# Patient Record
Sex: Female | Born: 1955 | Race: White | Hispanic: No | Smoking: Never smoker
Health system: Southern US, Community
[De-identification: ages and names within clinical notes are randomized; demographics above are authoritative.]

## PROBLEM LIST (undated history)

## (undated) DIAGNOSIS — G4700 Insomnia, unspecified: Secondary | ICD-10-CM

## (undated) DIAGNOSIS — D239 Other benign neoplasm of skin, unspecified: Secondary | ICD-10-CM

## (undated) DIAGNOSIS — N809 Endometriosis, unspecified: Secondary | ICD-10-CM

## (undated) DIAGNOSIS — F1010 Alcohol abuse, uncomplicated: Secondary | ICD-10-CM

## (undated) DIAGNOSIS — F329 Major depressive disorder, single episode, unspecified: Secondary | ICD-10-CM

## (undated) DIAGNOSIS — O24419 Gestational diabetes mellitus in pregnancy, unspecified control: Secondary | ICD-10-CM

## (undated) DIAGNOSIS — F32A Depression, unspecified: Secondary | ICD-10-CM

## (undated) DIAGNOSIS — K219 Gastro-esophageal reflux disease without esophagitis: Secondary | ICD-10-CM

## (undated) DIAGNOSIS — T8859XA Other complications of anesthesia, initial encounter: Secondary | ICD-10-CM

## (undated) DIAGNOSIS — F419 Anxiety disorder, unspecified: Secondary | ICD-10-CM

## (undated) HISTORY — PX: OOPHORECTOMY: SHX86

## (undated) HISTORY — DX: Major depressive disorder, single episode, unspecified: F32.9

## (undated) HISTORY — DX: Anxiety disorder, unspecified: F41.9

## (undated) HISTORY — DX: Depression, unspecified: F32.A

## (undated) HISTORY — DX: Other benign neoplasm of skin, unspecified: D23.9

## (undated) HISTORY — DX: Endometriosis, unspecified: N80.9

## (undated) HISTORY — PX: OTHER SURGICAL HISTORY: SHX169

## (undated) HISTORY — PX: RHINOPLASTY: SUR1284

## (undated) HISTORY — PX: GASTRIC BYPASS: SHX52

---

## 2007-06-04 ENCOUNTER — Ambulatory Visit: Payer: Self-pay | Admitting: Family Medicine

## 2009-02-09 ENCOUNTER — Ambulatory Visit: Payer: Self-pay | Admitting: Family Medicine

## 2009-04-25 ENCOUNTER — Ambulatory Visit: Payer: Self-pay | Admitting: Gastroenterology

## 2013-01-08 ENCOUNTER — Ambulatory Visit: Payer: Self-pay | Admitting: Neurology

## 2013-02-05 DIAGNOSIS — Z005 Encounter for examination of potential donor of organ and tissue: Secondary | ICD-10-CM | POA: Insufficient documentation

## 2013-02-05 DIAGNOSIS — Z9884 Bariatric surgery status: Secondary | ICD-10-CM | POA: Insufficient documentation

## 2013-02-05 DIAGNOSIS — Z01818 Encounter for other preprocedural examination: Secondary | ICD-10-CM | POA: Insufficient documentation

## 2014-04-27 ENCOUNTER — Ambulatory Visit: Payer: Self-pay | Admitting: Family Medicine

## 2015-04-24 ENCOUNTER — Emergency Department
Admission: EM | Admit: 2015-04-24 | Discharge: 2015-04-24 | Disposition: A | Discharge status: Home / Self Care | Payer: BC Managed Care – PPO | Attending: Emergency Medicine | Admitting: Emergency Medicine

## 2015-04-24 ENCOUNTER — Emergency Department: Payer: BC Managed Care – PPO

## 2015-04-24 ENCOUNTER — Encounter: Payer: Self-pay | Admitting: Emergency Medicine

## 2015-04-24 DIAGNOSIS — Y998 Other external cause status: Secondary | ICD-10-CM | POA: Insufficient documentation

## 2015-04-24 DIAGNOSIS — S0012XA Contusion of left eyelid and periocular area, initial encounter: Secondary | ICD-10-CM | POA: Diagnosis not present

## 2015-04-24 DIAGNOSIS — S060X0A Concussion without loss of consciousness, initial encounter: Secondary | ICD-10-CM

## 2015-04-24 DIAGNOSIS — Y92090 Kitchen in other non-institutional residence as the place of occurrence of the external cause: Secondary | ICD-10-CM | POA: Insufficient documentation

## 2015-04-24 DIAGNOSIS — Y9389 Activity, other specified: Secondary | ICD-10-CM | POA: Insufficient documentation

## 2015-04-24 DIAGNOSIS — W01198A Fall on same level from slipping, tripping and stumbling with subsequent striking against other object, initial encounter: Secondary | ICD-10-CM | POA: Diagnosis not present

## 2015-04-24 DIAGNOSIS — S0083XA Contusion of other part of head, initial encounter: Secondary | ICD-10-CM | POA: Diagnosis not present

## 2015-04-24 DIAGNOSIS — S0990XA Unspecified injury of head, initial encounter: Secondary | ICD-10-CM | POA: Diagnosis present

## 2015-04-24 HISTORY — DX: Insomnia, unspecified: G47.00

## 2015-04-24 NOTE — Discharge Instructions (Signed)
You evaluated after head injury with swelling to the eyelid, and found to have symptoms of a mild concussion as well as bruising to the face. He may take over-the-counter ibuprofen 600 mg every 8 hours as needed for headache and swelling.  Return to the emergency department for any new or worsening condition including any confusion, altered mental status, weakness, numbness, worsening pain, or vision problems.   Contusion A contusion is a deep bruise. Contusions are the result of a blunt injury to tissues and muscle fibers under the skin. The injury causes bleeding under the skin. The skin overlying the contusion may turn blue, purple, or yellow. Minor injuries will give you a painless contusion, but more severe contusions may stay painful and swollen for a few weeks.  CAUSES  This condition is usually caused by a blow, trauma, or direct force to an area of the body. SYMPTOMS  Symptoms of this condition include:  Swelling of the injured area.  Pain and tenderness in the injured area.  Discoloration. The area may have redness and then turn blue, purple, or yellow. DIAGNOSIS  This condition is diagnosed based on a physical exam and medical history. An X-ray, CT scan, or MRI may be needed to determine if there are any associated injuries, such as broken bones (fractures). TREATMENT  Specific treatment for this condition depends on what area of the body was injured. In general, the best treatment for a contusion is resting, icing, applying pressure to (compression), and elevating the injured area. This is often called the RICE strategy. Over-the-counter anti-inflammatory medicines may also be recommended for pain control.  HOME CARE INSTRUCTIONS   Rest the injured area.  If directed, apply ice to the injured area:  Put ice in a plastic bag.  Place a towel between your skin and the bag.  Leave the ice on for 20 minutes, 2-3 times per day.  If directed, apply light compression to the  injured area using an elastic bandage. Make sure the bandage is not wrapped too tightly. Remove and reapply the bandage as directed by your health care provider.  If possible, raise (elevate) the injured area above the level of your heart while you are sitting or lying down.  Take over-the-counter and prescription medicines only as told by your health care provider. SEEK MEDICAL CARE IF:  Your symptoms do not improve after several days of treatment.  Your symptoms get worse.  You have difficulty moving the injured area. SEEK IMMEDIATE MEDICAL CARE IF:   You have severe pain.  You have numbness in a hand or foot.  Your hand or foot turns pale or cold.   This information is not intended to replace advice given to you by your health care provider. Make sure you discuss any questions you have with your health care provider.   Document Released: 04/04/2005 Document Revised: 03/16/2015 Document Reviewed: 11/10/2014 Elsevier Interactive Patient Education 2016 [place], Adult A concussion is a brain injury. It is caused by:  A hit to the head.  A quick and sudden movement (jolt) of the head or neck. A concussion is usually not life threatening. Even so, it can cause serious problems. If you had a concussion before, you may have concussion-like problems after a hit to your head. HOME CARE General Instructions  Follow your doctor's directions carefully.  Take medicines only as told by your doctor.  Only take medicines your doctor says are safe.  Do not drink alcohol until your doctor says  it is okay. Alcohol and some drugs can slow down healing. They can also put you at risk for further injury.  If you are having trouble remembering things, write them down.  Try to do one thing at a time if you get distracted easily. For example, do not watch TV while making dinner.  Talk to your family members or close friends when making important decisions.  Follow up with  your doctor as told.  Watch your symptoms. Tell others to do the same. Serious problems can sometimes happen after a concussion. Older adults are more likely to have these problems.  Tell your teachers, school nurse, school counselor, coach, Product/process development scientist, or work Freight forwarder about your concussion. Tell them about what you can or cannot do. They should watch to see if:  It gets even harder for you to pay attention or concentrate.  It gets even harder for you to remember things or learn new things.  You need more time than normal to finish things.  You become annoyed (irritable) more than before.  You are not able to deal with stress as well.  You have more problems than before.  Rest. Make sure you:  Get plenty of sleep at night.  Go to sleep early.  Go to bed at the same time every day. Try to wake up at the same time.  Rest during the day.  Take naps when you feel tired.  Limit activities where you have to think a lot or concentrate. These include:  Doing homework.  Doing work related to a job.  Watching TV.  Using the computer. Returning To Your Regular Activities Return to your normal activities slowly, not all at once. You must give your body and brain enough time to heal.   Do not play sports or do other athletic activities until your doctor says it is okay.  Ask your doctor when you can drive, ride a bicycle, or work other vehicles or machines. Never do these things if you feel dizzy.  Ask your doctor about when you can return to work or school. Preventing Another Concussion It is very important to avoid another brain injury, especially before you have healed. In rare cases, another injury can lead to permanent brain damage, brain swelling, or death. The risk of this is greatest during the first 7-10 days after your injury. Avoid injuries by:   Wearing a seat belt when riding in a car.  Not drinking too much alcohol.  Avoiding activities that could lead to  a second concussion (such as contact sports).  Wearing a helmet when doing activities like:  Biking.  Skiing.  Skateboarding.  Skating.  Making your home safer by:  Removing things from the floor or stairways that could make you trip.  Using grab bars in bathrooms and handrails by stairs.  Placing non-slip mats on floors and in bathtubs.  Improve lighting in dark areas. GET HELP IF:  It gets even harder for you to pay attention or concentrate.  It gets even harder for you to remember things or learn new things.  You need more time than normal to finish things.  You become annoyed (irritable) more than before.  You are not able to deal with stress as well.  You have more problems than before.  You have problems keeping your balance.  You are not able to react quickly when you should. Get help if you have any of these problems for more than 2 weeks:   Lasting (chronic)  headaches.  Dizziness or trouble balancing.  Feeling sick to your stomach (nausea).  Seeing (vision) problems.  Being affected by noises or light more than normal.  Feeling sad, low, down in the dumps, blue, gloomy, or empty (depressed).  Mood changes (mood swings).  Feeling of fear or nervousness about what may happen (anxiety).  Feeling annoyed.  Memory problems.  Problems concentrating or paying attention.  Sleep problems.  Feeling tired all the time. GET HELP RIGHT AWAY IF:   You have bad headaches or your headaches get worse.  You have weakness (even if it is in one hand, leg, or part of the face).  You have loss of feeling (numbness).  You feel off balance.  You keep throwing up (vomiting).  You feel tired.  One black center of your eye (pupil) is larger than the other.  You twitch or shake violently (convulse).  Your speech is not clear (slurred).  You are more confused, easily angered (agitated), or annoyed than before.  You have more trouble resting than  before.  You are unable to recognize people or places.  You have neck pain.  It is difficult to wake you up.  You have unusual behavior changes.  You pass out (lose consciousness). MAKE SURE YOU:   Understand these instructions.  Will watch your condition.  Will get help right away if you are not doing well or get worse.   This information is not intended to replace advice given to you by your health care provider. Make sure you discuss any questions you have with your health care provider.   Document Released: 06/13/2009 Document Revised: 07/16/2014 Document Reviewed: 01/15/2013 Elsevier Interactive Patient Education [employer].

## 2015-04-24 NOTE — ED Provider Notes (Signed)
[facility] Emergency Department Provider Note   ____________________________________________  Time seen: On arrival to ED room I have reviewed the triage vital signs and the triage nursing note.  HISTORY  Chief Complaint Dizziness   Historian Patient  HPI Gabrielle Malone is a 59 y.o. female who is here for evaluation of head/facial injury which occurred last night overnight. Patient reports she slipped on a wet floor in the kitchen and struck her face on to a cabinet. She thinks that she blacked out for a few seconds. She states she went to sleep after looking in the mirror and things looked okay. When she woke up she states that she's been feeling generalized weakness all day long, and can't open her eyelid due to swelling. She states she had a headache as well as dizziness. She statesthat she's felt "off balance."    Past Medical History  Diagnosis Date  . Insomnia     There are no active problems to display for this patient.   Past Surgical History  Procedure Laterality Date  . Gastric bypass    . Cesarean section    . Rhinoplasty    . Carpal tunnel    . Oophorectomy      No current outpatient prescriptions on file.  Allergies Review of patient's allergies indicates no known allergies.  No family history on file.  Social History Social History  Substance Use Topics  . Smoking status: Never Smoker   . Smokeless tobacco: Never Used  . Alcohol Use: Yes     Comment: occas.    Review of Systems  Constitutional: Negative for fever. Eyes:questionable blurry vision. ENT: Negative for sore throat. Cardiovascular: Negative for chest pain. Respiratory: Negative for shortness of breath. Gastrointestinal: Negative for abdominal pain, vomiting and diarrhea. Genitourinary: Negative for dysuria. Musculoskeletal: Negative for back pain. Skin: Negative for rash. Neurological: as per history of present illness. 10 point Review of Systems  otherwise negative ____________________________________________   PHYSICAL EXAM:  VITAL SIGNS: ED Triage Vitals  Enc Vitals Group     BP 04/24/15 1558 149/92 mmHg     Pulse Rate 04/24/15 1558 93     Resp 04/24/15 1558 18     Temp 04/24/15 1558 98.2 F (36.8 C)     Temp Source 04/24/15 1558 Oral     SpO2 04/24/15 1558 99 %     Weight 04/24/15 1558 140 lb (63.504 kg)     Height 04/24/15 1558 5' 1.5" (1.562 m)     Head Cir --      Peak Flow --      Pain Score 04/24/15 1559 2     Pain Loc --      Pain Edu? --      Excl. in [place]? --      Constitutional: Alert and oriented. Well appearing and in no distress. Eyes: Conjunctivae are normal. PERRL. Normal extraocular movements. ENT   Head: hematoma and ecchymosis at the left eyebrow margin with large ecchymosis into the left eyelid, precluding her from opening on her own. no pain on palpation of the facial bones.   Nose: No congestion/rhinnorhea.   Mouth/Throat: Mucous membranes are moist.   Neck: No stridor.no midline C-spine tenderness. Cardiovascular/Chest: Normal rate, regular rhythm.  No murmurs, rubs, or gallops. Respiratory: Normal respiratory effort without tachypnea nor retractions. Breath sounds are clear and equal bilaterally. No wheezes/rales/rhonchi. Gastrointestinal: Soft. No distention, no guarding, no rebound. Nontender   Genitourinary/rectal:Deferred Musculoskeletal: Nontender with normal range of motion in all  extremities. No joint effusions.  No lower extremity tenderness.  No edema. Neurologic:  Normal speech and language. No gross or focal neurologic deficits are appreciated. Skin:  Skin is warm, dry and intact. No rash noted. Psychiatric: Mood and affect are normal. Speech and behavior are normal. Patient exhibits appropriate insight and judgment.  ____________________________________________   EKG I, Lisa Roca, MD, the attending physician have personally viewed and interpreted all ECGs.  No  EKG performed ____________________________________________  LABS (pertinent positives/negatives)  none  ____________________________________________  RADIOLOGY All Xrays were viewed by me. Imaging interpreted by Radiologist.  CT head noncontrast:   IMPRESSION: No acute intracranial findings. Mild chronic small vessel disease. __________________________________________  PROCEDURES  Procedure(s) performed: None  Critical Care performed: None  ____________________________________________   ED COURSE / ASSESSMENT AND PLAN  CONSULTATIONS: None  Pertinent labs & imaging results that were available during my care of the patient were reviewed by me and considered in my medical decision making (see chart for details).   The ecchymosis has gathered by gravity into the left eyelid, however it appears that the initial injury was on the frontal area of the eyebrow. When I lift her eyelids that she can see, her extremities are intact and she states her vision is fine. I'm not concerned about ocular injury. She is concerned about driving because of her one eye unable to open. I will write her an out of work note so she doesn't have to drive while she cannot open her left eye.  Because of her complaint of feeling off-balance, headache, nausea, after the head injury, we discussed obtaining a CT of the head to rule injured cranial trauma.  CT showed no acute trauma. Her symptoms may be related to mild concussion. Asher follow up with her primary care physician for this.  Patient / Family / Caregiver informed of clinical course, medical decision-making process, and agree with plan.   I discussed return precautions, follow-up instructions, and discharged instructions with patient and/or family.  ___________________________________________   FINAL CLINICAL IMPRESSION(S) / ED DIAGNOSES   Final diagnoses:  Hematoma of face, initial encounter  Concussion, without loss of  consciousness, initial encounter       Lisa Roca, MD 04/24/15 1727

## 2015-04-24 NOTE — ED Notes (Signed)
Pt reports tripping last night on a wet floor. Bruising and swelling over left eye. Pt states she thinks she "blacked out for a while". fall unwitnessed. Pt c/o dizziness and blurry vision in both eyes.

## 2015-08-11 ENCOUNTER — Encounter: Payer: Self-pay | Admitting: Emergency Medicine

## 2015-08-11 ENCOUNTER — Emergency Department: Payer: BC Managed Care – PPO

## 2015-08-11 ENCOUNTER — Emergency Department
Admission: EM | Admit: 2015-08-11 | Discharge: 2015-08-11 | Disposition: A | Discharge status: Home / Self Care | Payer: BC Managed Care – PPO | Attending: Emergency Medicine | Admitting: Emergency Medicine

## 2015-08-11 DIAGNOSIS — Y9389 Activity, other specified: Secondary | ICD-10-CM | POA: Diagnosis not present

## 2015-08-11 DIAGNOSIS — S0990XA Unspecified injury of head, initial encounter: Secondary | ICD-10-CM | POA: Insufficient documentation

## 2015-08-11 DIAGNOSIS — Y92410 Unspecified street and highway as the place of occurrence of the external cause: Secondary | ICD-10-CM | POA: Diagnosis not present

## 2015-08-11 DIAGNOSIS — S3991XA Unspecified injury of abdomen, initial encounter: Secondary | ICD-10-CM | POA: Insufficient documentation

## 2015-08-11 DIAGNOSIS — R55 Syncope and collapse: Secondary | ICD-10-CM | POA: Diagnosis present

## 2015-08-11 DIAGNOSIS — R112 Nausea with vomiting, unspecified: Secondary | ICD-10-CM | POA: Insufficient documentation

## 2015-08-11 DIAGNOSIS — S4992XA Unspecified injury of left shoulder and upper arm, initial encounter: Secondary | ICD-10-CM | POA: Insufficient documentation

## 2015-08-11 DIAGNOSIS — Y998 Other external cause status: Secondary | ICD-10-CM | POA: Insufficient documentation

## 2015-08-11 DIAGNOSIS — S060X0A Concussion without loss of consciousness, initial encounter: Secondary | ICD-10-CM | POA: Diagnosis not present

## 2015-08-11 DIAGNOSIS — S00412A Abrasion of left ear, initial encounter: Secondary | ICD-10-CM

## 2015-08-11 MED ORDER — METHOCARBAMOL 750 MG PO TABS: 750.0000 mg | ORAL_TABLET | Freq: Four times a day (QID) | ORAL | Status: DC

## 2015-08-11 MED ORDER — TRAMADOL HCL 50 MG PO TABS: 50.0000 mg | ORAL_TABLET | Freq: Four times a day (QID) | ORAL | Status: DC | PRN

## 2015-08-11 MED ORDER — IBUPROFEN 800 MG PO TABS: 800.0000 mg | ORAL_TABLET | Freq: Three times a day (TID) | ORAL | Status: DC | PRN
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
## 2015-08-11 NOTE — ED Notes (Signed)
Assessed per PA 

## 2015-08-11 NOTE — Discharge Instructions (Signed)
Concussion, Adult °A concussion is a brain injury. It is caused by: °· A hit to the head. °· A quick and sudden movement (jolt) of the head or neck. °A concussion is usually not life threatening. Even so, it can cause serious problems. If you had a concussion before, you may have concussion-like problems after a hit to your head. °HOME CARE °General Instructions °· Follow your doctor's directions carefully. °· Take medicines only as told by your doctor. °· Only take medicines your doctor says are safe. °· Do not drink alcohol until your doctor says it is okay. Alcohol and some drugs can slow down healing. They can also put you at risk for further injury. °· If you are having trouble remembering things, write them down. °· Try to do one thing at a time if you get distracted easily. For example, do not watch TV while making dinner. °· Talk to your family members or close friends when making important decisions. °· Follow up with your doctor as told. °· Watch your symptoms. Tell others to do the same. Serious problems can sometimes happen after a concussion. Older adults are more likely to have these problems. °· Tell your teachers, school nurse, school counselor, coach, athletic trainer, or work manager about your concussion. Tell them about what you can or cannot do. They should watch to see if: °¨ It gets even harder for you to pay attention or concentrate. °¨ It gets even harder for you to remember things or learn new things. °¨ You need more time than normal to finish things. °¨ You become annoyed (irritable) more than before. °¨ You are not able to deal with stress as well. °¨ You have more problems than before. °· Rest. Make sure you: °¨ Get plenty of sleep at night. °¨ Go to sleep early. °¨ Go to bed at the same time every day. Try to wake up at the same time. °¨ Rest during the day. °¨ Take naps when you feel tired. °· Limit activities where you have to think a lot or concentrate. These include: °¨ Doing  homework. °¨ Doing work related to a job. °¨ Watching TV. °¨ Using the computer. °Returning To Your Regular Activities °Return to your normal activities slowly, not all at once. You must give your body and brain enough time to heal.  °· Do not play sports or do other athletic activities until your doctor says it is okay. °· Ask your doctor when you can drive, ride a bicycle, or work other vehicles or machines. Never do these things if you feel dizzy. °· Ask your doctor about when you can return to work or school. °Preventing Another Concussion °It is very important to avoid another brain injury, especially before you have healed. In rare cases, another injury can lead to permanent brain damage, brain swelling, or death. The risk of this is greatest during the first 7-10 days after your injury. Avoid injuries by:  °· Wearing a seat belt when riding in a car. °· Not drinking too much alcohol. °· Avoiding activities that could lead to a second concussion (such as contact sports). °· Wearing a helmet when doing activities like: °¨ Biking. °¨ Skiing. °¨ Skateboarding. °¨ Skating. °· Making your home safer by: °¨ Removing things from the floor or stairways that could make you trip. °¨ Using grab bars in bathrooms and handrails by stairs. °¨ Placing non-slip mats on floors and in bathtubs. °¨ Improve lighting in dark areas. °GET HELP IF: °·   It gets even harder for you to pay attention or concentrate.  It gets even harder for you to remember things or learn new things.  You need more time than normal to finish things.  You become annoyed (irritable) more than before.  You are not able to deal with stress as well.  You have more problems than before.  You have problems keeping your balance.  You are not able to react quickly when you should. Get help if you have any of these problems for more than 2 weeks:   Lasting (chronic) headaches.  Dizziness or trouble balancing.  Feeling sick to your stomach  (nausea).  Seeing (vision) problems.  Being affected by noises or light more than normal.  Feeling sad, low, down in the dumps, blue, gloomy, or empty (depressed).  Mood changes (mood swings).  Feeling of fear or nervousness about what may happen (anxiety).  Feeling annoyed.  Memory problems.  Problems concentrating or paying attention.  Sleep problems.  Feeling tired all the time. GET HELP RIGHT AWAY IF:   You have bad headaches or your headaches get worse.  You have weakness (even if it is in one hand, leg, or part of the face).  You have loss of feeling (numbness).  You feel off balance.  You keep throwing up (vomiting).  You feel tired.  One black center of your eye (pupil) is larger than the other.  You twitch or shake violently (convulse).  Your speech is not clear (slurred).  You are more confused, easily angered (agitated), or annoyed than before.  You have more trouble resting than before.  You are unable to recognize people or places.  You have neck pain.  It is difficult to wake you up.  You have unusual behavior changes.  You pass out (lose consciousness). MAKE SURE YOU:   Understand these instructions.  Will watch your condition.  Will get help right away if you are not doing well or get worse.   This information is not intended to replace advice given to you by your health care provider. Make sure you discuss any questions you have with your health care provider.   Document Released: 06/13/2009 Document Revised: 07/16/2014 Document Reviewed: 01/15/2013 Elsevier Interactive Patient Education 2016 [place].  Abrasion An abrasion is a cut or scrape on the surface of your skin. An abrasion does not go through all of the layers of your skin. It is important to take good care of your abrasion to prevent infection. HOME CARE Medicines  Take or apply medicines only as told by your doctor.  If you were prescribed an antibiotic  ointment, finish all of it even if you start to feel better. Wound Care  Clean the wound with mild soap and water 2-3 times per day or as told by your doctor. Pat your wound dry with a clean towel. Do not rub it.  There are many ways to close and cover a wound. Follow instructions from your doctor about:  How to take care of your wound.  When and how you should change your bandage (dressing).  When and how you should take off your dressing.  Check your wound every day for signs of infection. Watch for:  Redness, swelling, or pain.  Fluid, blood, or pus. General Instructions  Keep the dressing dry as told by your doctor. Do not take baths, swim, use a hot tub, or do anything that would put your wound underwater until your doctor says it is okay.  If there is swelling, raise (elevate) the injured area above the level of your heart while you are sitting or lying down.  Keep all follow-up visits as told by your doctor. This is important. GET HELP IF:  You were given a tetanus shot and you have any of these where the needle went in:  Swelling.  Very bad pain.  Redness.  Bleeding.  Medicine does not help your pain.  You have any of these at the site of the wound:  More redness.  More swelling.  More pain. GET HELP RIGHT AWAY IF:  You have a red streak going away from your wound.  You have a fever.  You have fluid, blood, or pus coming from your wound.  There is a bad smell coming from your wound.   This information is not intended to replace advice given to you by your health care provider. Make sure you discuss any questions you have with your health care provider.   Document Released: 12/12/2007 Document Revised: 11/09/2014 Document Reviewed: 06/23/2014 Elsevier Interactive Patient Education [employer].

## 2015-09-06 ENCOUNTER — Other Ambulatory Visit: Payer: Self-pay | Admitting: Neurology

## 2015-09-06 DIAGNOSIS — F0781 Postconcussional syndrome: Secondary | ICD-10-CM

## 2015-09-26 ENCOUNTER — Ambulatory Visit: Payer: BC Managed Care – PPO

## 2015-09-30 ENCOUNTER — Ambulatory Visit
Admission: RE | Admit: 2015-09-30 | Discharge: 2015-09-30 | Disposition: A | Discharge status: Home / Self Care | Payer: BC Managed Care – PPO | Source: Ambulatory Visit | Attending: Neurology | Admitting: Neurology

## 2015-09-30 DIAGNOSIS — R9082 White matter disease, unspecified: Secondary | ICD-10-CM | POA: Insufficient documentation

## 2015-09-30 DIAGNOSIS — J320 Chronic maxillary sinusitis: Secondary | ICD-10-CM | POA: Insufficient documentation

## 2015-09-30 DIAGNOSIS — R413 Other amnesia: Secondary | ICD-10-CM | POA: Insufficient documentation

## 2015-09-30 DIAGNOSIS — F0781 Postconcussional syndrome: Secondary | ICD-10-CM | POA: Diagnosis present

## 2016-04-16 ENCOUNTER — Other Ambulatory Visit: Payer: Self-pay | Admitting: Family Medicine

## 2016-04-16 DIAGNOSIS — Z1231 Encounter for screening mammogram for malignant neoplasm of breast: Secondary | ICD-10-CM

## 2016-05-22 ENCOUNTER — Ambulatory Visit: Payer: BC Managed Care – PPO | Attending: Family Medicine

## 2017-04-23 ENCOUNTER — Ambulatory Visit (INDEPENDENT_AMBULATORY_CARE_PROVIDER_SITE_OTHER): Payer: BC Managed Care – PPO | Admitting: Internal Medicine

## 2017-04-23 ENCOUNTER — Encounter: Payer: Self-pay | Admitting: *Deleted

## 2017-04-23 VITALS — BP 126/80 | HR 84 | Resp 16 | Ht 62.0 in | Wt 132.0 lb

## 2017-04-23 DIAGNOSIS — F419 Anxiety disorder, unspecified: Secondary | ICD-10-CM | POA: Insufficient documentation

## 2017-04-23 DIAGNOSIS — N809 Endometriosis, unspecified: Secondary | ICD-10-CM | POA: Insufficient documentation

## 2017-04-23 DIAGNOSIS — R05 Cough: Secondary | ICD-10-CM

## 2017-04-23 DIAGNOSIS — N926 Irregular menstruation, unspecified: Secondary | ICD-10-CM | POA: Insufficient documentation

## 2017-04-23 DIAGNOSIS — J849 Interstitial pulmonary disease, unspecified: Secondary | ICD-10-CM

## 2017-04-23 DIAGNOSIS — F329 Major depressive disorder, single episode, unspecified: Secondary | ICD-10-CM | POA: Insufficient documentation

## 2017-04-23 DIAGNOSIS — IMO0001 Reserved for inherently not codable concepts without codable children: Secondary | ICD-10-CM | POA: Insufficient documentation

## 2017-04-23 DIAGNOSIS — R059 Cough, unspecified: Secondary | ICD-10-CM

## 2017-04-23 DIAGNOSIS — F32A Depression, unspecified: Secondary | ICD-10-CM | POA: Insufficient documentation

## 2017-04-23 DIAGNOSIS — O24419 Gestational diabetes mellitus in pregnancy, unspecified control: Secondary | ICD-10-CM | POA: Insufficient documentation

## 2017-04-23 MED ORDER — PREDNISONE 10 MG (21) PO TBPK: ORAL_TABLET | ORAL | 0 refills | Status: DC

## 2017-04-23 NOTE — Patient Instructions (Addendum)
--  Start omeprazole.   --Will do a trial a prednisone  --CT chest high resolution.   --Pulmonary function test.

## 2017-04-23 NOTE — Progress Notes (Signed)
[facility] Consultation      Assessment and Plan:  61 yo female with chronic cough, CXR shows suggestion of ILD.   Chronic cough.  --chronic dry cough, uncertain etiology.  --Agree with trial of omeprazole, instructed pt to go ahead and start this medication.   ILD-- rule out.  --Suggestion of ILD on CXR, no signs on physical exam.  --Will send for CT high-resolution.   GERD.  --As above, start omeprazole, which may cause ILD.   Orders Placed This Encounter  Procedures  . CT Chest High Resolution  . Pulmonary Function Test [facility] Only   Return in about 3 weeks (around 05/14/2017). (after ordered tests have been completed).     Date: 04/23/2017  MRN# [id number] Gabrielle Malone 01-08-1956  Referring Physician:   Inette Doubrava is a 61 y.o. old female seen in consultation for chief complaint of:    Chief Complaint  Patient presents with  . Advice Only    Referred by Dr. Pryor Ochoa -cough  . Cough    dry cough since May.    HPI:   The patient is a 61 yo female who presents with complaints of persistent cough which has been present for about 4 months. The cough is dry.  The cough come on as a cold and possible pneumonia. She has received 2 courses of abx and neither helped. She was referred to ENT, was recommended to start something for reflux. A CXR was suggested for ILD, then she was referred there.  She notes she gets some chest tightness and fatigue. She has diagnosed with OSA in 1999, a recheck after bariatric surgery showed that her test negative.   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 Her parents both smoked. 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  She does have symptoms of reflux occasionally. She does not have heart problems, she has never been diagnosed with respiratory problems in the past.  Her father had lung cancer. She  has never been a smoker.  She denies sinus drainage, they have 2 dogs.  They sleep in her bed.    02/04/13 CBC; Eos=130. 03/27/01; 7.45/35/114/26.   3/29/16CXR results : IMPRESSION: 1. No evidence of acute cardiopulmonary disease. 2. Obesity noted.  Review of outside records:  Patient was seen by ENT, for persistent cough, chest x-rays showed suggestion of chronic interstitial lung disease. The patient was treated with 2 rounds of oral antibiotics. She was thought to have GERD, and given a course of PPI.   PMHX:   Past Medical History:  Diagnosis Date  . Anxiety   . Depression   . Endometriosis   . Insomnia    Surgical Hx:  Past Surgical History:  Procedure Laterality Date  . carpal tunnel    . CESAREAN SECTION    . GASTRIC BYPASS    . OOPHORECTOMY    . RHINOPLASTY     Family Hx:  Family History  Problem Relation Age of Onset  . Dementia Mother   . Cancer Father        lung,,melanoma  . Dementia Maternal Grandmother    Social Hx:   Social History  Substance Use Topics  . Smoking status: Never Smoker  . Smokeless tobacco: Never Used  . Alcohol use Yes     Comment: occas.   Medication:    Current Outpatient Prescriptions:  .  busPIRone (  BUSPAR) 15 MG tablet, Take 15 mg by mouth 2 (two) times daily., Disp: , Rfl: 1 .  citalopram (CELEXA) 20 MG tablet, Take 20 mg by mouth daily., Disp: , Rfl: 1 .  fluticasone (FLONASE) 50 MCG/ACT nasal spray, Place 2 sprays into both nostrils daily., Disp: , Rfl:  .  LORazepam (ATIVAN) 1 MG tablet, Take 1 mg by mouth daily as needed., Disp: , Rfl: 0 .  Multiple Vitamin (MULTI-VITAMINS) TABS, Take 1 tablet by mouth daily., Disp: , Rfl:  .  omeprazole (PRILOSEC) 40 MG capsule, Take 40 mg by mouth daily., Disp: , Rfl:  .  zolpidem (AMBIEN) 10 MG tablet, Take 10 mg by mouth at bedtime., Disp: , Rfl: 0   Allergies:  Patient has no known allergies.  Review of Systems: Gen:  Denies  fever, sweats, chills HEENT: Denies blurred  vision, double vision. bleeds, sore throat Cvc:  No dizziness, chest pain. Resp:   Denies cough or sputum production, shortness of breath Gi: Denies swallowing difficulty, stomach pain. Gu:  Denies bladder incontinence, burning urine Ext:   No Joint pain, stiffness. Skin: No skin rash,  hives  Endoc:  No polyuria, polydipsia. Psych: No depression, insomnia. Other:  All other systems were reviewed with the patient and were negative other that what is mentioned in the HPI.   Physical Examination:   VS: BP 126/80 (BP Location: Right Arm, Cuff Size: Normal)   Pulse 84   Resp 16   Ht 5\' 2"  (1.575 m)   Wt 132 lb (59.9 kg)   SpO2 96%   BMI 24.14 kg/m   General Appearance: No distress  Neuro:without focal findings,  speech normal,  HEENT: PERRLA, EOM intact.   Pulmonary: Coarse breath sounds, No wheezing.  CardiovascularNormal S1,S2.  No m/r/g.   Abdomen: Benign, Soft, non-tender. Renal:  No costovertebral tenderness  GU:  No performed at this time. Endoc: No evident thyromegaly, no signs of acromegaly. Skin:   warm, no rashes, no ecchymosis  Extremities: normal, no cyanosis, clubbing.  Other findings:    LABORATORY PANEL:   CBC No results for input(s): WBC, HGB, HCT, PLT in the last 168 hours. ------------------------------------------------------------------------------------------------------------------  Chemistries  No results for input(s): NA, K, CL, CO2, GLUCOSE, BUN, CREATININE, CALCIUM, MG, AST, ALT, ALKPHOS, BILITOT in the last 168 hours.  Invalid input(s): GFRCGP ------------------------------------------------------------------------------------------------------------------  Cardiac Enzymes No results for input(s): TROPONINI in the last 168 hours. ------------------------------------------------------------  RADIOLOGY:  No results found.     Thank  you for the consultation and for allowing [facility] to assist in the care of  your patient. Our recommendations are noted above.  Please contact us if we can be of further service.   Marda Stalker, MD.  Board Certified in Internal Medicine, Pulmonary Medicine, [place], and Sleep Medicine.  [facility] Office Number: [phone]  Patricia Pesa, M.D.  Merton Border, M.D  04/23/2017

## 2017-05-09 ENCOUNTER — Ambulatory Visit: Payer: BC Managed Care – PPO | Attending: Internal Medicine

## 2017-05-09 ENCOUNTER — Ambulatory Visit
Admission: RE | Admit: 2017-05-09 | Discharge: 2017-05-09 | Disposition: A | Discharge status: Home / Self Care | Payer: BC Managed Care – PPO | Source: Ambulatory Visit | Attending: Internal Medicine | Admitting: Internal Medicine

## 2017-05-09 DIAGNOSIS — J849 Interstitial pulmonary disease, unspecified: Secondary | ICD-10-CM | POA: Insufficient documentation

## 2017-05-09 DIAGNOSIS — I2510 Atherosclerotic heart disease of native coronary artery without angina pectoris: Secondary | ICD-10-CM | POA: Diagnosis not present

## 2017-05-09 DIAGNOSIS — I700 Atherosclerosis of aorta: Secondary | ICD-10-CM | POA: Diagnosis not present

## 2017-05-09 DIAGNOSIS — R942 Abnormal results of pulmonary function studies: Secondary | ICD-10-CM | POA: Diagnosis not present

## 2017-05-09 DIAGNOSIS — R918 Other nonspecific abnormal finding of lung field: Secondary | ICD-10-CM | POA: Insufficient documentation

## 2017-05-09 DIAGNOSIS — Z9884 Bariatric surgery status: Secondary | ICD-10-CM | POA: Insufficient documentation

## 2017-05-09 DIAGNOSIS — K449 Diaphragmatic hernia without obstruction or gangrene: Secondary | ICD-10-CM | POA: Insufficient documentation

## 2017-05-16 NOTE — Progress Notes (Signed)
[facility] Consultation      Assessment and Plan:  61 yo female with chronic cough.  Chronic cough.  --chronic dry cough, uncertain etiology, however the cough appears to have improved with empiric treatment of Prilosec and Flonase. -Review of CT high-resolution showed no evidence of interstitial lung disease, review of primary function testing showed no evidence of abnormality completely normal pulmonary functions. -We discussed that overall her lungs appear to be normal, and I would recommend treating her cough symptomatically with over-the-counter cough medicines, and continue treatment with omeprazole and Flonase.  We discussed that she could try taking herself off 1 of these medications and see if her cough symptoms worsen. -She was given reassurance today that her lungs are normal and her cough is a symptom but there does not appear to be anything dangerous going on.  We discussed that sometimes last up in this process is undergoing a bronchoscopy which I think it would likely be unrevealing at this point as her CT chest was completely normal and she has no primary smoking history.  If she finds that the cough persists or worsens or she would otherwise like to take this last step she is asked to call us back and I would be happy to proceed with this.  GERD.  --Symptoms improved with empiric treatment of Prilosec. -As above we discussed she could trial of Flonase or Prilosec and see if her cough gets worse if it does then she should go back on. -I also asked her to be mindful about certain foods which may cause her cough to get worse.   Interstitial Lung Disease.  --Review of CT hi-res chest and PFT shows no evidence of ILD.      Date: 05/16/2017  MRN# [id number] Gabrielle Malone 1955-09-17    Gabrielle Malone is a 61 y.o. old female seen in consultation for chief complaint of:    Chief Complaint  Patient presents with  . Follow-up    CT chest & PFT:  SOB  w/activity: dry  cough    Synopsis: The patient presented with complaints of persistent cough which has been present for about 4 months. The cough is dry.  The cough come on as a cold and possible pneumonia. She has received 2 courses of abx and neither helped. She was referred to ENT, was recommended to start something for reflux.  She get a chest x-ray that was suggestive of interstitial lung disease, and was referred here.  She subsequently underwent a high-resolution CT chest which was negative for interstitial lung disease.  At last visit she was sent for a CT high-resolution and pulmonary function test as reviewed below, both of these were normal. She is taking prilosec daily and this helps with the reflux, which is now gone. Her husband is present and notes that the cough is improved but not gone. She also takes flonase  2 sprays in each nostril daily.   Imaging personally reviewed, CT chest 05/09/17; normal lungs, no evidence of interstitial lung disease.  Multiple bilateral tiny nodules, largest is 4 mm.   PFT, 05/09/17. -Spirometry FEV1 is 116% of predicted, FVC is 117% predicted, ratio is 81%.  No bronchodilator was given - TLC is 127% of predicted, ratio is normal.  Flow volume loop is within normal limits. - DLCO 229% of predicted. Overall this test shows normal pulmonary functions mildly increased TLC is suggestive of early emphysematous change, but overall this test is negative for COPD.  02/04/13 CBC; Eos=130. 03/27/01;  7.45/35/114/26.   3/29/16CXR results : IMPRESSION: 1. No evidence of acute cardiopulmonary disease. 2. Obesity noted.    Medication:    Current Outpatient Medications:  .  busPIRone (BUSPAR) 15 MG tablet, Take 15 mg by mouth 2 (two) times daily., Disp: , Rfl: 1 .  citalopram (CELEXA) 20 MG tablet, Take 20 mg by mouth daily., Disp: , Rfl: 1 .  fluticasone (FLONASE) 50 MCG/ACT nasal spray, Place 2 sprays into both nostrils daily., Disp: , Rfl:  .  LORazepam  (ATIVAN) 1 MG tablet, Take 1 mg by mouth daily as needed., Disp: , Rfl: 0 .  Multiple Vitamin (MULTI-VITAMINS) TABS, Take 1 tablet by mouth daily., Disp: , Rfl:  .  omeprazole (PRILOSEC) 40 MG capsule, Take 40 mg by mouth daily., Disp: , Rfl:  .  predniSONE (STERAPRED UNI-PAK 21 TAB) 10 MG (21) TBPK tablet, Take as directed., Disp: 1 tablet, Rfl: 0 .  zolpidem (AMBIEN) 10 MG tablet, Take 10 mg by mouth at bedtime., Disp: , Rfl: 0   Allergies:  Patient has no known allergies.  Review of Systems: Gen:  Denies  fever, sweats, chills HEENT: Denies blurred vision, double vision. bleeds, sore throat Cvc:  No dizziness, chest pain. Resp:   Denies cough or sputum production, shortness of breath Gi: Denies swallowing difficulty, stomach pain. Gu:  Denies bladder incontinence, burning urine Ext:   No Joint pain, stiffness. Skin: No skin rash,  hives  Endoc:  No polyuria, polydipsia. Psych: No depression, insomnia. Other:  All other systems were reviewed with the patient and were negative other that what is mentioned in the HPI.   Physical Examination:   VS: BP 106/80 (BP Location: Left Arm, Cuff Size: Normal)   Pulse 75   Ht 5\' 2"  (1.575 m)   Wt 136 lb (61.7 kg)   SpO2 95%   BMI 24.87 kg/m   General Appearance: No distress  Neuro:without focal findings,  speech normal,  HEENT: PERRLA, EOM intact.   Pulmonary: Coarse breath sounds, No wheezing.  CardiovascularNormal S1,S2.  No m/r/g.   Abdomen: Benign, Soft, non-tender. Renal:  No costovertebral tenderness  GU:  No performed at this time. Endoc: No evident thyromegaly, no signs of acromegaly. Skin:   warm, no rashes, no ecchymosis  Extremities: normal, no cyanosis, clubbing.  Other findings:    LABORATORY PANEL:   CBC No results for input(s): WBC, HGB, HCT, PLT in the last 168 hours. ------------------------------------------------------------------------------------------------------------------  Chemistries  No results  for input(s): NA, K, CL, CO2, GLUCOSE, BUN, CREATININE, CALCIUM, MG, AST, ALT, ALKPHOS, BILITOT in the last 168 hours.  Invalid input(s): GFRCGP ------------------------------------------------------------------------------------------------------------------  Cardiac Enzymes No results for input(s): TROPONINI in the last 168 hours. ------------------------------------------------------------  RADIOLOGY:  No results found.     Thank  you for the consultation and for allowing [facility] to assist in the care of your patient. Our recommendations are noted above.  Please contact us if we can be of further service.   Marda Stalker, MD.  Board Certified in Internal Medicine, Pulmonary Medicine, [place], and Sleep Medicine.  [facility] Office Number: [phone]  Patricia Pesa, M.D.  Merton Border, M.D  05/16/2017

## 2017-05-17 ENCOUNTER — Encounter: Payer: Self-pay | Admitting: Internal Medicine

## 2017-05-17 ENCOUNTER — Ambulatory Visit: Payer: BC Managed Care – PPO | Admitting: Internal Medicine

## 2017-05-17 VITALS — BP 106/80 | HR 75 | Ht 62.0 in | Wt 136.0 lb

## 2017-05-17 DIAGNOSIS — R059 Cough, unspecified: Secondary | ICD-10-CM

## 2017-05-17 DIAGNOSIS — R05 Cough: Secondary | ICD-10-CM

## 2017-05-17 NOTE — Patient Instructions (Signed)
--  Your tests show that your lungs are normal.   --Treat the symptoms of cough as needed with cough medicine.   --If you feel that your cough is getting worse or you would like to pursue other testing let us know and we can consider a bronchoscopy.

## 2017-05-25 ENCOUNTER — Emergency Department: Payer: BC Managed Care – PPO

## 2017-05-25 ENCOUNTER — Emergency Department
Admission: EM | Admit: 2017-05-25 | Discharge: 2017-05-25 | Disposition: A | Discharge status: Home / Self Care | Payer: BC Managed Care – PPO | Attending: Emergency Medicine | Admitting: Emergency Medicine

## 2017-05-25 DIAGNOSIS — Z79899 Other long term (current) drug therapy: Secondary | ICD-10-CM | POA: Diagnosis not present

## 2017-05-25 DIAGNOSIS — Y939 Activity, unspecified: Secondary | ICD-10-CM | POA: Diagnosis not present

## 2017-05-25 DIAGNOSIS — W010XXA Fall on same level from slipping, tripping and stumbling without subsequent striking against object, initial encounter: Secondary | ICD-10-CM | POA: Diagnosis not present

## 2017-05-25 DIAGNOSIS — F10920 Alcohol use, unspecified with intoxication, uncomplicated: Secondary | ICD-10-CM | POA: Diagnosis not present

## 2017-05-25 DIAGNOSIS — S098XXA Other specified injuries of head, initial encounter: Secondary | ICD-10-CM

## 2017-05-25 DIAGNOSIS — S0990XA Unspecified injury of head, initial encounter: Secondary | ICD-10-CM | POA: Diagnosis present

## 2017-05-25 DIAGNOSIS — Y999 Unspecified external cause status: Secondary | ICD-10-CM | POA: Insufficient documentation

## 2017-05-25 DIAGNOSIS — Y92002 Bathroom of unspecified non-institutional (private) residence single-family (private) house as the place of occurrence of the external cause: Secondary | ICD-10-CM | POA: Insufficient documentation

## 2017-05-25 LAB — COMPREHENSIVE METABOLIC PANEL
ALT: 19 U/L (ref 14–54)
AST: 23 U/L (ref 15–41)
Albumin: 4 g/dL (ref 3.5–5.0)
Alkaline Phosphatase: 77 U/L (ref 38–126)
Anion gap: 9 (ref 5–15)
BUN: 16 mg/dL (ref 6–20)
CHLORIDE: 104 mmol/L (ref 101–111)
CO2: 22 mmol/L (ref 22–32)
CREATININE: 0.79 mg/dL (ref 0.44–1.00)
Calcium: 8.9 mg/dL (ref 8.9–10.3)
Glucose, Bld: 90 mg/dL (ref 65–99)
POTASSIUM: 4 mmol/L (ref 3.5–5.1)
Sodium: 135 mmol/L (ref 135–145)
TOTAL PROTEIN: 6.9 g/dL (ref 6.5–8.1)
Total Bilirubin: 0.6 mg/dL (ref 0.3–1.2)

## 2017-05-25 LAB — CBC WITH DIFFERENTIAL/PLATELET
Basophils Absolute: 0.1 10*3/uL (ref 0–0.1)
Basophils Relative: 1 %
EOS ABS: 0.2 10*3/uL (ref 0–0.7)
EOS PCT: 3 %
HCT: 38.7 % (ref 35.0–47.0)
Hemoglobin: 13.3 g/dL (ref 12.0–16.0)
LYMPHS ABS: 1.8 10*3/uL (ref 1.0–3.6)
Lymphocytes Relative: 32 %
MCH: 32.3 pg (ref 26.0–34.0)
MCHC: 34.5 g/dL (ref 32.0–36.0)
MCV: 93.7 fL (ref 80.0–100.0)
MONO ABS: 0.7 10*3/uL (ref 0.2–0.9)
MONOS PCT: 11 %
NEUTROS ABS: 3 10*3/uL (ref 1.4–6.5)
Neutrophils Relative %: 53 %
PLATELETS: 236 10*3/uL (ref 150–440)
RBC: 4.13 MIL/uL (ref 3.80–5.20)
RDW: 14.2 % (ref 11.5–14.5)
WBC: 5.8 10*3/uL (ref 3.6–11.0)

## 2017-05-25 LAB — URINALYSIS, COMPLETE (UACMP) WITH MICROSCOPIC
Bacteria, UA: NONE SEEN
Bilirubin Urine: NEGATIVE
GLUCOSE, UA: NEGATIVE mg/dL
Hgb urine dipstick: NEGATIVE
Ketones, ur: NEGATIVE mg/dL
Leukocytes, UA: NEGATIVE
Nitrite: NEGATIVE
PH: 5 (ref 5.0–8.0)
PROTEIN: NEGATIVE mg/dL
SPECIFIC GRAVITY, URINE: 1.003 — AB (ref 1.005–1.030)
Squamous Epithelial / LPF: NONE SEEN

## 2017-05-25 LAB — URINE DRUG SCREEN, QUALITATIVE (ARMC ONLY)
Amphetamines, Ur Screen: NOT DETECTED
BARBITURATES, UR SCREEN: NOT DETECTED
Benzodiazepine, Ur Scrn: NOT DETECTED
CANNABINOID 50 NG, UR ~~LOC~~: NOT DETECTED
COCAINE METABOLITE, UR ~~LOC~~: NOT DETECTED
MDMA (ECSTASY) UR SCREEN: NOT DETECTED
METHADONE SCREEN, URINE: NOT DETECTED
OPIATE, UR SCREEN: NOT DETECTED
PHENCYCLIDINE (PCP) UR S: NOT DETECTED
TRICYCLIC, UR SCREEN: NOT DETECTED

## 2017-05-25 LAB — SALICYLATE LEVEL

## 2017-05-25 LAB — ETHANOL: ALCOHOL ETHYL (B): 219 mg/dL — AB (ref <10)

## 2017-05-25 LAB — ACETAMINOPHEN LEVEL: Acetaminophen (Tylenol), Serum: <10 ug/mL — ABNORMAL LOW (ref 10–30)

## 2017-05-25 NOTE — ED Notes (Signed)
Pt assisted to use bathroom, placed back in bed on cardiac monitor, husband at bedside and pt given warm blanket.

## 2017-05-25 NOTE — ED Notes (Signed)
Pt awakes easily by this rn. Dr. Joni Fears in to assess and discuss discharge with family and pt. Pt denies pain currently. Daughter and husband educated regarding s/s of head injury and s/s of need to return. Pt ambulatory to wheelchair.

## 2017-05-25 NOTE — ED Triage Notes (Addendum)
Patient had unwitnessed fall today. Spouse heard fall, did not see it. Patient has ETOH and possible nighttime sleeping pills on board. Patient continually falling asleep/falling forward during triage. Patient has laceration below hairline with moderate amount of dried blood in hair.

## 2017-05-25 NOTE — ED Provider Notes (Signed)
[facility] Emergency Department Provider Note  ____________________________________________  Time seen: Approximately 10:18 PM  I have reviewed the triage vital signs and the nursing notes.   HISTORY  Chief Complaint Head Injury and Fall (unwitnessed)  Level 5 Caveat: Portions of the History and Physical are unable to be obtained due to patient being a poor historian due to alcohol intoxication   HPI Gabrielle Malone is a 61 y.o. female brought to the ED due to a fall. Per family the patient and been drinking alcohol this evening and took her nighttime sleeping pills. She was in the bathroom and then lost her balance and fell. The patient states that she feels fine. She has some bleeding from the top of her scalp. Denies loss of consciousness neck pain and paresthesias or weakness. No preceding symptoms causing the fall. She feels that she just lost her balance.     Past Medical History:  Diagnosis Date  . Anxiety   . Depression   . Endometriosis   . Insomnia      Patient Active Problem List   Diagnosis Date Noted  . Anxiety 04/23/2017  . Depression 04/23/2017  . Gestational diabetes 04/23/2017  . Endometriosis 04/23/2017  . Menstrual problem 04/23/2017  . Status post normal childbirth 04/23/2017  . Physical exam, organ donor 02/05/2013  . Pre-op evaluation 02/05/2013  . 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     Past Surgical History:  Procedure Laterality Date  . carpal tunnel    . CESAREAN SECTION    . GASTRIC BYPASS    . OOPHORECTOMY    . RHINOPLASTY       Prior to Admission medications   Medication Sig Start Date End Date Taking? Authorizing Provider  busPIRone (BUSPAR) 15 MG tablet Take 15 mg by mouth 2 (two) times daily. 04/05/17   [provider]  citalopram (CELEXA) 20 MG tablet Take 20 mg by mouth daily. 04/05/17   [provider]  fluticasone (FLONASE) 50 MCG/ACT nasal spray Place 2 sprays into both nostrils daily.  04/20/17   [provider]  LORazepam (ATIVAN) 1 MG tablet Take 1 mg by mouth daily as needed. 04/08/17   [provider]  Multiple Vitamin (MULTI-VITAMINS) TABS Take 1 tablet by mouth daily.    [provider]  omeprazole (PRILOSEC) 40 MG capsule Take 40 mg by mouth daily. 04/20/17   [provider]  zolpidem (AMBIEN) 10 MG tablet Take 10 mg by mouth at bedtime. 04/08/17   [provider]     Allergies Patient has no known allergies.   Family History  Problem Relation Age of Onset  . Dementia Mother   . Cancer Father        lung,,melanoma  . Dementia Maternal Grandmother     Social History Social History   Tobacco Use  . Smoking status: Never Smoker  . Smokeless tobacco: Never Used  Substance Use Topics  . Alcohol use: Yes    Comment: occas.  . Drug use: No    Review of Systems  Constitutional:   No fever or chills.  ENT:   No sore throat. No rhinorrhea. Cardiovascular:   No chest pain or syncope. Respiratory:   No dyspnea or cough. Gastrointestinal:   Negative for abdominal pain, vomiting and diarrhea.  Musculoskeletal:   Negative for focal pain or swelling All other systems reviewed and are negative except as documented above in ROS and HPI.  ____________________________________________   PHYSICAL EXAM:  VITAL SIGNS: ED Triage Vitals  Enc Vitals Group     BP 05/25/17 2122 118/88     Pulse Rate 05/25/17 2122 69     Resp 05/25/17 2122 18     Temp 05/25/17 2122 97.9 F (36.6 C)     Temp Source 05/25/17 2122 Oral     SpO2 05/25/17 2122 99 %     Weight 05/25/17 2121 136 lb (61.7 kg)     Height --      Head Circumference --      Peak Flow --      Pain Score --      Pain Loc --      Pain Edu? --      Excl. in [place]? --     Vital signs reviewed, nursing assessments reviewed.   Constitutional:   Alert and oriented. Well appearing and in no distress. Eyes:   No scleral icterus.  EOMI. No nystagmus. No conjunctival  pallor. PERRL. ENT   Head:   Normocephalic with scalp contusion at the apex. No laceration.   Nose:   No congestion/rhinnorhea.    Mouth/Throat:   MMM, no pharyngeal erythema. No peritonsillar mass.    Neck:   No meningismus. Full ROM. No midline spinal tenderness. Hematological/Lymphatic/Immunilogical:   No cervical lymphadenopathy. Cardiovascular:   RRR. Symmetric bilateral radial and DP pulses.  No murmurs.  Respiratory:   Normal respiratory effort without tachypnea/retractions. Breath sounds are clear and equal bilaterally. No wheezes/rales/rhonchi. Gastrointestinal:   Soft and nontender. Non distended. There is no CVA tenderness.  No rebound, rigidity, or guarding. Genitourinary:   deferred Musculoskeletal:   Normal range of motion in all extremities. No joint effusions.  No lower extremity tenderness.  No edema. Neurologic:   Slurred speech. Normal language  Motor grossly intact. No gross focal neurologic deficits are appreciated.  Skin:    Skin is warm, dry and intact. No rash noted.  No petechiae, purpura, or bullae.  ____________________________________________    LABS (pertinent positives/negatives) (all labs ordered are listed, but only abnormal results are displayed) Labs Reviewed  ACETAMINOPHEN LEVEL - Abnormal; Notable for the following components:      Result Value   Acetaminophen (Tylenol), Serum <10 (*)    All other components within normal limits  ETHANOL - Abnormal; Notable for the following components:   Alcohol, Ethyl (B) 219 (*)    All other components within normal limits  URINALYSIS, COMPLETE (UACMP) WITH MICROSCOPIC - Abnormal; Notable for the following components:   Color, Urine COLORLESS (*)    APPearance CLEAR (*)    Specific Gravity, Urine 1.003 (*)    All other components within normal limits  COMPREHENSIVE METABOLIC PANEL  SALICYLATE LEVEL  CBC WITH DIFFERENTIAL/PLATELET  URINE DRUG SCREEN, QUALITATIVE ([facility] ONLY)    ____________________________________________   EKG  Interpreted by me Sinus rhythm rate of 67, normal axis and intervals. Normal QRS ST segments and T waves.  ____________________________________________    RADIOLOGY  Ct Head Wo Contrast  Result Date: 05/25/2017 CLINICAL DATA:  Unwitnessed fall today.  Laceration below hairline. EXAM: CT HEAD WITHOUT CONTRAST TECHNIQUE: Contiguous axial images were obtained from the base of the skull through the vertex without intravenous contrast. COMPARISON:  None. FINDINGS: Brain: No evidence of acute infarction, hemorrhage, hydrocephalus, extra-axial collection or mass lesion/mass effect. Chronic minimal small vessel ischemic disease. Vascular: No hyperdense vessel or unexpected calcification. Skull: Normal. Negative for fracture or focal lesion. Sinuses/Orbits: No acute finding. Other: Reported scalp laceration is not apparent. IMPRESSION: No acute intracranial abnormality. Stable  mild chronic small vessel ischemic disease. Electronically Signed   By: Ashley Royalty M.D.   On: 05/25/2017 22:59    ____________________________________________   PROCEDURES Procedures  ____________________________________________   DIFFERENTIAL DIAGNOSIS  Alcohol intoxication, concussion, subdural hematoma  CLINICAL IMPRESSION / ASSESSMENT AND PLAN / ED COURSE  Pertinent labs & imaging results that were available during my care of the patient were reviewed by me and considered in my medical decision making (see chart for details).   Patient overall well-appearing not in distress, presents with blunt head trauma in the setting of alcohol intoxication. Because history and physical is a bit unreliable in the setting of intoxication, patient will have a CT scan of the head to evaluate for intracranial hemorrhage. If negative, patient is suitable for discharge home with family. No other concerning symptoms at this time. Labs do show an alcohol level of 200 with  otherwise normal chemistries. No evidence of serious coingestion although patient is reported to have taken her Ambien as well.   Clinical Course as of May 25 2305  Sat May 25, 2017  2305 Ct negative. Stable for DC home.   [PS]    Clinical Course User Index [PS] Carrie Mew, MD     ____________________________________________   FINAL CLINICAL IMPRESSION(S) / ED DIAGNOSES    Final diagnoses:  Alcoholic intoxication without complication ([place])  Blunt head trauma, initial encounter      This SmartLink is deprecated. Use AVSMEDLIST instead to display the medication list for a patient.   Portions of this note were generated with dragon dictation software. Dictation errors may occur despite best attempts at proofreading.    Carrie Mew, MD 05/25/17 [phone]

## 2017-05-25 NOTE — Discharge Instructions (Signed)
Your lab tests and CT scan of the head were unremarkable today. Follow up with your doctor for continued monitoring of your symptoms if they have not resolved in the next 24 hours.

## 2018-09-11 ENCOUNTER — Other Ambulatory Visit: Payer: Self-pay | Admitting: Family Medicine

## 2018-09-12 ENCOUNTER — Other Ambulatory Visit: Payer: Self-pay | Admitting: Family Medicine

## 2018-09-12 DIAGNOSIS — Z1231 Encounter for screening mammogram for malignant neoplasm of breast: Secondary | ICD-10-CM

## 2018-09-22 ENCOUNTER — Other Ambulatory Visit: Payer: Self-pay

## 2018-09-22 ENCOUNTER — Ambulatory Visit
Admission: RE | Admit: 2018-09-22 | Discharge: 2018-09-22 | Disposition: A | Discharge status: Home / Self Care | Payer: BC Managed Care – PPO | Source: Ambulatory Visit | Attending: Family Medicine | Admitting: Family Medicine

## 2018-09-22 DIAGNOSIS — Z1231 Encounter for screening mammogram for malignant neoplasm of breast: Secondary | ICD-10-CM | POA: Diagnosis present

## 2018-09-25 ENCOUNTER — Other Ambulatory Visit: Payer: Self-pay | Admitting: Family Medicine

## 2018-09-25 DIAGNOSIS — N6489 Other specified disorders of breast: Secondary | ICD-10-CM

## 2018-09-25 DIAGNOSIS — R928 Other abnormal and inconclusive findings on diagnostic imaging of breast: Secondary | ICD-10-CM

## 2018-09-29 ENCOUNTER — Ambulatory Visit: Payer: BC Managed Care – PPO

## 2018-09-29 ENCOUNTER — Other Ambulatory Visit: Payer: BC Managed Care – PPO

## 2018-09-30 ENCOUNTER — Ambulatory Visit
Admission: RE | Admit: 2018-09-30 | Discharge: 2018-09-30 | Disposition: A | Discharge status: Home / Self Care | Payer: BC Managed Care – PPO | Source: Ambulatory Visit | Attending: Family Medicine | Admitting: Family Medicine

## 2018-09-30 ENCOUNTER — Other Ambulatory Visit: Payer: Self-pay

## 2018-09-30 DIAGNOSIS — R928 Other abnormal and inconclusive findings on diagnostic imaging of breast: Secondary | ICD-10-CM | POA: Diagnosis present

## 2018-09-30 DIAGNOSIS — N6489 Other specified disorders of breast: Secondary | ICD-10-CM | POA: Diagnosis present

## 2019-10-01 ENCOUNTER — Other Ambulatory Visit: Payer: Self-pay

## 2019-10-01 ENCOUNTER — Emergency Department
Admission: EM | Admit: 2019-10-01 | Discharge: 2019-10-02 | Disposition: A | Discharge status: Home / Self Care | Payer: BC Managed Care – PPO | Attending: Emergency Medicine | Admitting: Emergency Medicine

## 2019-10-01 DIAGNOSIS — Y999 Unspecified external cause status: Secondary | ICD-10-CM | POA: Diagnosis not present

## 2019-10-01 DIAGNOSIS — Z23 Encounter for immunization: Secondary | ICD-10-CM | POA: Diagnosis not present

## 2019-10-01 DIAGNOSIS — Z79899 Other long term (current) drug therapy: Secondary | ICD-10-CM | POA: Insufficient documentation

## 2019-10-01 DIAGNOSIS — F10920 Alcohol use, unspecified with intoxication, uncomplicated: Secondary | ICD-10-CM | POA: Diagnosis not present

## 2019-10-01 DIAGNOSIS — S0101XA Laceration without foreign body of scalp, initial encounter: Secondary | ICD-10-CM | POA: Insufficient documentation

## 2019-10-01 DIAGNOSIS — S0990XA Unspecified injury of head, initial encounter: Secondary | ICD-10-CM

## 2019-10-01 DIAGNOSIS — Y908 Blood alcohol level of 240 mg/100 ml or more: Secondary | ICD-10-CM | POA: Insufficient documentation

## 2019-10-01 DIAGNOSIS — Y92000 Kitchen of unspecified non-institutional (private) residence as  the place of occurrence of the external cause: Secondary | ICD-10-CM | POA: Diagnosis not present

## 2019-10-01 DIAGNOSIS — W01198A Fall on same level from slipping, tripping and stumbling with subsequent striking against other object, initial encounter: Secondary | ICD-10-CM | POA: Insufficient documentation

## 2019-10-01 DIAGNOSIS — Y9389 Activity, other specified: Secondary | ICD-10-CM | POA: Diagnosis not present

## 2019-10-01 LAB — CBC WITH DIFFERENTIAL/PLATELET
Abs Immature Granulocytes: 0.02 10*3/uL (ref 0.00–0.07)
Basophils Absolute: 0.1 10*3/uL (ref 0.0–0.1)
Basophils Relative: 1 %
Eosinophils Absolute: 0.2 10*3/uL (ref 0.0–0.5)
Eosinophils Relative: 2 %
HCT: 41.4 % (ref 36.0–46.0)
Hemoglobin: 13.9 g/dL (ref 12.0–15.0)
Immature Granulocytes: 0 %
Lymphocytes Relative: 43 %
Lymphs Abs: 3.5 10*3/uL (ref 0.7–4.0)
MCH: 32.8 pg (ref 26.0–34.0)
MCHC: 33.6 g/dL (ref 30.0–36.0)
MCV: 97.6 fL (ref 80.0–100.0)
Monocytes Absolute: 0.7 10*3/uL (ref 0.1–1.0)
Monocytes Relative: 9 %
Neutro Abs: 3.7 10*3/uL (ref 1.7–7.7)
Neutrophils Relative %: 45 %
Platelets: 232 10*3/uL (ref 150–400)
RBC: 4.24 MIL/uL (ref 3.87–5.11)
RDW: 12.6 % (ref 11.5–15.5)
WBC: 8.2 10*3/uL (ref 4.0–10.5)
nRBC: 0 % (ref 0.0–0.2)

## 2019-10-01 MED ORDER — SODIUM CHLORIDE 0.9 % IV BOLUS: 1000.0000 mL | Freq: Once | INTRAVENOUS | Status: DC

## 2019-10-01 MED ORDER — TETANUS-DIPHTH-ACELL PERTUSSIS 5-2.5-18.5 LF-MCG/0.5 IM SUSP
0.5000 mL | Freq: Once | INTRAMUSCULAR | Status: AC
  Administered 2019-10-01: 0.5 mL via INTRAMUSCULAR
  Filled 2019-10-01: qty 0.5

## 2019-10-01 NOTE — ED Provider Notes (Signed)
[facility] Emergency Department Provider Note   ____________________________________________   First MD Initiated Contact with Patient 10/01/19 2334     (approximate)  I have reviewed the triage vital signs and the nursing notes.   HISTORY  Chief Complaint Fall and Alcohol Intoxication  Level V caveat: Limited by intoxication  HPI Gabrielle Malone is a 64 y.o. female brought to the ED via EMS from home status post mechanical fall and head injury.  Patient endorses drinking 1 bottle of wine tonight.  She stumbled and fell in the kitchen, striking her posterior head on the stove.  Denies LOC.  EMS reports "flap" to the back of the head but patient would not allow them to fully examine her wounds.  Patient has her eyes closed and keeps repeating "I am okay, I am okay".  Unknown date of last tetanus.  Voices no complaints.  Denies headache, vision changes, neck pain, chest pain, shortness of breath, abdominal pain, nausea or vomiting.       Past Medical History:  Diagnosis Date  . Anxiety   . Depression   . Endometriosis   . Insomnia     Patient Active Problem List   Diagnosis Date Noted  . Anxiety 04/23/2017  . Depression 04/23/2017  . Gestational diabetes 04/23/2017  . Endometriosis 04/23/2017  . Menstrual problem 04/23/2017  . Status post normal childbirth 04/23/2017  . Physical exam, organ donor 02/05/2013  . Pre-op evaluation 02/05/2013  . 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    Past Surgical History:  Procedure Laterality Date  . carpal tunnel    . CESAREAN SECTION    . GASTRIC BYPASS    . OOPHORECTOMY    . RHINOPLASTY      Prior to Admission medications   Medication Sig Start Date End Date Taking? Authorizing Provider  busPIRone (BUSPAR) 15 MG tablet Take 15 mg by mouth 2 (two) times daily. 04/05/17   [provider]  citalopram (CELEXA) 20 MG tablet Take 20 mg by mouth daily. 04/05/17   [provider]  fluticasone (FLONASE)  50 MCG/ACT nasal spray Place 2 sprays into both nostrils daily. 04/20/17   [provider]  LORazepam (ATIVAN) 1 MG tablet Take 1 mg by mouth daily as needed. 04/08/17   [provider]  Multiple Vitamin (MULTI-VITAMINS) TABS Take 1 tablet by mouth daily.    [provider]  omeprazole (PRILOSEC) 40 MG capsule Take 40 mg by mouth daily. 04/20/17   [provider]  zolpidem (AMBIEN) 10 MG tablet Take 10 mg by mouth at bedtime. 04/08/17   [provider]    Allergies Patient has no known allergies.  Family History  Problem Relation Age of Onset  . Dementia Mother   . Cancer Father        lung,,melanoma  . Dementia Maternal Grandmother   . Breast cancer Maternal Grandmother     Social History Social History   Tobacco Use  . Smoking status: Never Smoker  . Smokeless tobacco: Never Used  Substance Use Topics  . Alcohol use: Yes  . Drug use: No    Review of Systems  Constitutional: Positive for intoxication.  No fever/chills Eyes: No visual changes. ENT: No sore throat. Cardiovascular: Denies chest pain. Respiratory: Denies shortness of breath. Gastrointestinal: No abdominal pain.  No nausea, no vomiting.  No diarrhea.  No constipation. Genitourinary: Negative for dysuria. Musculoskeletal: Negative for back pain. Skin: Negative for rash. Neurological: Positive for head injury.  Negative for headaches,  focal weakness or numbness.   ____________________________________________   PHYSICAL EXAM:  VITAL SIGNS: ED Triage Vitals  Enc Vitals Group     BP 10/01/19 2332 (!) 129/94     Pulse Rate 10/01/19 2332 97     Resp 10/01/19 2332 18     Temp 10/01/19 2332 97.9 F (36.6 C)     Temp Source 10/01/19 2332 Oral     SpO2 10/01/19 2332 100 %     Weight 10/01/19 2333 130 lb (59 kg)     Height 10/01/19 2333 5\' 1"  (1.549 m)     Head Circumference --      Peak Flow --      Pain Score 10/01/19 2333 0     Pain Loc --      Pain Edu?  --      Excl. in [place]? --     Constitutional: Alert and oriented.  Intoxicated appearing and in no acute distress. Eyes: Conjunctivae are normal. PERRL. EOMI. Head: Bloodied and matted left superior scalp.  Will reexamine after cleansing. Nose: Atraumatic. Mouth/Throat: Mucous membranes are moist.  No dental malocclusion. Neck: No stridor.  No cervical spine tenderness to palpation. Cardiovascular: Normal rate, regular rhythm. Grossly normal heart sounds.  Good peripheral circulation. Respiratory: Normal respiratory effort.  No retractions. Lungs CTAB. Gastrointestinal: Soft and nontender to light or deep palpation. No distention. No abdominal bruits. No CVA tenderness. Musculoskeletal: No lower extremity tenderness nor edema.  No joint effusions. Neurologic: Alert and oriented to person and place.  Normal speech and language. No gross focal neurologic deficits are appreciated. MAEx4. Skin:  Skin is warm, dry and intact. No rash noted. Psychiatric: Mood and affect are normal. Speech and behavior are normal.  ____________________________________________   LABS (all labs ordered are listed, but only abnormal results are displayed)  Labs Reviewed  COMPREHENSIVE METABOLIC PANEL - Abnormal; Notable for the following components:      Result Value   CO2 21 (*)    Glucose, Bld 186 (*)    Calcium 8.8 (*)    All other components within normal limits  ETHANOL - Abnormal; Notable for the following components:   Alcohol, Ethyl (B) 253 (*)    All other components within normal limits  CBC WITH DIFFERENTIAL/PLATELET   ____________________________________________  EKG  None ____________________________________________  RADIOLOGY  ED MD interpretation: No ICH, no acute cervical spine fracture/dislocation  Official radiology report(s): CT Head Wo Contrast  Result Date: 10/02/2019 CLINICAL DATA:  Fall at home EXAM: CT HEAD WITHOUT CONTRAST TECHNIQUE: Contiguous axial images were obtained  from the base of the skull through the vertex without intravenous contrast. COMPARISON:  None. FINDINGS: Brain: No evidence of acute territorial infarction, hemorrhage, hydrocephalus,extra-axial collection or mass lesion/mass effect. There is dilatation the ventricles and sulci consistent with age-related atrophy. Low-attenuation changes in the deep white matter consistent with small vessel ischemia. Vascular: No hyperdense vessel or unexpected calcification. Skull: The skull is intact. No fracture or focal lesion identified. Sinuses/Orbits: The visualized paranasal sinuses and mastoid air cells are clear. The orbits and globes intact. Other: Soft tissue hematoma with soft tissue swelling seen overlying the superior skull. Cervical spine: Alignment: There is a minimal anterolisthesis of C3 on C4. Skull base and vertebrae: Visualized skull base is intact. No atlanto-occipital dissociation. The vertebral body heights are well maintained. No fracture or pathologic osseous lesion seen. Soft tissues and spinal canal: The visualized paraspinal soft tissues are unremarkable. No prevertebral soft tissue swelling is seen. The spinal canal  is grossly unremarkable, no large epidural collection or significant canal narrowing. Disc levels: Cervical spine spondylosis is seen with anterior osteophytes, disc osteophyte complex and uncovertebral osteophytes most notable at C5-C6 and C6-C7. No significant canal stenosis however seen. Upper chest: The lung apices are clear. Thoracic inlet is within normal limits. Other: None IMPRESSION: No acute intracranial abnormality. Findings consistent with age related atrophy and chronic small vessel ischemia Soft tissue hematoma and swelling seen over the superior skull No acute fracture or malalignment of the spine. Electronically Signed   By: Prudencio Pair M.D.   On: 10/02/2019 00:29   CT Cervical Spine Wo Contrast  Result Date: 10/02/2019 CLINICAL DATA:  Fall at home EXAM: CT HEAD WITHOUT  CONTRAST TECHNIQUE: Contiguous axial images were obtained from the base of the skull through the vertex without intravenous contrast. COMPARISON:  None. FINDINGS: Brain: No evidence of acute territorial infarction, hemorrhage, hydrocephalus,extra-axial collection or mass lesion/mass effect. There is dilatation the ventricles and sulci consistent with age-related atrophy. Low-attenuation changes in the deep white matter consistent with small vessel ischemia. Vascular: No hyperdense vessel or unexpected calcification. Skull: The skull is intact. No fracture or focal lesion identified. Sinuses/Orbits: The visualized paranasal sinuses and mastoid air cells are clear. The orbits and globes intact. Other: Soft tissue hematoma with soft tissue swelling seen overlying the superior skull. Cervical spine: Alignment: There is a minimal anterolisthesis of C3 on C4. Skull base and vertebrae: Visualized skull base is intact. No atlanto-occipital dissociation. The vertebral body heights are well maintained. No fracture or pathologic osseous lesion seen. Soft tissues and spinal canal: The visualized paraspinal soft tissues are unremarkable. No prevertebral soft tissue swelling is seen. The spinal canal is grossly unremarkable, no large epidural collection or significant canal narrowing. Disc levels: Cervical spine spondylosis is seen with anterior osteophytes, disc osteophyte complex and uncovertebral osteophytes most notable at C5-C6 and C6-C7. No significant canal stenosis however seen. Upper chest: The lung apices are clear. Thoracic inlet is within normal limits. Other: None IMPRESSION: No acute intracranial abnormality. Findings consistent with age related atrophy and chronic small vessel ischemia Soft tissue hematoma and swelling seen over the superior skull No acute fracture or malalignment of the spine. Electronically Signed   By: Prudencio Pair M.D.   On: 10/02/2019 00:29     ____________________________________________   PROCEDURES  Procedure(s) performed (including Critical Care):  Marland KitchenMarland KitchenLaceration Repair  Date/Time: 10/02/2019 1:03 AM Performed by: Paulette Blanch, MD Authorized by: Paulette Blanch, MD   Consent:    Consent obtained:  Verbal   Consent given by:  Patient   Risks discussed:  Infection, pain, retained foreign body, poor cosmetic result and poor wound healing Anesthesia (see MAR for exact dosages):    Anesthesia method:  Topical application   Topical anesthesia: PainEase. Laceration details:    Location:  Scalp   Length (cm):  6   Depth (mm):  6 Repair type:    Repair type:  Simple Exploration:    Hemostasis achieved with:  Direct pressure   Wound exploration: entire depth of wound probed and visualized     Contaminated: no   Treatment:    Area cleansed with:  Saline   Amount of cleaning:  Standard   Irrigation solution:  Sterile saline   Visualized foreign bodies/material removed: no   Skin repair:    Repair method:  Staples   Number of staples:  6 Approximation:    Approximation:  Loose Post-procedure details:    Dressing:  Sterile dressing   Patient tolerance of procedure:  Tolerated well, no immediate complications     ____________________________________________   INITIAL IMPRESSION / ASSESSMENT AND PLAN / ED COURSE  As part of my medical decision making, I reviewed the following data within the [facility] notes reviewed and incorporated, Labs reviewed, Old chart reviewed, Radiograph reviewed and Notes from prior ED visits     Selita Staiger was evaluated in Emergency Department on 10/02/2019 for the symptoms described in the history of present illness. She was evaluated in the context of the global COVID-19 pandemic, which necessitated consideration that the patient might be at risk for infection with the SARS-CoV-2 virus that causes COVID-19. Institutional protocols and algorithms that pertain  to the evaluation of patients at risk for COVID-19 are in a state of rapid change based on information released by regulatory bodies including the CDC and federal and state organizations. These policies and algorithms were followed during the patient's care in the ED.    64 year old alcoholic who presents with scalp laceration status post mechanical fall.  Differential diagnosis includes but is not limited to [place], SDH, cervical spine fracture, etc.  Will obtain basic lab work, CT head and cervical spine.  Initiate IV fluid hydration, update tetanus and reassess.   Clinical Course as of Oct 02 123  Fri Oct 02, 2019  0035 Updated patient of lab results and CT scans.  She is resting in no acute distress.  Nursing to cleanse head wound.   [JS]  0105 Spouse at bedside.  Patient tolerated staple repair well.  Will ambulate to make sure she is steady on her feet and her husband will drive her home.  Strict return precautions given.  Both verbalized understanding agree with plan of care.   [JS]    Clinical Course User Index [JS] Paulette Blanch, MD     ____________________________________________   FINAL CLINICAL IMPRESSION(S) / ED DIAGNOSES  Final diagnoses:  Alcoholic intoxication without complication ([place])  Injury of head, initial encounter  Scalp laceration, initial encounter     ED Discharge Orders    None       Note:  This document was prepared using Dragon voice recognition software and may include unintentional dictation errors.   Paulette Blanch, MD 10/02/19 [phone]

## 2019-10-01 NOTE — ED Triage Notes (Signed)
Patient had fall at home in kitchen striking head on stove. Patient has drunk approximately 1 bottle of wine tonight. Patient with significant bleeding on head, laceration and "flap" of skin posterior head per EMS. Patient talking constantly, but only mildly compliant.

## 2019-10-02 ENCOUNTER — Emergency Department: Payer: BC Managed Care – PPO

## 2019-10-02 LAB — COMPREHENSIVE METABOLIC PANEL
ALT: 17 U/L (ref 0–44)
AST: 19 U/L (ref 15–41)
Albumin: 3.6 g/dL (ref 3.5–5.0)
Alkaline Phosphatase: 72 U/L (ref 38–126)
Anion gap: 11 (ref 5–15)
BUN: 21 mg/dL (ref 8–23)
CO2: 21 mmol/L — ABNORMAL LOW (ref 22–32)
Calcium: 8.8 mg/dL — ABNORMAL LOW (ref 8.9–10.3)
Chloride: 104 mmol/L (ref 98–111)
Creatinine, Ser: 0.79 mg/dL (ref 0.44–1.00)
GFR calc Af Amer: >60 mL/min (ref >60)
GFR calc non Af Amer: >60 mL/min (ref >60)
Glucose, Bld: 186 mg/dL — ABNORMAL HIGH (ref 70–99)
Potassium: 3.5 mmol/L (ref 3.5–5.1)
Sodium: 136 mmol/L (ref 135–145)
Total Bilirubin: 0.7 mg/dL (ref 0.3–1.2)
Total Protein: 6.6 g/dL (ref 6.5–8.1)

## 2019-10-02 LAB — ETHANOL: Alcohol, Ethyl (B): 253 mg/dL — ABNORMAL HIGH (ref <10)

## 2019-10-02 NOTE — Discharge Instructions (Addendum)
Staple removal in 7 to 10 days.  Drink alcohol only in moderation.  Your tetanus has been updated and will be good for 10 years.  Return to the ER for worsening symptoms, persistent vomiting, lethargy or other concerns.

## 2020-09-26 ENCOUNTER — Other Ambulatory Visit: Payer: Self-pay | Admitting: Surgery

## 2020-10-06 ENCOUNTER — Other Ambulatory Visit
Admission: RE | Admit: 2020-10-06 | Discharge: 2020-10-06 | Disposition: A | Discharge status: Home / Self Care | Payer: BC Managed Care – PPO | Source: Ambulatory Visit | Attending: Surgery | Admitting: Surgery

## 2020-10-06 ENCOUNTER — Other Ambulatory Visit: Payer: Self-pay

## 2020-10-06 HISTORY — DX: Gastro-esophageal reflux disease without esophagitis: K21.9

## 2020-10-06 HISTORY — DX: Other complications of anesthesia, initial encounter: T88.59XA

## 2020-10-06 NOTE — Patient Instructions (Signed)
Your procedure is scheduled on: Thursday 10/13/20.  Report to THE FIRST FLOOR REGISTRATION DESK IN THE MEDICAL MALL ON THE MORNING OF SURGERY FIRST, THEN YOU WILL CHECK IN AT THE SURGERY INFORMATION DESK LOCATED OUTSIDE THE SAME DAY SURGERY DEPARTMENT LOCATED ON 2ND FLOOR MEDICAL MALL ENTRANCE.   To find out your arrival time please call [phone] between 1PM - 3PM on Wednesday 10/12/20.   Remember: Instructions that are not followed completely may result in serious medical risk, up to and including death, or upon the discretion of your surgeon and anesthesiologist your surgery may need to be rescheduled.     __X__ 1. Do not eat food after midnight the night before your procedure.                 No gum chewing or hard candies. You may drink clear liquids up to 2 hours                 before you are scheduled to arrive for your surgery- DO NOT drink clear                 liquids within 2 hours of the start of your surgery.                 Clear Liquids include:  water, apple juice without pulp, clear carbohydrate                 drink such as Clearfast or Gatorade, Black Coffee or Tea (Do not add                 milk or creamer to coffee or tea).  **Dr. Roland Rack would like for you to finish the Clear Pre-Surgery Ensure on the morning of surgery 2 hours prior to your arrival time.  __X__2.  On the morning of surgery brush your teeth with toothpaste and water, you may rinse your mouth with mouthwash if you wish.  Do not swallow any toothpaste or mouthwash.    __X__ 3.  No Alcohol for 24 hours before or after surgery.  __X__ 4.  Do Not Smoke or use e-cigarettes For 24 Hours Prior to Your Surgery.                 Do not use any chewable tobacco products for at least 6 hours prior to                 surgery.  __X__5.  Notify your doctor if there is any change in your medical condition      (cold, fever, infections).      Do NOT wear jewelry, make-up, hairpins, clips or nail polish. Do NOT  wear lotions, powders, or perfumes.  Do NOT shave 48 hours prior to surgery. Men may shave face and neck. Do NOT bring valuables to the hospital.     [facility] is not responsible for any belongings or valuables.   Contacts, dentures/partials or body piercings may not be worn into surgery. Bring a case for your contacts, glasses or hearing aids, a denture cup will be supplied.   Patients discharged the day of surgery will not be allowed to drive home.     __X__ Take these medicines the morning of surgery with A SIP OF WATER:     1. gabapentin (NEURONTIN)  2. omeprazole (PRILOSEC)   3. propranolol (INDERAL)   4. sertraline (ZOLOFT)     __X__ Use CHG Soap as directed.  __X__ Stop  Anti-inflammatories 7 days before surgery such as Advil, Ibuprofen, Motrin, BC or Goodies Powder, Naprosyn, Naproxen, Aleve, Aspirin, Meloxicam. May take Tylenol if needed for pain or discomfort.   __X__Do not start taking any new herbal supplements or vitamins prior to your procedure.     Wear comfortable clothing (specific to your surgery type) to the hospital.  Plan for stool softeners for home use; pain medications have a tendency to cause constipation. You can also help prevent constipation by eating foods high in fiber such as fruits and vegetables and drinking plenty of fluids as your diet allows.  After surgery, you can prevent lung complications by doing breathing exercises.Take deep breaths and cough every 1-2 hours. Your doctor may order a device called an Incentive Spirometer to help you take deep breaths.  Please call the [facility] Department at [phone] if you have any questions about these instructions.

## 2020-10-11 ENCOUNTER — Other Ambulatory Visit
Admission: RE | Admit: 2020-10-11 | Discharge: 2020-10-11 | Disposition: A | Discharge status: Home / Self Care | Payer: BC Managed Care – PPO | Source: Ambulatory Visit | Attending: Surgery | Admitting: Surgery

## 2020-10-11 ENCOUNTER — Other Ambulatory Visit: Payer: Self-pay

## 2020-10-11 DIAGNOSIS — Z9884 Bariatric surgery status: Secondary | ICD-10-CM | POA: Diagnosis not present

## 2020-10-11 DIAGNOSIS — Z90721 Acquired absence of ovaries, unilateral: Secondary | ICD-10-CM | POA: Diagnosis not present

## 2020-10-11 DIAGNOSIS — Z01812 Encounter for preprocedural laboratory examination: Secondary | ICD-10-CM | POA: Insufficient documentation

## 2020-10-11 DIAGNOSIS — Z8632 Personal history of gestational diabetes: Secondary | ICD-10-CM | POA: Diagnosis not present

## 2020-10-11 DIAGNOSIS — Z20822 Contact with and (suspected) exposure to covid-19: Secondary | ICD-10-CM | POA: Insufficient documentation

## 2020-10-11 DIAGNOSIS — M65331 Trigger finger, right middle finger: Secondary | ICD-10-CM | POA: Diagnosis not present

## 2020-10-11 DIAGNOSIS — M720 Palmar fascial fibromatosis [Dupuytren]: Secondary | ICD-10-CM | POA: Diagnosis present

## 2020-10-11 DIAGNOSIS — Z79899 Other long term (current) drug therapy: Secondary | ICD-10-CM | POA: Diagnosis not present

## 2020-10-12 LAB — SARS CORONAVIRUS 2 (TAT 6-24 HRS): SARS Coronavirus 2: NEGATIVE

## 2020-10-13 ENCOUNTER — Ambulatory Visit: Payer: BC Managed Care – PPO | Admitting: Anesthesiology

## 2020-10-13 ENCOUNTER — Other Ambulatory Visit: Payer: Self-pay

## 2020-10-13 ENCOUNTER — Encounter
Admission: RE | Disposition: A | Discharge status: Home / Self Care | Payer: Self-pay | Source: Home / Self Care | Attending: Surgery

## 2020-10-13 ENCOUNTER — Ambulatory Visit
Admission: RE | Admit: 2020-10-13 | Discharge: 2020-10-13 | Disposition: A | Discharge status: Home / Self Care | Payer: BC Managed Care – PPO | Attending: Surgery | Admitting: Surgery

## 2020-10-13 ENCOUNTER — Encounter: Payer: Self-pay | Admitting: Surgery

## 2020-10-13 DIAGNOSIS — M720 Palmar fascial fibromatosis [Dupuytren]: Secondary | ICD-10-CM | POA: Diagnosis not present

## 2020-10-13 DIAGNOSIS — Z20822 Contact with and (suspected) exposure to covid-19: Secondary | ICD-10-CM | POA: Insufficient documentation

## 2020-10-13 DIAGNOSIS — Z9884 Bariatric surgery status: Secondary | ICD-10-CM | POA: Insufficient documentation

## 2020-10-13 DIAGNOSIS — Z79899 Other long term (current) drug therapy: Secondary | ICD-10-CM | POA: Insufficient documentation

## 2020-10-13 DIAGNOSIS — Z90721 Acquired absence of ovaries, unilateral: Secondary | ICD-10-CM | POA: Insufficient documentation

## 2020-10-13 DIAGNOSIS — M65331 Trigger finger, right middle finger: Secondary | ICD-10-CM | POA: Insufficient documentation

## 2020-10-13 DIAGNOSIS — Z8632 Personal history of gestational diabetes: Secondary | ICD-10-CM | POA: Insufficient documentation

## 2020-10-13 HISTORY — PX: TRIGGER FINGER RELEASE: SHX641

## 2020-10-13 SURGERY — RELEASE, A1 PULLEY, FOR TRIGGER FINGER
Anesthesia: General | Site: Middle Finger | Laterality: Right

## 2020-10-13 MED ORDER — EPHEDRINE 5 MG/ML INJ
INTRAVENOUS | Status: AC
  Filled 2020-10-13: qty 10

## 2020-10-13 MED ORDER — PROMETHAZINE HCL 25 MG/ML IJ SOLN: 6.2500 mg | INTRAMUSCULAR | Status: DC | PRN

## 2020-10-13 MED ORDER — LACTATED RINGERS IV SOLN: INTRAVENOUS | Status: DC

## 2020-10-13 MED ORDER — ONDANSETRON HCL 4 MG/2ML IJ SOLN
INTRAMUSCULAR | Status: DC | PRN
  Administered 2020-10-13: 4 mg via INTRAVENOUS

## 2020-10-13 MED ORDER — HYDROCODONE-ACETAMINOPHEN 7.5-325 MG PO TABS
ORAL_TABLET | ORAL | Status: AC
  Administered 2020-10-13: 1 via ORAL
  Filled 2020-10-13: qty 1

## 2020-10-13 MED ORDER — EPHEDRINE SULFATE 50 MG/ML IJ SOLN
INTRAMUSCULAR | Status: DC | PRN
  Administered 2020-10-13 (×2): 5 mg via INTRAVENOUS

## 2020-10-13 MED ORDER — HYDROCODONE-ACETAMINOPHEN 5-325 MG PO TABS
ORAL_TABLET | ORAL | Status: AC
  Administered 2020-10-13: 1
  Filled 2020-10-13: qty 1

## 2020-10-13 MED ORDER — FENTANYL CITRATE (PF) 100 MCG/2ML IJ SOLN
INTRAMUSCULAR | Status: AC
  Filled 2020-10-13: qty 2

## 2020-10-13 MED ORDER — ACETAMINOPHEN 325 MG PO TABS
325.0000 mg | ORAL_TABLET | ORAL | Status: DC | PRN
Start: 2020-10-13 — End: 2020-10-13

## 2020-10-13 MED ORDER — FENTANYL CITRATE (PF) 100 MCG/2ML IJ SOLN
INTRAMUSCULAR | Status: DC | PRN
  Administered 2020-10-13: 25 ug via INTRAVENOUS
  Administered 2020-10-13: 50 ug via INTRAVENOUS

## 2020-10-13 MED ORDER — KETOROLAC TROMETHAMINE 30 MG/ML IJ SOLN: 30.0000 mg | Freq: Once | INTRAMUSCULAR | Status: DC | PRN

## 2020-10-13 MED ORDER — DROPERIDOL 2.5 MG/ML IJ SOLN
0.6250 mg | Freq: Once | INTRAMUSCULAR | Status: DC | PRN
  Filled 2020-10-13: qty 2

## 2020-10-13 MED ORDER — MEPERIDINE HCL 50 MG/ML IJ SOLN: 6.2500 mg | INTRAMUSCULAR | Status: DC | PRN

## 2020-10-13 MED ORDER — HYDROCODONE-ACETAMINOPHEN 7.5-325 MG PO TABS: 1.0000 | ORAL_TABLET | Freq: Once | ORAL | Status: AC | PRN

## 2020-10-13 MED ORDER — ACETAMINOPHEN 160 MG/5ML PO SOLN
325.0000 mg | ORAL | Status: DC | PRN
  Filled 2020-10-13: qty 20.3

## 2020-10-13 MED ORDER — CEFAZOLIN SODIUM-DEXTROSE 2-4 GM/100ML-% IV SOLN
2.0000 g | INTRAVENOUS | Status: AC
  Administered 2020-10-13: 2 g via INTRAVENOUS

## 2020-10-13 MED ORDER — PROPOFOL 10 MG/ML IV BOLUS
INTRAVENOUS | Status: DC | PRN
  Administered 2020-10-13: 50 mg via INTRAVENOUS
  Administered 2020-10-13: 150 mg via INTRAVENOUS

## 2020-10-13 MED ORDER — FENTANYL CITRATE (PF) 100 MCG/2ML IJ SOLN
INTRAMUSCULAR | Status: AC
  Administered 2020-10-13: 50 ug via INTRAVENOUS
  Filled 2020-10-13: qty 2

## 2020-10-13 MED ORDER — FENTANYL CITRATE (PF) 100 MCG/2ML IJ SOLN
25.0000 ug | INTRAMUSCULAR | Status: DC | PRN
  Administered 2020-10-13 (×2): 25 ug via INTRAVENOUS

## 2020-10-13 MED ORDER — DEXAMETHASONE SODIUM PHOSPHATE 10 MG/ML IJ SOLN
INTRAMUSCULAR | Status: DC | PRN
  Administered 2020-10-13: 5 mg via INTRAVENOUS

## 2020-10-13 MED ORDER — MIDAZOLAM HCL 2 MG/2ML IJ SOLN
INTRAMUSCULAR | Status: AC
  Filled 2020-10-13: qty 2

## 2020-10-13 MED ORDER — LIDOCAINE HCL (CARDIAC) PF 100 MG/5ML IV SOSY
PREFILLED_SYRINGE | INTRAVENOUS | Status: DC | PRN
  Administered 2020-10-13: 60 mg via INTRAVENOUS

## 2020-10-13 MED ORDER — MIDAZOLAM HCL 2 MG/2ML IJ SOLN
INTRAMUSCULAR | Status: DC | PRN
  Administered 2020-10-13: 2 mg via INTRAVENOUS

## 2020-10-13 MED ORDER — CEFAZOLIN SODIUM-DEXTROSE 2-4 GM/100ML-% IV SOLN
INTRAVENOUS | Status: AC
  Filled 2020-10-13: qty 100

## 2020-10-13 MED ORDER — PROPOFOL 10 MG/ML IV BOLUS
INTRAVENOUS | Status: AC
  Filled 2020-10-13: qty 20

## 2020-10-13 MED ORDER — BUPIVACAINE HCL (PF) 0.5 % IJ SOLN
INTRAMUSCULAR | Status: DC | PRN
  Administered 2020-10-13: 10 mL

## 2020-10-13 MED ORDER — BUPIVACAINE HCL (PF) 0.5 % IJ SOLN
INTRAMUSCULAR | Status: AC
  Filled 2020-10-13: qty 30

## 2020-10-13 MED ORDER — TRAMADOL HCL 50 MG PO TABS: 50.0000 mg | ORAL_TABLET | Freq: Four times a day (QID) | ORAL | 0 refills | Status: DC | PRN

## 2020-10-13 SURGICAL SUPPLY — 26 items
APL PRP STRL LF DISP 70% ISPRP (MISCELLANEOUS) ×1
BNDG CMPR STD VLCR NS LF 5.8X2 (GAUZE/BANDAGES/DRESSINGS) ×1
BNDG ELASTIC 2X5.8 VLCR NS LF (GAUZE/BANDAGES/DRESSINGS) ×2 IMPLANT
BNDG ESMARK 4X12 TAN STRL LF (GAUZE/BANDAGES/DRESSINGS) ×2 IMPLANT
CHLORAPREP W/TINT 26 (MISCELLANEOUS) ×2 IMPLANT
CORD BIP STRL DISP 12FT (MISCELLANEOUS) ×2 IMPLANT
COVER WAND RF STERILE (DRAPES) ×2 IMPLANT
CUFF TOURN SGL QUICK 18X4 (TOURNIQUET CUFF) ×2 IMPLANT
DRAPE SURG 17X11 SM STRL (DRAPES) ×4 IMPLANT
FORCEPS JEWEL BIP 4-3/4 STR (INSTRUMENTS) ×2 IMPLANT
GAUZE SPONGE 4X4 12PLY STRL (GAUZE/BANDAGES/DRESSINGS) ×2 IMPLANT
GAUZE XEROFORM 1X8 LF (GAUZE/BANDAGES/DRESSINGS) ×2 IMPLANT
GLOVE SURG ENC MOIS LTX SZ8 (GLOVE) ×4 IMPLANT
GLOVE SURG UNDER LTX SZ8 (GLOVE) ×2 IMPLANT
GOWN STRL REUS W/ TWL LRG LVL3 (GOWN DISPOSABLE) ×1 IMPLANT
GOWN STRL REUS W/ TWL XL LVL3 (GOWN DISPOSABLE) ×1 IMPLANT
GOWN STRL REUS W/TWL LRG LVL3 (GOWN DISPOSABLE) ×2
GOWN STRL REUS W/TWL XL LVL3 (GOWN DISPOSABLE) ×2
KIT TURNOVER KIT A (KITS) ×2 IMPLANT
MANIFOLD NEPTUNE II (INSTRUMENTS) ×2 IMPLANT
NEEDLE HYPO 25X1 1.5 SAFETY (NEEDLE) ×2 IMPLANT
NS IRRIG 500ML POUR BTL (IV SOLUTION) ×2 IMPLANT
PACK EXTREMITY ARMC (MISCELLANEOUS) ×2 IMPLANT
SLING ARM S TX990203 (SOFTGOODS) ×2 IMPLANT
STOCKINETTE IMPERVIOUS 9X36 MD (GAUZE/BANDAGES/DRESSINGS) ×2 IMPLANT
SUT PROLENE 4 0 PS 2 18 (SUTURE) ×2 IMPLANT

## 2020-10-13 NOTE — Op Note (Signed)
10/13/2020  2:07 PM  Patient:   Gabrielle Malone  Pre-Op Diagnosis:   1.  Dupuytren's contracture right palm. 2.  Right long trigger finger.  Post-Op Diagnosis:   Same  Procedure:   1.  Excision of Dupuytren's contracture right hand. 2.  Release right long trigger finger.  Surgeon:   Pascal Lux, MD  Assistant:   Carlena Hurl, PA-S  Anesthesia:   General LMA  Findings:   As above.  Complications:   None  EBL:   1 cc  Fluids:   800 cc crystalloid  TT:   32 minutes at 250 mmHg  Drains:   None  Closure:   4-0 Prolene  Implants:   None  Brief Clinical Note:   The patient is a 65 year old female with a history of painful catching of her right long finger.  Her symptoms have progressed despite medications, activity modification, etc.  Her history and examination are consistent with a right long trigger finger. The patient presents at this time for a right long trigger finger release.  The patient also notes a history of a slowly enlarging mass on the palmar aspect of her hand over the right long finger metacarpal.  The mass has the appearance of Dupuytren's contracture.  She notes that this is becoming increasingly painful with grasping and holding objects.  She requests that this be removed as well.  Procedure:   The patient was brought into the operating room and lain in the supine position. After adequate general laryngeal mask anesthesia was achieved, the right hand and upper extremity were prepped with ChloraPrep solution before being draped sterilely. Preoperative antibiotics were administered.  A timeout was performed to verify the appropriate surgical site before the limb was exsanguinated with an Esmarch and the tourniquet inflated to 250 mmHg.    An approximately 3 cm zigzag incision was made over the volar aspect of the right long finger beginning distally at the level of the metacarpal head and extending proximally to provide exposure of the Dupuytren's contracture  nodule. The incision was carried down through the subcutaneous tissues with care taken to identify and protect the digital neurovascular structures. The area of subcutaneous thickening was debrided circumferentially using iris scissors and the area of Dupuytren's contracture tissue was excised. Hemostasis was achieved using bipolar electrocautery.  Attention then was addressed to the trigger finger portion of the procedure. The flexor sheath was entered just proximal to the A1 pulley. The sheath was released proximally for several centimeters under direct visualization. Distally, a clamp was placed beneath the A1 pulley and used to release any adhesions. The clamp was repositioned so that one jaw was superficial to and the other jaw deep to the A1 pulley. The A1 pulley was incised on either side of the clamp to remove a 2 mm strip of tissue. Metzenbaum scissors were used to ensure complete release of the A1 pulley more distally. The underlying tendons were carefully inspected and found to be intact. Moderate tenosynovial thickening was debrided from around the tendons.  The wound was copiously irrigated with sterile saline solution before the wound was closed using 4-0 Prolene interrupted sutures. A total of 10 cc of 0.5% plain Sensorcaine was injected in and around the incision to help with postoperative analgesia before a sterile bulky dressing was applied to the hand. The patient was then awakened, extubated, and returned to the recovery room in satisfactory condition after tolerating the procedure well.

## 2020-10-13 NOTE — Anesthesia Procedure Notes (Signed)
Procedure Name: LMA Insertion Performed by: Fredderick Phenix, CRNA Pre-anesthesia Checklist: Patient identified, Emergency Drugs available, Suction available and Patient being monitored Patient Re-evaluated:Patient Re-evaluated prior to induction Oxygen Delivery Method: Circle system utilized Preoxygenation: Pre-oxygenation with 100% oxygen Induction Type: IV induction Ventilation: Mask ventilation without difficulty LMA: LMA inserted LMA Size: 3.0 Tube type: Oral Number of attempts: 1 Airway Equipment and Method: Oral airway Placement Confirmation: positive ETCO2 and breath sounds checked- equal and bilateral Tube secured with: Tape Dental Injury: Teeth and Oropharynx as per pre-operative assessment

## 2020-10-13 NOTE — H&P (Signed)
History of Present Illness: Gabrielle Malone is a 65 y.o. female who presents today for evaluation of ongoing right hand pain. Over the past several months the patient has noticed a "knot' out any injury or trauma. The patient has also noticed that her right middle finger will catch especially when she is trying to flex her right middle finger. The patient is status post a right carpal tunnel release procedure performed on the right hand, she reports occasional tingling the right hand at this time. She denies any surgical history to her fingers. The patient is right-hand dominant. She is taking Tylenol and occasional naproxen which she states did provide more relief than Tylenol. The patient denies any family history of Dupuytren's contracture. She has noticed that when applying heat to the right hand this will help with the pain that she is experiencing. Pain score today is a 4 out of 10. The patient denies any personal history of heart attack, stroke, asthma or COPD. No personal history of blood clots.  Past Medical History: . Anxiety  . Depression  . Endometriosis  . Gestational diabetes  . Menstrual problem, unspecified  . Status post normal childbirth 1991 (vaginal)   Past Surgical History: . CESAREAN SECTION x1  . COLONOSCOPY 11/10/2013 (Dr. Mamie Nick. [place] @ [place] - Hyperplastic Polyp, PHPolyp: 5 yr rpt per [facility])  . COLONOSCOPY 04/25/2009 (Dr. Mamie Nick. [place] @ [place])  . ENDOSCOPIC CARPAL TUNNEL RELEASE Bilateral  . EXPLORATORY LAPAROTOMY (Endometriosis)  . LAPAROSCOPIC GASTRIC BYPASS  . LAPAROSCOPIC TUBAL LIGATION  . OOPHORECTOMY (For Dermoid)  . PHOTOREFRACTIVE KERATOTOMY/LASIK  . RHINOPLASTY 1977  . RHINOPLASTY (age 32 years)  . TONSILLECTOMY   Past Family History: . Dementia Mother  . Lung Cancer Father 72  . Melanoma Father  . Dementia Maternal Grandmother  . Obesity Daughter  . Obesity Daughter   Medications: . cetirizine (ZYRTEC) 10 mg capsule Take 1 capsule (10 mg total) by  mouth once daily 30 capsule 1  . cyanocobalamin (VITAMIN B12) 1,000 mcg/mL injection ADMINISTER 1 ML IN THE MUSCLE EVERY MONTH 3 mL 2  . dextroamphetamine-amphetamine (ADDERALL) 10 mg tablet Take 10 mg by mouth  . fluticasone propionate (FLONASE) 50 mcg/actuation nasal spray SHAKE LIQUID AND USE 2 SPRAYS IN EACH NOSTRIL EVERY DAY 48 g 0  . gabapentin (NEURONTIN) 300 MG capsule Take 300 mg by mouth nightly  . ibandronate (BONIVA) 150 mg tablet TAKE 1 TABLET(150 MG) BY MOUTH EVERY 30 DAYS WITH A FULL GLASS OF WATER. DO NOT LIE DOWN FOR THE NEXT 60 MINUTES 3 tablet 3  . multivitamin tablet Take 1 tablet by mouth once daily.  . naproxen (NAPROSYN) 500 MG tablet TAKE 1 TABLET(500 MG) BY MOUTH TWICE DAILY AS NEEDED (Patient not taking: Reported on 09/02/2020) 30 tablet 0  . omeprazole (PRILOSEC) 40 MG DR capsule TAKE 1 CAPSULE(40 MG) BY MOUTH EVERY DAY 90 capsule 3  . propranoloL (INDERAL) 20 MG tablet TAKE 1 TABLET(20 MG) BY MOUTH TWICE DAILY 60 tablet 11  . sertraline (ZOLOFT) 100 MG tablet Take 100 mg by mouth every morning  . valACYclovir (VALTREX) 1000 MG tablet Take 1 tablet (1,000 mg total) by mouth 2 (two) times daily at first onset of lesion. Take until complete resolution. 30 tablet 1  . zolpidem (AMBIEN) 10 mg tablet Take 1 tablet (10 mg total) by mouth nightly 30 tablet 3   Allergies: No Known Allergies   Review of Systems:  A comprehensive 14 point ROS was performed, reviewed by  me today, and the pertinent orthopaedic findings are documented in the HPI.  Physical Exam: BP 124/86 (BP Location: Left upper arm, Patient Position: Sitting, BP Cuff Size: Adult)  Ht 154.9 cm (5\' 1" )  Wt 56 kg (123 lb 6.4 oz)  BMI 23.32 kg/m  General/Constitutional: The patient appears to be well-nourished, well-developed, and in no acute distress. Neuro/Psych: Normal mood and affect, oriented to person, place and time. Eyes: Non-icteric. Pupils are equal, round, and reactive to light, and exhibit  synchronous movement. ENT: Unremarkable. Lymphatic: No palpable adenopathy. Respiratory: Lungs clear to auscultation, Normal chest excursion, No wheezes and Non-labored breathing Cardiovascular: Regular rate and rhythm. No murmurs. and No edema, swelling or tenderness, except as noted in detailed exam. Integumentary: No impressive skin lesions present, except as noted in detailed exam. Musculoskeletal: Unremarkable, except as noted in detailed exam.  Skin examination of the right hand demonstrates no open wound, erythema or ecchymosis. Skin examination of the right palm does reveal what appears to be a early Dupuytren's fibrosis beginning to form in the palm of the hand affecting the flexor tendon of the middle finger. The patient is able to completely extend all digits of the right hand, increased discomfort with flexion of the right middle finger. She is tender to palpation of the base of the right middle finger as well a painful nodule can be palpated in this area. There is noticeable catching of the middle finger at today's visit. No angulation or rotation of the digits is noted. The patient is intact light touch of the right upper extremity. Cap refills intact to each individual digit. Radial pulses intact to the right hand.  Imaging: X-rays of the right hand were obtained by the patient's primary care provider on 07/06/2020. These x-rays demonstrate no significant degenerative changes. No acute abnormality or acute fracture is visualized.  Impression: 1. Trigger middle finger of right hand. 2. Dupuytren's contracture.  Plan:  1. Treatment options were discussed today with the patient. 2. I believe that the patient is having pain from 2 distinct sources. I believe that the nodule in her hand is likely early stages of Dupuytren's contracture also believe that the patient is having acute trigger finger as well. 3. Discussed conservative versus aggressive treatment options. The patient would  like to proceed with surgical release of the right middle finger trigger finger in addition to debridement of early Dupuytren's contracture. 4. Discussed the risk and benefits of surgery with the patient at today's visit. This document will serve as a surgical history and physical for the patient. 5. The patient will follow-up per standard post-op protocol. They can call the clinic they have any questions, new symptoms develop or symptoms worsen.  The procedure was discussed with the patient, as were the potential risks (including bleeding, infection, nerve and/or blood vessel injury, persistent or recurrent pain, failure of the repair, progression of arthritis, need for further surgery, blood clots, strokes, heart attacks and/or arhythmias, pneumonia, etc.) and benefits. The patient states her understanding and wishes to proceed.   H&P reviewed and patient re-examined. No changes.

## 2020-10-13 NOTE — Anesthesia Preprocedure Evaluation (Signed)
Anesthesia Evaluation  Patient identified by MRN, date of birth, ID band Patient awake    Reviewed: Allergy & Precautions, H&P , NPO status , reviewed documented beta blocker date and time   History of Anesthesia Complications (+) AWARENESS UNDER ANESTHESIA and history of anesthetic complications  Airway Mallampati: II  TM Distance: >3 FB Neck ROM: full    Dental  (+) Teeth Intact, Chipped   Pulmonary    Pulmonary exam normal        Cardiovascular Exercise Tolerance: Good Normal cardiovascular exam     Neuro/Psych PSYCHIATRIC DISORDERS Anxiety Depression    GI/Hepatic GERD  Controlled,  Endo/Other  diabetes  Renal/GU      Musculoskeletal   Abdominal   Peds  Hematology   Anesthesia Other Findings Past Medical History: No date: Anxiety No date: Complication of anesthesia     Comment:  Remembers part of surgery from rhinoplasty No date: Depression No date: Endometriosis No date: GERD (gastroesophageal reflux disease) No date: Insomnia Past Surgical History: No date: carpal tunnel No date: CESAREAN SECTION No date: GASTRIC BYPASS No date: OOPHORECTOMY No date: RHINOPLASTY   Reproductive/Obstetrics                             Anesthesia Physical Anesthesia Plan  ASA: II  Anesthesia Plan: General LMA   Post-op Pain Management:    Induction: Intravenous  PONV Risk Score and Plan: 3 and Ondansetron, Midazolam, Treatment may vary due to age or medical condition and Dexamethasone  Airway Management Planned: LMA  Additional Equipment:   Intra-op Plan:   Post-operative Plan: Extubation in OR  Informed Consent: I have reviewed the patients History and Physical, chart, labs and discussed the procedure including the risks, benefits and alternatives for the proposed anesthesia with the patient or authorized representative who has indicated his/her understanding and acceptance.      Dental Advisory Given  Plan Discussed with: CRNA  Anesthesia Plan Comments:         Anesthesia Quick Evaluation

## 2020-10-13 NOTE — Discharge Instructions (Addendum)
Orthopedic discharge instructions: Keep dressing dry and intact. Keep hand elevated above heart level. May shower after dressing removed on postop day 4 (Monday). Cover sutures with Band-Aids after drying off. Apply ice to affected area frequently. Take ibuprofen 600 mg TID with meals for 7-10 days, then as necessary. Take ES Tylenol or pain medication as prescribed when needed.  Return for follow-up in 10-14 days or as scheduled.  AMBULATORY SURGERY  DISCHARGE INSTRUCTIONS   1) The drugs that you were given will stay in your system until tomorrow so for the next 24 hours you should not:  A) Drive an automobile B) Make any legal decisions C) Drink any alcoholic beverage   2) You may resume regular meals tomorrow.  Today it is better to start with liquids and gradually work up to solid foods.  You may eat anything you prefer, but it is better to start with liquids, then soup and crackers, and gradually work up to solid foods.   3) Please notify your doctor immediately if you have any unusual bleeding, trouble breathing, redness and pain at the surgery site, drainage, fever, or pain not relieved by medication.    4) Additional Instructions:        Please contact your physician with any problems or Same Day Surgery at [phone], Monday through Friday 6 am to 4 pm, or [place] at [facility] number at [phone].

## 2020-10-13 NOTE — Transfer of Care (Signed)
Immediate Anesthesia Transfer of Care Note  Patient: Gabrielle Malone  Procedure(s) Performed: RIGHT MIDDLE FINGER 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 (Right Middle Finger)  Patient Location: PACU  Anesthesia Type:General  Level of Consciousness: awake  Airway & Oxygen Therapy: Patient Spontanous Breathing and Patient connected to face mask oxygen  Post-op Assessment: Report given to RN and Post -op Vital signs reviewed and stable  Post vital signs: Reviewed and stable  Last Vitals:  Vitals Value Taken Time  BP 97/85 10/13/20 1410  Temp    Pulse 73 10/13/20 1413  Resp 12 10/13/20 1413  SpO2 99 % 10/13/20 1413  Vitals shown include unvalidated device data.  Last Pain:  Vitals:   10/13/20 1236  TempSrc: Temporal  PainSc: 0-No pain         Complications: No complications documented.

## 2020-10-14 ENCOUNTER — Encounter: Payer: Self-pay | Admitting: Surgery

## 2020-10-17 LAB — SURGICAL PATHOLOGY

## 2020-10-20 NOTE — Anesthesia Postprocedure Evaluation (Signed)
Anesthesia Post Note  Patient: Gabrielle Malone  Procedure(s) Performed: RIGHT MIDDLE FINGER 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 (Right Middle Finger)  Patient location during evaluation: PACU Anesthesia Type: General Level of consciousness: awake and alert Pain management: pain level controlled Vital Signs Assessment: post-procedure vital signs reviewed and stable Respiratory status: spontaneous breathing, nonlabored ventilation and respiratory function stable Cardiovascular status: blood pressure returned to baseline and stable Postop Assessment: no apparent nausea or vomiting Anesthetic complications: no   No complications documented.   Last Vitals:  Vitals:   10/13/20 1500 10/13/20 1530  BP: 133/88 131/80  Pulse: 60 66  Resp: 14 14  Temp: 37 C 36.8 C  SpO2: 98% 100%    Last Pain:  Vitals:   10/14/20 0902  TempSrc:   PainSc: 6                  Lyrik Buresh Harvie Heck

## 2020-12-16 ENCOUNTER — Ambulatory Visit: Payer: BC Managed Care – PPO | Attending: Surgery | Admitting: Occupational Therapy

## 2020-12-16 ENCOUNTER — Other Ambulatory Visit: Payer: Self-pay

## 2020-12-16 ENCOUNTER — Encounter: Payer: Self-pay | Admitting: Occupational Therapy

## 2020-12-16 DIAGNOSIS — L905 Scar conditions and fibrosis of skin: Secondary | ICD-10-CM | POA: Diagnosis present

## 2020-12-16 DIAGNOSIS — M25641 Stiffness of right hand, not elsewhere classified: Secondary | ICD-10-CM | POA: Insufficient documentation

## 2020-12-16 DIAGNOSIS — M6281 Muscle weakness (generalized): Secondary | ICD-10-CM | POA: Diagnosis present

## 2020-12-16 DIAGNOSIS — M79641 Pain in right hand: Secondary | ICD-10-CM | POA: Diagnosis present

## 2020-12-16 NOTE — Therapy (Signed)
[facility] [address], [place], [address] Phone: [phone]   Fax:  [phone]  Occupational Therapy Evaluation  Patient Details  Name: Gabrielle Malone MRN: [id number] Date of Birth: 1955-09-26 Referring Provider (OT): DR Roland Rack   Encounter Date: 12/16/2020   OT End of Session - 12/16/20 1237     Visit Number 1    Number of Visits 6    Date for OT Re-Evaluation 01/27/21    OT Start Time 0845    OT Stop Time 0928    OT Time Calculation (min) 43 min    Activity Tolerance Patient tolerated treatment well    Behavior During Therapy [facility] for tasks assessed/performed             Past Medical History:  Diagnosis Date   Anxiety    Complication of anesthesia    Remembers part of surgery from rhinoplasty   Depression    Endometriosis    GERD (gastroesophageal reflux disease)    Insomnia     Past Surgical History:  Procedure Laterality Date   carpal tunnel     CESAREAN SECTION     GASTRIC BYPASS     OOPHORECTOMY     RHINOPLASTY     TRIGGER FINGER RELEASE Right 10/13/2020   Procedure: RIGHT MIDDLE FINGER 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;  Surgeon: Corky Mull, MD;  Location: [facility] ORS;  Service: Orthopedics;  Laterality: Right;    There were no vitals filed for this visit.   Subjective Assessment - 12/16/20 1232     Subjective  My hand are just stiff and tender in the palm - cannot open my hand all the way or make fist    Pertinent History Gabrielle Malone is a 65 y.o. female who had on 10/13/20 -  Dupuytren's contracture with release of a right long trigger finger by Dr Roland Rack.  Overall, the patient feels that she is doing reasonably well. She still notes some stiffness/soreness in the long finger and hand. The soreness is worse in the afternoons. She has been taking naproxen as necessary with temporary partial relief of her symptoms as well as applying heat. She still  also notes some "tingling" along the middle finger. She has been performing exercises on her own at home. Refer o OT Halford Decamp threrapy    Patient Stated Goals To be able to get full motion in my hand and middle finger so I can use it normally and pain/tenderness better    Currently in Pain? 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[employer], like walk, kayak, travel      Edema   Edema 3rd digit PIP increase  by 0.7 cm      Strength   Right Hand Grip (lbs) 34    Right Hand Lateral Pinch 8 lbs    Right Hand 3 Point Pinch 6 lbs    Left Hand Grip (lbs) 46    Left Hand Lateral Pinch 10 lbs    Left Hand 3 Point Pinch 8 lbs      Right Hand AROM   R Thumb Opposition to Index --   Opposition pain with 3rd digit   R Index  MCP 0-90 90 Degrees    R Index PIP 0-100 100 Degrees    R Long  MCP 0-90 90 Degrees   -25   R Long PIP 0-100 70 Degrees    R Ring  MCP 0-90 90 Degrees    R Ring PIP 0-100 100 Degrees    R Little  MCP 0-90 90 Degrees    R Little PIP 0-100 100 Degrees                      OT Treatments/Exercises (OP) - 12/16/20 0001       RUE Contrast Bath   Time 8 minutes    Comments prior to soft tissue and ROM            Review with pt HEP  Scar massage and cica scar pad for night time and isotoner glove for decrease edema in 3rd digit - night time  Rolling over red foam roller for massage and extention of digist 20 reps pain  free Blocked tendon glides- composite fist to foam roller pain free -  Opposition pick up 1 cm foam block pain free  2-3  x day  Enlarge her grip at the moment when gripping objects - hold of on squeezing object        OT Education - 12/16/20 1237     Education Details findings of eval and HEP    Person(s) Educated Patient    Methods Explanation;Demonstration;Tactile cues;Verbal cues;Handout    Comprehension Verbal cues required;Returned demonstration;Verbalized understanding              OT Short Term Goals - 12/16/20 1240       OT SHORT TERM GOAL #1   Title Pt to be independeint in HEP to decrease scar tissue, edema and pain to touch palm with 3rd digit    Baseline pain 3/10 and scar tissue thick and tender - Flexion of PIP 70 , ext -25 at [facility] at 3rd digit    Time 3    Period Weeks    Status New    Target Date 01/06/21               OT Long Term Goals - 12/16/20 1241       OT LONG TERM GOAL #1   Title Pt to make composite fist with R hand touching palm without increase symptoms    Baseline pain and scar tissue 3/10 - and tender - flexion of 3rdPIP 70 - edema 0.7 at PIP    Time 4    Period Weeks    Status New    Target Date 01/13/21      OT LONG TERM GOAL #2   Title R grip and prehension strength increase to WNL compare to L to carry groceries, kayak and use tools without symptoms    Baseline GRip R 34, L 46 ; lat and 3 point decrease by 2 lbs compare to L - pt  is R hand dominant - tender and thick scar tissue - symptoms increase 3/10    Time 6    Period Weeks    Status New    Target Date 01/27/21      OT LONG TERM GOAL #3   Title 3rd digit extention increase to WNL to put hand in pocket and no issue typing on computer    Baseline scar tissue tender and pain in palm 3/10 - MC extention of 3rd -25    Time 5    Period Weeks    Status New    Target Date 01/20/21                   Plan - 12/16/20 1238     Clinical Impression Statement Pt  present at OT eval about 9 wks s/p R 3rd digit trigger finger and dupuytrens release- pt show increase scar tissue and adhesionsin palm , decrease ext of MC and decrease PIP flexion and increase edema -and pain in palm - decrease grip and prehension limiting her functional use of R dominant hand - pt can benefit from OT services    OT Occupational Profile and History Problem Focused Assessment - Including review of records relating to presenting problem    Occupational performance deficits (Please refer to evaluation for details): ADL's;IADL's;Work;Play;Leisure;Social Participation    Body Structure / Function / Physical Skills ADL;Strength;Pain;Edema;UE functional use;ROM;Scar mobility;Flexibility;FMC;Decreased knowledge of precautions    Rehab Potential Good    Clinical Decision Making Limited treatment options, no task modification necessary    Comorbidities Affecting Occupational Performance: None    Modification or Assistance to Complete Evaluation  No modification of tasks or assist necessary to complete eval    OT Frequency 1x / week    OT Duration 6 weeks    OT Treatment/Interventions Self-care/ADL training;Moist Heat;Splinting;Manual Therapy;Paraffin;Passive range of motion;Scar mobilization;Therapeutic exercise;Contrast Bath    Consulted and Agree with Plan of Care Patient             Patient will benefit from skilled therapeutic intervention in order to improve the following deficits and impairments:   Body Structure / Function / Physical Skills: ADL, Strength, Pain, Edema, UE functional use, ROM, Scar mobility, Flexibility, FMC, Decreased knowledge of precautions       Visit Diagnosis: Scar condition and fibrosis of skin - Plan: Ot plan of care cert/re-cert  Pain in right hand - Plan: Ot plan of care cert/re-cert  Stiffness of right hand, not elsewhere classified - Plan: Ot plan of care cert/re-cert  Muscle weakness (generalized) - Plan: Ot plan of care  cert/re-cert    Problem List Patient Active Problem List   Diagnosis Date Noted   Anxiety 04/23/2017   Depression 04/23/2017   Gestational diabetes 04/23/2017   Endometriosis 04/23/2017   Menstrual problem 04/23/2017   Status post normal childbirth 04/23/2017   Physical exam, organ donor 02/05/2013   Pre-op evaluation 02/05/2013   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    Rosalyn Gess OTR/l,CLT 12/16/2020, 12:46 PM  [facility] [address] [place], [address] Phone: [phone]   Fax:  [phone]  Name: Aino Heckert MRN: [id number] Date of Birth: 10-25-1955

## 2020-12-20 ENCOUNTER — Ambulatory Visit: Payer: BC Managed Care – PPO | Admitting: Occupational Therapy

## 2020-12-20 ENCOUNTER — Other Ambulatory Visit: Payer: Self-pay

## 2020-12-20 DIAGNOSIS — M25641 Stiffness of right hand, not elsewhere classified: Secondary | ICD-10-CM

## 2020-12-20 DIAGNOSIS — M6281 Muscle weakness (generalized): Secondary | ICD-10-CM

## 2020-12-20 DIAGNOSIS — L905 Scar conditions and fibrosis of skin: Secondary | ICD-10-CM | POA: Diagnosis not present

## 2020-12-20 DIAGNOSIS — M79641 Pain in right hand: Secondary | ICD-10-CM

## 2020-12-20 NOTE — Therapy (Signed)
[facility] [address] [place], [address] Phone: [phone]   Fax:  [phone]  Occupational Therapy Treatment  Patient Details  Name: Gabrielle Malone MRN: [id number] Date of Birth: Oct 28, 1955 Referring Provider (OT): DR Poggi   Encounter Date: 12/20/2020   OT End of Session - 12/20/20 1511     Visit Number 2    Number of Visits 6    Date for OT Re-Evaluation 01/27/21    OT Start Time 1455    OT Stop Time 1530    OT Time Calculation (min) 35 min    Activity Tolerance Patient tolerated treatment well    Behavior During Therapy [facility] for tasks assessed/performed             Past Medical History:  Diagnosis Date   Anxiety    Complication of anesthesia    Remembers part of surgery from rhinoplasty   Depression    Endometriosis    GERD (gastroesophageal reflux disease)    Insomnia     Past Surgical History:  Procedure Laterality Date   carpal tunnel     CESAREAN SECTION     GASTRIC BYPASS     OOPHORECTOMY     RHINOPLASTY     TRIGGER FINGER RELEASE Right 10/13/2020   Procedure: RIGHT MIDDLE FINGER 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;  Surgeon: Corky Mull, MD;  Location: [facility] ORS;  Service: Orthopedics;  Laterality: Right;    There were no vitals filed for this visit.   Subjective Assessment - 12/20/20 1456     Subjective  My fingers feels like it wanted to lock when making fist and scar feels thicker    Pertinent History Gabrielle Malone is a 65 y.o. female who had on 10/13/20 -  Dupuytren's contracture with release of a right long trigger finger by Dr Roland Rack.  Overall, the patient feels that she is doing reasonably well. She still notes some stiffness/soreness in the long finger and hand. The soreness is worse in the afternoons. She has been taking naproxen as necessary with temporary partial relief of her symptoms as well as applying heat. She still also notes some  "tingling" along the middle finger. She has been performing exercises on her own at home. Refer o OT Halford Decamp threrapy    Patient Stated Goals To be able to get full motion in my hand and middle finger so I can use it normally and pain/tenderness better    Currently in Pain? Yes    Pain Score 4     Pain Location Hand    Pain Orientation Right    Pain Descriptors / Indicators Aching;Tightness    Pain Type Surgical pain    Pain Onset More than a month ago               3rd digit extention -30 this date -was last week -25  Flexion of PIP still decrease and composite flexion - decrease with pain in palm - over scar - 4/10 with composite fist - not touching palm - but to foam roller            OT Treatments/Exercises (OP) - 12/20/20 0001       RUE Paraffin   Number Minutes Paraffin 8 Minutes    RUE Paraffin Location Hand    Comments prior to soft itsue nad ROM           Able to get full extnetion after paraffin  and stretch Gabrielle Malone    Review with pt HEP again and change   Scar massage done using mini massager and manual by OT  With extention  stretch to 3rd digit  cica scar pad for night time and isotoner glove for decrease edema in 3rd digit - night time  Rolling over red foam roller for massage and extention of digist 20 reps pain free Blocked tendon glides- - but PROM to DIP /PIP flexion - prior to intrinsic fist and composite fist to foam roller pain free - Opposition pick up 1 cm foam block pain free  2-3  x day Enlarge her grip at the moment when gripping objects - hold of on squeezing object  ? Scar adhesions in palm- limiting flexion and extention           OT Education - 12/20/20 1511     Education Details HEP and findings of 2nd session    Person(s) Educated Patient    Methods Explanation;Demonstration;Tactile cues;Verbal cues;Handout    Comprehension Verbal cues required;Returned demonstration;Verbalized understanding              OT Short  Term Goals - 12/16/20 1240       OT SHORT TERM GOAL #1   Title Pt to be independeint in HEP to decrease scar tissue, edema and pain to touch palm with 3rd digit    Baseline pain 3/10 and scar tissue thick and tender - Flexion of PIP 70 , ext -25 at [facility] at 3rd digit    Time 3    Period Weeks    Status New    Target Date 01/06/21               OT Long Term Goals - 12/16/20 1241       OT LONG TERM GOAL #1   Title Pt to make composite fist with R hand touching palm without increase symptoms    Baseline pain and scar tissue 3/10 - and tender - flexion of 3rdPIP 70 - edema 0.7 at PIP    Time 4    Period Weeks    Status New    Target Date 01/13/21      OT LONG TERM GOAL #2   Title R grip and prehension strength increase to WNL compare to L to carry groceries, kayak and use tools without symptoms    Baseline GRip R 34, L 46 ; lat and 3 point decrease by 2 lbs compare to L - pt is R hand dominant - tender and thick scar tissue - symptoms increase 3/10    Time 6    Period Weeks    Status New    Target Date 01/27/21      OT LONG TERM GOAL #3   Title 3rd digit extention increase to WNL to put hand in pocket and no issue typing on computer    Baseline scar tissue tender and pain in palm 3/10 - MC extention of 3rd -25    Time 5    Period Weeks    Status New    Target Date 01/20/21                   Plan - 12/20/20 1512     Clinical Impression Statement Pt about 9 1/2  wks s/p R 3rd digit trigger finger and dupuytrens release- pt show increase scar tissue and adhesionsin palm , decrease ext of MC  this date compare to last week - and cont to show  decrease 3rd digit  PIP flexion and composite flexion - 4/10 pain in palm - cont to have increase edema -and pain in palm - decrease grip and prehension limiting her functional use of R dominant hand - pt can benefit from OT services - ? scar adhesion on tendon - pt did not start therapy until 8 wks s/p - 3 wks after referral to OT     OT Occupational Profile and History Problem Focused Assessment - Including review of records relating to presenting problem    Occupational performance deficits (Please refer to evaluation for details): ADL's;IADL's;Work;Play;Leisure;Social Participation    Body Structure / Function / Physical Skills ADL;Strength;Pain;Edema;UE functional use;ROM;Scar mobility;Flexibility;FMC;Decreased knowledge of precautions    Rehab Potential Good    Clinical Decision Making Limited treatment options, no task modification necessary    Comorbidities Affecting Occupational Performance: None    Modification or Assistance to Complete Evaluation  No modification of tasks or assist necessary to complete eval    OT Frequency 1x / week    OT Duration 6 weeks    OT Treatment/Interventions Self-care/ADL training;Moist Heat;Splinting;Manual Therapy;Paraffin;Passive range of motion;Scar mobilization;Therapeutic exercise;Contrast Bath    Consulted and Agree with Plan of Care Patient             Patient will benefit from skilled therapeutic intervention in order to improve the following deficits and impairments:   Body Structure / Function / Physical Skills: ADL, Strength, Pain, Edema, UE functional use, ROM, Scar mobility, Flexibility, FMC, Decreased knowledge of precautions       Visit Diagnosis: Scar condition and fibrosis of skin  Pain in right hand  Stiffness of right hand, not elsewhere classified  Muscle weakness (generalized)    Problem List Patient Active Problem List   Diagnosis Date Noted   Anxiety 04/23/2017   Depression 04/23/2017   Gestational diabetes 04/23/2017   Endometriosis 04/23/2017   Menstrual problem 04/23/2017   Status post normal childbirth 04/23/2017   Physical exam, organ donor 02/05/2013   Pre-op evaluation 02/05/2013   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    Rosalyn Gess OTR/L,CLT 12/20/2020, 4:27 PM  [facility] [address] [place], [address] Phone: [phone]   Fax:  [phone]  Name: Gabrielle Malone MRN: [id number] Date of Birth: 25-Sep-1955

## 2020-12-26 ENCOUNTER — Other Ambulatory Visit: Payer: Self-pay

## 2020-12-26 ENCOUNTER — Ambulatory Visit: Payer: BC Managed Care – PPO | Admitting: Occupational Therapy

## 2020-12-26 DIAGNOSIS — L905 Scar conditions and fibrosis of skin: Secondary | ICD-10-CM | POA: Diagnosis not present

## 2020-12-26 DIAGNOSIS — M6281 Muscle weakness (generalized): Secondary | ICD-10-CM

## 2020-12-26 DIAGNOSIS — M25641 Stiffness of right hand, not elsewhere classified: Secondary | ICD-10-CM

## 2020-12-26 DIAGNOSIS — M79641 Pain in right hand: Secondary | ICD-10-CM

## 2020-12-26 NOTE — Therapy (Signed)
[facility] [address], [place], [address] Phone: [phone]   Fax:  [phone]  Occupational Therapy Treatment  Patient Details  Name: Gabrielle Malone MRN: [id number] Date of Birth: [birth year] Referring Provider (OT): DR Roland Rack   Encounter Date: 12/26/2020   OT End of Session - 12/26/20 1817     Visit Number 3    Number of Visits 6    Date for OT Re-Evaluation 01/27/21    OT Start Time 1600    OT Stop Time 1646    OT Time Calculation (min) 46 min    Activity Tolerance Patient tolerated treatment well    Behavior During Therapy [facility] for tasks assessed/performed             Past Medical History:  Diagnosis Date   Anxiety    Complication of anesthesia    Remembers part of surgery from rhinoplasty   Depression    Endometriosis    GERD (gastroesophageal reflux disease)    Insomnia     Past Surgical History:  Procedure Laterality Date   carpal tunnel     CESAREAN SECTION     GASTRIC BYPASS     OOPHORECTOMY     RHINOPLASTY     TRIGGER FINGER RELEASE Right 10/13/2020   Procedure: RIGHT MIDDLE FINGER 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;  Surgeon: Corky Mull, MD;  Location: [facility] ORS;  Service: Orthopedics;  Laterality: Right;    There were no vitals filed for this visit.   Subjective Assessment - 12/26/20 1815     Subjective  I got me a paraffin bath but did not use it yet- the massage I need to see what you do - do you think I need that little massager you used last time    Pertinent History Gabrielle Malone is a 66 y.o. female who had on 10/13/20 -  Dupuytren's contracture with release of a right long trigger finger by Dr Roland Rack.  Overall, the patient feels that she is doing reasonably well. She still notes some stiffness/soreness in the long finger and hand. The soreness is worse in the afternoons. She has been taking naproxen as necessary with temporary partial relief  of her symptoms as well as applying heat. She still also notes some "tingling" along the middle finger. She has been performing exercises on her own at home. Refer o OT Halford Decamp threrapy    Patient Stated Goals To be able to get full motion in my hand and middle finger so I can use it normally and pain/tenderness better    Currently in Pain? Yes    Pain Score 4     Pain Location Finger (Comment which one)    Pain Orientation Right    Pain Descriptors / Indicators Aching;Tightness    Pain Type Surgical pain    Pain Onset More than a month ago    Pain Frequency Intermittent    Aggravating Factors  flexion of 3rd                [facility] OT Assessment - 12/26/20 0001       Right Hand AROM   R Long  MCP 0-90 90 Degrees   0   R Long PIP 0-100 80 Degrees             Pt since last time showed increase PIP flexion and increase extention - less pain over scar and edema  But still edema in  proximal phalanges and PIP of 3rd -  Pain decrease with extention of 3rd and MC flexion - pain still with intrinsic fist and composite          OT Treatments/Exercises (OP) - 12/26/20 0001       Ultrasound   Ultrasound Location volar palmar scar    Ultrasound Parameters 3.3 mhz, 20%, 0.8 intensity    Ultrasound Goals Edema;Pain      RUE Paraffin   Number Minutes Paraffin 8 Minutes    RUE Paraffin Location Hand    Comments prior to soft tissue and ROM               Review with pt HEP again and change    Scar massage done using mini massager and xtractor with flexion of 3rd digit- prior  to ROM  Scar massage manual by OT With extention  stretch to 3rd digit cica scar pad for night time and isotoner glove for decrease edema in 3rd digit - night time  Rolling over red foam roller for massage and extention of digist 20 reps pain free Blocked tendon glides- - but PROM to DIP /PIP flexion gentle pain free - prior to intrinsic fist and composite fist to foam roller pain free  - Opposition pick up 1 cm foam block pain free  2-3  x day Enlarge her grip at the moment when gripping objects - hold of on squeezing object            OT Education - 12/26/20 1817     Education Details HEP and progress    Person(s) Educated Patient    Methods Explanation;Demonstration;Tactile cues;Verbal cues;Handout    Comprehension Verbal cues required;Returned demonstration;Verbalized understanding              OT Short Term Goals - 12/16/20 1240       OT SHORT TERM GOAL #1   Title Pt to be independeint in HEP to decrease scar tissue, edema and pain to touch palm with 3rd digit    Baseline pain 3/10 and scar tissue thick and tender - Flexion of PIP 70 , ext -25 at [facility] at 3rd digit    Time 3    Period Weeks    Status New    Target Date 01/06/21               OT Long Term Goals - 12/16/20 1241       OT LONG TERM GOAL #1   Title Pt to make composite fist with R hand touching palm without increase symptoms    Baseline pain and scar tissue 3/10 - and tender - flexion of 3rdPIP 70 - edema 0.7 at PIP    Time 4    Period Weeks    Status New    Target Date 01/13/21      OT LONG TERM GOAL #2   Title R grip and prehension strength increase to WNL compare to L to carry groceries, kayak and use tools without symptoms    Baseline GRip R 34, L 46 ; lat and 3 point decrease by 2 lbs compare to L - pt is R hand dominant - tender and thick scar tissue - symptoms increase 3/10    Time 6    Period Weeks    Status New    Target Date 01/27/21      OT LONG TERM GOAL #3   Title 3rd digit extention increase to WNL to put hand in pocket and no  issue typing on computer    Baseline scar tissue tender and pain in palm 3/10 - MC extention of 3rd -25    Time 5    Period Weeks    Status New    Target Date 01/20/21                   Plan - 12/26/20 1817     Clinical Impression Statement Pt about 9 1/2  wks s/p R 3rd digit trigger finger and dupuytrens release- pt  show increase scar tissue and adhesionsin palm , decrease ext of MC  this date compare to last week - and cont to show decrease 3rd digit  PIP flexion and composite flexion - 4/10 pain in palm - cont to have increase edema -and pain in palm - decrease grip and prehension limiting her functional use of R dominant hand - pt can benefit from OT services - ? scar adhesion on tendon - pt did not start therapy until 8 wks s/p - 3 wks after referral to OT    OT Occupational Profile and History Problem Focused Assessment - Including review of records relating to presenting problem    Occupational performance deficits (Please refer to evaluation for details): ADL's;IADL's;Work;Play;Leisure;Social Participation    Body Structure / Function / Physical Skills ADL;Strength;Pain;Edema;UE functional use;ROM;Scar mobility;Flexibility;FMC;Decreased knowledge of precautions    Rehab Potential Good    Clinical Decision Making Limited treatment options, no task modification necessary    Comorbidities Affecting Occupational Performance: None    Modification or Assistance to Complete Evaluation  No modification of tasks or assist necessary to complete eval    OT Frequency 1x / week    OT Duration 6 weeks    OT Treatment/Interventions Self-care/ADL training;Moist Heat;Splinting;Manual Therapy;Paraffin;Passive range of motion;Scar mobilization;Therapeutic exercise;Contrast Bath    Consulted and Agree with Plan of Care Patient             Patient will benefit from skilled therapeutic intervention in order to improve the following deficits and impairments:   Body Structure / Function / Physical Skills: ADL, Strength, Pain, Edema, UE functional use, ROM, Scar mobility, Flexibility, FMC, Decreased knowledge of precautions       Visit Diagnosis: Pain in right hand  Stiffness of right hand, not elsewhere classified  Scar condition and fibrosis of skin  Muscle weakness (generalized)    Problem List Patient  Active Problem List   Diagnosis Date Noted   Anxiety 04/23/2017   Depression 04/23/2017   Gestational diabetes 04/23/2017   Endometriosis 04/23/2017   Menstrual problem 04/23/2017   Status post normal childbirth 04/23/2017   Physical exam, organ donor 02/05/2013   Pre-op evaluation 02/05/2013   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    Rosalyn Gess OTR/L,CLT 12/26/2020, 6:21 PM  [facility] [address], [place], [address] Phone: [phone]   Fax:  [phone]  Name: Gabrielle Malone MRN: [id number] Date of Birth: 06-24-1956

## 2020-12-29 ENCOUNTER — Ambulatory Visit: Payer: BC Managed Care – PPO | Admitting: Occupational Therapy

## 2020-12-29 DIAGNOSIS — L905 Scar conditions and fibrosis of skin: Secondary | ICD-10-CM

## 2020-12-29 DIAGNOSIS — M79641 Pain in right hand: Secondary | ICD-10-CM

## 2020-12-29 DIAGNOSIS — M25641 Stiffness of right hand, not elsewhere classified: Secondary | ICD-10-CM

## 2020-12-29 NOTE — Therapy (Signed)
[facility] [address] [place], [address] Phone: [phone]   Fax:  [phone]  Occupational Therapy Treatment  Patient Details  Name: Gabrielle Malone MRN: [id number] Date of Birth: Jun 13, 1956 Referring Provider (OT): DR Poggi   Encounter Date: 12/29/2020   OT End of Session - 12/29/20 1504     Visit Number 4    Number of Visits 6    Date for OT Re-Evaluation 01/27/21    OT Start Time 1451    OT Stop Time 1525    OT Time Calculation (min) 34 min    Activity Tolerance Patient tolerated treatment well    Behavior During Therapy [facility] for tasks assessed/performed             Past Medical History:  Diagnosis Date   Anxiety    Complication of anesthesia    Remembers part of surgery from rhinoplasty   Depression    Endometriosis    GERD (gastroesophageal reflux disease)    Insomnia     Past Surgical History:  Procedure Laterality Date   carpal tunnel     CESAREAN SECTION     GASTRIC BYPASS     OOPHORECTOMY     RHINOPLASTY     TRIGGER FINGER RELEASE Right 10/13/2020   Procedure: RIGHT MIDDLE FINGER 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;  Surgeon: Corky Mull, MD;  Location: [facility] ORS;  Service: Orthopedics;  Laterality: Right;    There were no vitals filed for this visit.   Subjective Assessment - 12/29/20 1502     Subjective  I opened my paraffin and did not come with pararffin -ordered some -uncomfortable pull with making fist - more than pain- just cannot make tigth fist    Pertinent History Gabrielle Malone is a 66 y.o. female who had on 10/13/20 -  Dupuytren's contracture with release of a right long trigger finger by Dr Roland Rack.  Overall, the patient feels that she is doing reasonably well. She still notes some stiffness/soreness in the long finger and hand. The soreness is worse in the afternoons. She has been taking naproxen as necessary with temporary partial  relief of her symptoms as well as applying heat. She still also notes some "tingling" along the middle finger. She has been performing exercises on her own at home. Refer o OT Halford Decamp threrapy    Patient Stated Goals To be able to get full motion in my hand and middle finger so I can use it normally and pain/tenderness better    Currently in Pain? Yes    Pain Score 3     Pain Location Hand    Pain Orientation Right    Pain Descriptors / Indicators Aching    Pain Onset More than a month ago    Pain Frequency Intermittent                [facility] OT Assessment - 12/29/20 0001       Right Hand AROM   R Long  MCP 0-90 95 Degrees   0 for ext, in session 90 with PIP 90   R Long PIP 0-100 80 Degrees              Pt MC wants to flex to 95-100 and decrease PIP flexion - pt to block MC at 90 and put leverage to PIP  In session got 90 at PIP  And extention still 0  OT Treatments/Exercises (OP) - 12/29/20 0001       RUE Paraffin   Number Minutes Paraffin 8 Minutes    RUE Paraffin Location Hand    Comments prior to soft tissue - AROM            Review with pt HEP again and change    Scar massage done using mini massager and xtractor with flexion of 3rd digit- prior  to ROM Scar massage manual by OT And pt can get mini massager it she wants - but not over do and ed on use  With extention  stretch to 3rd digit cica scar pad for night time and isotoner glove for decrease edema in 3rd digit - night time  Rolling over red foam roller for massage and extention of digist 20 reps pain free Blocked tendon glides- - but PROM to DIP /PIP flexion gentle pain free - prior to intrinsic fist  composite fist  with pen in palm to block MC at 90  Opposition pick up 1 cm foam block pain free  2-3  x day Enlarge her grip at the moment when gripping objects - hold of on squeezing object               OT Education - 12/29/20 1504     Education Details HEP and progress     Person(s) Educated Patient    Methods Explanation;Demonstration;Tactile cues;Verbal cues;Handout    Comprehension Verbal cues required;Returned demonstration;Verbalized understanding              OT Short Term Goals - 12/16/20 1240       OT SHORT TERM GOAL #1   Title Pt to be independeint in HEP to decrease scar tissue, edema and pain to touch palm with 3rd digit    Baseline pain 3/10 and scar tissue thick and tender - Flexion of PIP 70 , ext -25 at [facility] at 3rd digit    Time 3    Period Weeks    Status New    Target Date 01/06/21               OT Long Term Goals - 12/16/20 1241       OT LONG TERM GOAL #1   Title Pt to make composite fist with R hand touching palm without increase symptoms    Baseline pain and scar tissue 3/10 - and tender - flexion of 3rdPIP 70 - edema 0.7 at PIP    Time 4    Period Weeks    Status New    Target Date 01/13/21      OT LONG TERM GOAL #2   Title R grip and prehension strength increase to WNL compare to L to carry groceries, kayak and use tools without symptoms    Baseline GRip R 34, L 46 ; lat and 3 point decrease by 2 lbs compare to L - pt is R hand dominant - tender and thick scar tissue - symptoms increase 3/10    Time 6    Period Weeks    Status New    Target Date 01/27/21      OT LONG TERM GOAL #3   Title 3rd digit extention increase to WNL to put hand in pocket and no issue typing on computer    Baseline scar tissue tender and pain in palm 3/10 - MC extention of 3rd -25    Time 5    Period Weeks    Status New  Target Date 01/20/21                   Plan - 12/29/20 1505     Clinical Impression Statement Pt about 10  wks s/p R 3rd digit trigger finger and dupuytrens release- pt show increase scar tissue and adhesionsin palm. Pt did show improvement the last 2 visiits in scar adhesions , extention of 3rd and flexion of 3rd - pt to block MC at 90 during composite flexion to put leverage to PIP - insession had 90 at  PIP and MC - pain less than 3/10 - cont to have increase edema -and pain in palm - decrease grip and prehension limiting her functional use of R dominant hand - pt can benefit from OT services - ? scar adhesion on tendon - pt did not start therapy until 8 wks s/p - 3 wks after referral to OT    OT Occupational Profile and History Problem Focused Assessment - Including review of records relating to presenting problem    Occupational performance deficits (Please refer to evaluation for details): ADL's;IADL's;Work;Play;Leisure;Social Participation    Body Structure / Function / Physical Skills ADL;Strength;Pain;Edema;UE functional use;ROM;Scar mobility;Flexibility;FMC;Decreased knowledge of precautions    Rehab Potential Good    Clinical Decision Making Limited treatment options, no task modification necessary    Comorbidities Affecting Occupational Performance: None    Modification or Assistance to Complete Evaluation  No modification of tasks or assist necessary to complete eval    OT Frequency 1x / week    OT Duration 6 weeks    OT Treatment/Interventions Self-care/ADL training;Moist Heat;Splinting;Manual Therapy;Paraffin;Passive range of motion;Scar mobilization;Therapeutic exercise;Contrast Bath    Consulted and Agree with Plan of Care Patient             Patient will benefit from skilled therapeutic intervention in order to improve the following deficits and impairments:   Body Structure / Function / Physical Skills: ADL, Strength, Pain, Edema, UE functional use, ROM, Scar mobility, Flexibility, FMC, Decreased knowledge of precautions       Visit Diagnosis: Pain in right hand  Stiffness of right hand, not elsewhere classified  Scar condition and fibrosis of skin    Problem List Patient Active Problem List   Diagnosis Date Noted   Anxiety 04/23/2017   Depression 04/23/2017   Gestational diabetes 04/23/2017   Endometriosis 04/23/2017   Menstrual problem 04/23/2017   Status  post normal childbirth 04/23/2017   Physical exam, organ donor 02/05/2013   Pre-op evaluation 02/05/2013   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    Rosalyn Gess OTR/L,CLT 12/29/2020, 3:29 PM  [facility] [address] [place], [address] Phone: [phone]   Fax:  [phone]  Name: Gabrielle Malone MRN: [id number] Date of Birth: 06/29/1956

## 2021-01-03 ENCOUNTER — Ambulatory Visit: Payer: BC Managed Care – PPO | Admitting: Occupational Therapy

## 2021-01-03 DIAGNOSIS — M25641 Stiffness of right hand, not elsewhere classified: Secondary | ICD-10-CM

## 2021-01-03 DIAGNOSIS — L905 Scar conditions and fibrosis of skin: Secondary | ICD-10-CM

## 2021-01-03 DIAGNOSIS — M79641 Pain in right hand: Secondary | ICD-10-CM

## 2021-01-03 DIAGNOSIS — M6281 Muscle weakness (generalized): Secondary | ICD-10-CM

## 2021-01-03 NOTE — Therapy (Signed)
[place] [address] [place], [address] Phone: [phone]   Fax:  [phone]  Occupational Therapy Treatment  Patient Details  Name: Gabrielle Malone MRN: [id number] Date of Birth: 06/12/1956 Referring Provider (OT): DR Poggi   Encounter Date: 01/03/2021   OT End of Session - 01/03/21 1444     Visit Number 5    Number of Visits 6    Date for OT Re-Evaluation 01/27/21    OT Start Time 1408    OT Stop Time 1445    OT Time Calculation (min) 37 min    Activity Tolerance Patient tolerated treatment well    Behavior During Therapy [facility] for tasks assessed/performed             Past Medical History:  Diagnosis Date   Anxiety    Complication of anesthesia    Remembers part of surgery from rhinoplasty   Depression    Endometriosis    GERD (gastroesophageal reflux disease)    Insomnia     Past Surgical History:  Procedure Laterality Date   carpal tunnel     CESAREAN SECTION     GASTRIC BYPASS     OOPHORECTOMY     RHINOPLASTY     TRIGGER FINGER RELEASE Right 10/13/2020   Procedure: RIGHT MIDDLE FINGER 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;  Surgeon: Corky Mull, MD;  Location: [facility] ORS;  Service: Orthopedics;  Laterality: Right;    There were no vitals filed for this visit.   Subjective Assessment - 01/03/21 1429     Subjective  Did use some paraffin - and then the massager -but did not use it yet- pain more in my L thumb now but still stiffness mostly in middle finger on the R    Pertinent History Gabrielle Malone is a 65 y.o. female who had on 10/13/20 -  Dupuytren's contracture with release of a right long trigger finger by Dr Roland Rack.  Overall, the patient feels that she is doing reasonably well. She still notes some stiffness/soreness in the long finger and hand. The soreness is worse in the afternoons. She has been taking naproxen as necessary with temporary partial  relief of her symptoms as well as applying heat. She still also notes some "tingling" along the middle finger. She has been performing exercises on her own at home. Refer o OT Halford Decamp threrapy    Patient Stated Goals To be able to get full motion in my hand and middle finger so I can use it normally and pain/tenderness better    Currently in Pain? No/denies                [facility] OT Assessment - 01/03/21 0001       Strength   Right Hand Grip (lbs) 36    Right Hand Lateral Pinch 11 lbs    Right Hand 3 Point Pinch 8 lbs    Left Hand Grip (lbs) 46    Left Hand Lateral Pinch 10 lbs    Left Hand 3 Point Pinch 8 lbs            assess grip and prehension - 3 point pinch increase - grip increase 2 lbs  Pt MC wants to cont  flex to 95-100  pt to cont to  block MC at 90 with pen and put leverage to PIP In session Rogers 90 to 95 and PIP 95 - did DIP/PIP strap to increase composite  fist and DIP flexion at 3rd - pt to wear only for 2 min at time prior to block intrinsic fist and composite fist  And extention still 0                                    OT Treatments/Exercises (OP) - 01/03/21 0001       RUE Paraffin   Number Minutes Paraffin 8 Minutes    RUE Paraffin Location Hand    Comments prior to soft tissue               Scar massage done using mini massager and xtractor with flexion of 3rd digit- prior  to ROM Scar massage manual by OT And pt did get  mini massager - but only use 2 min at time and do not slide it  - but not over do and ed on use   With extention  stretch to 3rd digit cica scar pad for night time and isotoner glove for decrease edema in 3rd digit - night time  Rolling over red foam roller for massage and extention of digist 20 reps pain free Blocked tendon glides- - DIP/PIP strap used and then - prior to intrinsic fist composite fist  with pen in palm to block MC at 90  Opposition pick up 1 cm foam block pain free  2-3  x day Enlarge her  grip at the moment when gripping objects - hold of on squeezing object         OT Education - 01/03/21 1444     Education Details HEP and progress    Person(s) Educated Patient    Methods Explanation;Demonstration;Tactile cues;Verbal cues;Handout    Comprehension Verbal cues required;Returned demonstration;Verbalized understanding              OT Short Term Goals - 12/16/20 1240       OT SHORT TERM GOAL #1   Title Pt to be independeint in HEP to decrease scar tissue, edema and pain to touch palm with 3rd digit    Baseline pain 3/10 and scar tissue thick and tender - Flexion of PIP 70 , ext -25 at [facility] at 3rd digit    Time 3    Period Weeks    Status New    Target Date 01/06/21               OT Long Term Goals - 12/16/20 1241       OT LONG TERM GOAL #1   Title Pt to make composite fist with R hand touching palm without increase symptoms    Baseline pain and scar tissue 3/10 - and tender - flexion of 3rdPIP 70 - edema 0.7 at PIP    Time 4    Period Weeks    Status New    Target Date 01/13/21      OT LONG TERM GOAL #2   Title R grip and prehension strength increase to WNL compare to L to carry groceries, kayak and use tools without symptoms    Baseline GRip R 34, L 46 ; lat and 3 point decrease by 2 lbs compare to L - pt is R hand dominant - tender and thick scar tissue - symptoms increase 3/10    Time 6    Period Weeks    Status New    Target Date 01/27/21      OT LONG TERM GOAL #  3   Title 3rd digit extention increase to WNL to put hand in pocket and no issue typing on computer    Baseline scar tissue tender and pain in palm 3/10 - MC extention of 3rd -25    Time 5    Period Weeks    Status New    Target Date 01/20/21                   Plan - 01/03/21 1445     Clinical Impression Statement Pt about 11  wks s/p R 3rd digit trigger finger and dupuytrens release- pt cont to show increase scar tissue and adhesionsin palm. Pt did show improvement  the last 3 visiits in scar adhesions , extention and flexion of 3rd digit - pt to cont to block MC at 90 during composite flexion to put leverage to PIP -  did add this date DIP/PIP strap to encourage more PIP and DIP flexion during composite fist - less pain and more stiffness this date compare to the past  - prehension increase but grip still decrease limiting her use of R dominant hand - pt can benefit from OT services -    OT Occupational Profile and History Problem Focused Assessment - Including review of records relating to presenting problem    Occupational performance deficits (Please refer to evaluation for details): ADL's;IADL's;Work;Play;Leisure;Social Participation    Body Structure / Function / Physical Skills ADL;Strength;Pain;Edema;UE functional use;ROM;Scar mobility;Flexibility;FMC;Decreased knowledge of precautions    Rehab Potential Good    Clinical Decision Making Limited treatment options, no task modification necessary    Comorbidities Affecting Occupational Performance: None    Modification or Assistance to Complete Evaluation  No modification of tasks or assist necessary to complete eval    OT Frequency 1x / week    OT Duration 6 weeks    OT Treatment/Interventions Self-care/ADL training;Moist Heat;Splinting;Manual Therapy;Paraffin;Passive range of motion;Scar mobilization;Therapeutic exercise;Contrast Bath    Consulted and Agree with Plan of Care Patient             Patient will benefit from skilled therapeutic intervention in order to improve the following deficits and impairments:   Body Structure / Function / Physical Skills: ADL, Strength, Pain, Edema, UE functional use, ROM, Scar mobility, Flexibility, FMC, Decreased knowledge of precautions       Visit Diagnosis: Pain in right hand  Scar condition and fibrosis of skin  Muscle weakness (generalized)  Stiffness of right hand, not elsewhere classified    Problem List Patient Active Problem List    Diagnosis Date Noted   Anxiety 04/23/2017   Depression 04/23/2017   Gestational diabetes 04/23/2017   Endometriosis 04/23/2017   Menstrual problem 04/23/2017   Status post normal childbirth 04/23/2017   Physical exam, organ donor 02/05/2013   Pre-op evaluation 02/05/2013   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    Rosalyn Gess OTR/L,CLT 01/03/2021, 2:48 PM  [facility] [facility] [address], [place], [address] Phone: [phone]   Fax:  [phone]  Name: Gabrielle Malone MRN: [id number] Date of Birth: [birth year]

## 2021-01-05 ENCOUNTER — Ambulatory Visit: Payer: BC Managed Care – PPO | Admitting: Occupational Therapy

## 2021-01-12 ENCOUNTER — Ambulatory Visit: Payer: BC Managed Care – PPO | Attending: Surgery | Admitting: Occupational Therapy

## 2021-01-12 DIAGNOSIS — M25641 Stiffness of right hand, not elsewhere classified: Secondary | ICD-10-CM | POA: Diagnosis present

## 2021-01-12 DIAGNOSIS — L905 Scar conditions and fibrosis of skin: Secondary | ICD-10-CM | POA: Insufficient documentation

## 2021-01-12 DIAGNOSIS — M79641 Pain in right hand: Secondary | ICD-10-CM | POA: Insufficient documentation

## 2021-01-12 DIAGNOSIS — M6281 Muscle weakness (generalized): Secondary | ICD-10-CM

## 2021-01-12 NOTE — Therapy (Signed)
[place] [facility] [address] [place], [address] Phone: [phone]   Fax:  [phone]  Occupational Therapy Treatment  Patient Details  Name: Gabrielle Malone MRN: [id number] Date of Birth: Aug 07, 1955 Referring Provider (OT): DR Roland Rack   Encounter Date: 01/12/2021   OT End of Session - 01/12/21 1325     Visit Number 6    Number of Visits 9    Date for OT Re-Evaluation 02/02/21    OT Start Time 58    OT Stop Time 1345    OT Time Calculation (min) 36 min    Activity Tolerance Patient tolerated treatment well    Behavior During Therapy Gabrielle Malone for tasks assessed/performed             Past Medical History:  Diagnosis Date   Anxiety    Complication of anesthesia    Remembers part of surgery from rhinoplasty   Depression    Endometriosis    GERD (gastroesophageal reflux disease)    Insomnia     Past Surgical History:  Procedure Laterality Date   carpal tunnel     CESAREAN SECTION     GASTRIC BYPASS     OOPHORECTOMY     RHINOPLASTY     TRIGGER FINGER RELEASE Right 10/13/2020   Procedure: RIGHT MIDDLE FINGER 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;  Surgeon: Corky Mull, MD;  Location: [facility] ORS;  Service: Orthopedics;  Laterality: Right;    There were no vitals filed for this visit.   Subjective Assessment - 01/12/21 1315     Subjective  Tried to play some guitar but maybe not good for my thumb on the L and drawering bothered me - pain not as much more stiff and tight the middle finger    Pertinent History Gabrielle Malone is a 65 y.o. female who had on 10/13/20 -  Dupuytren's contracture with release of a right long trigger finger by Dr Roland Rack.  Overall, the patient feels that she is doing reasonably well. She still notes some stiffness/soreness in the long finger and hand. The soreness is worse in the afternoons. She has been taking naproxen as necessary with temporary partial  relief of her symptoms as well as applying heat. She still also notes some "tingling" along the middle finger. She has been performing exercises on her own at home. Refer o OT Halford Decamp threrapy    Patient Stated Goals To be able to get full motion in my hand and middle finger so I can use it normally and pain/tenderness better    Currently in Pain? Yes    Pain Score 3     Pain Location Hand    Pain Orientation Right    Pain Descriptors / Indicators Aching    Pain Type Surgical pain    Pain Onset More than a month ago    Pain Frequency Intermittent                [facility] OT Assessment - 01/12/21 0001       Strength   Right Hand Grip (lbs) 37             MC flexion 90, PIP 95 but no to very little DIP flexion Extention volar side 0         OT Treatments/Exercises (OP) - 01/12/21 0001       RUE Paraffin   Number Minutes Paraffin 8 Minutes    RUE Paraffin Location Hand  Comments prior to soft tissue  and ROM             Scar massage done using mini massager and xtractor with flexion of 3rd digit- prior  to ROM Scar massage manual by OT And pt did get  mini massager - but only use 2 min at time and do not slide it  - but not over do and ed on use   With extention  stretch to 3rd digit cica scar pad for night time and isotoner glove for decrease edema in 3rd digit - night time  Rolling over red foam roller for massage and extention of digist 20 reps pain free Blocked tendon glides- - DIP/PIP strap used and then - prior to intrinsic fist composite fist  with pen in palm to block MC at 90  Opposition pick up 1 cm foam block pain free  2-3  x day Enlarge her grip at the moment when gripping objects - hold of on squeezing object  Follow up with surgeon next week- if any scar adhesions - limiting composite fist - and DIP flexion          OT Education - 01/12/21 1325     Education Details HEP and progress    Person(s) Educated Patient    Methods  Explanation;Demonstration;Tactile cues;Verbal cues;Handout    Comprehension Verbal cues required;Returned demonstration;Verbalized understanding              OT Short Term Goals - 01/12/21 1356       OT SHORT TERM GOAL #1   Title Pt to be independeint in HEP to decrease scar tissue, edema and pain to touch palm with 3rd digit    Baseline pain 3/10 and scar tissue thick and tender - Flexion of PIP 70 , ext -25 at [facility] at 3rd digit  NOW MC 90,PIP 95 , extnetion 0    Status Achieved               OT Long Term Goals - 01/12/21 1356       OT LONG TERM GOAL #1   Title Pt to make composite fist with R hand touching palm without increase symptoms    Baseline pain and scar tissue 3/10 - and tender - flexion of 3rdPIP 70 - edema 0.7 at PIP  NOW - can touch palm but not tight fist and DIP flexion    Time 1    Target Date 01/13/21      OT LONG TERM GOAL #2   Title R grip and prehension strength increase to WNL compare to L to carry groceries, kayak and use tools without symptoms    Baseline GRip R 34, L 46 ; lat and 3 point decrease by 2 lbs compare to L - pt is R hand dominant - tender and thick scar tissue - symptoms increase 3/10  NOW no pain just tight and grip increase 37 lbs R    Time 2    Period Weeks    Status On-going    Target Date 01/27/21      OT LONG TERM GOAL #3   Title 3rd digit extention increase to WNL to put hand in pocket and no issue typing on computer    Baseline scar tissue tender and pain in palm 3/10 - MC extention of 3rd -25 NOW 0 extention - not hyper extention - can puthand in pocket and donn glove    Status Achieved  Plan - 01/12/21 1327     Clinical Impression Statement Pt about 3 months s/p R 3rd digit trigger finger and dupuytrens release- pt cont to show increase scar tissue and adhesionsin palm. Pt did show improvement the last 4 visiits in scar adhesions , extention and flexion of 3rd digit - pt MC flexion 90 and PIP 95   - con to show decrease DIP flexion and composite flexion of DIP/PIP to touch palm - pt to cont doing  DIP/PIP strap to encourage more PIP and DIP flexion during composite fist - less pain and more stiffness this past 2 weeks  compare to the past  - prehension strenght WFL but  grip still decrease limiting her use of R dominant hand - pt to follow up with surgeon next week    OT Occupational Profile and History Problem Focused Assessment - Including review of records relating to presenting problem    Occupational performance deficits (Please refer to evaluation for details): ADL's;IADL's;Work;Play;Leisure;Social Participation    Body Structure / Function / Physical Skills ADL;Strength;Pain;Edema;UE functional use;ROM;Scar mobility;Flexibility;FMC;Decreased knowledge of precautions    Rehab Potential Good    Clinical Decision Making Limited treatment options, no task modification necessary    Comorbidities Affecting Occupational Performance: None    Modification or Assistance to Complete Evaluation  No modification of tasks or assist necessary to complete eval    OT Frequency 1x / week    OT Duration 6 weeks    OT Treatment/Interventions Self-care/ADL training;Moist Heat;Splinting;Manual Therapy;Paraffin;Passive range of motion;Scar mobilization;Therapeutic exercise;Contrast Bath    Consulted and Agree with Plan of Care Patient             Patient will benefit from skilled therapeutic intervention in order to improve the following deficits and impairments:   Body Structure / Function / Physical Skills: ADL, Strength, Pain, Edema, UE functional use, ROM, Scar mobility, Flexibility, FMC, Decreased knowledge of precautions       Visit Diagnosis: Pain in right hand  Scar condition and fibrosis of skin  Muscle weakness (generalized)  Stiffness of right hand, not elsewhere classified    Problem List Patient Active Problem List   Diagnosis Date Noted   Anxiety 04/23/2017   Depression  04/23/2017   Gestational diabetes 04/23/2017   Endometriosis 04/23/2017   Menstrual problem 04/23/2017   Status post normal childbirth 04/23/2017   Physical exam, organ donor 02/05/2013   Pre-op evaluation 02/05/2013   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    Lucrezia Dehne, Gwenette Greet 01/12/21 01/12/2021, 2:00 PM  [place] [facility] [address], [place], [address] Phone: [phone]   Fax:  [phone]  Name: Gabrielle Malone MRN: [id number] Date of Birth: May 09, 1956

## 2021-01-30 ENCOUNTER — Ambulatory Visit: Payer: BC Managed Care – PPO | Admitting: Occupational Therapy

## 2021-01-30 DIAGNOSIS — M6281 Muscle weakness (generalized): Secondary | ICD-10-CM

## 2021-01-30 DIAGNOSIS — L905 Scar conditions and fibrosis of skin: Secondary | ICD-10-CM

## 2021-01-30 DIAGNOSIS — M79641 Pain in right hand: Secondary | ICD-10-CM | POA: Diagnosis not present

## 2021-01-30 DIAGNOSIS — M25641 Stiffness of right hand, not elsewhere classified: Secondary | ICD-10-CM

## 2021-01-30 NOTE — Therapy (Signed)
[facility] [address], [place], [address] Phone: [phone]   Fax:  [phone]  Occupational Therapy Treatment  Patient Details  Name: Gabrielle Malone MRN: [id number] Date of Birth: [birth year] Referring Provider (OT): DR Roland Rack   Encounter Date: 01/30/2021   OT End of Session - 01/30/21 1612     Visit Number 7    Number of Visits 9    Date for OT Re-Evaluation 02/02/21    OT Start Time 1345    OT Stop Time 1430    OT Time Calculation (min) 45 min    Activity Tolerance Patient tolerated treatment well    Behavior During Therapy [facility] for tasks assessed/performed             Past Medical History:  Diagnosis Date   Anxiety    Complication of anesthesia    Remembers part of surgery from rhinoplasty   Depression    Endometriosis    GERD (gastroesophageal reflux disease)    Insomnia     Past Surgical History:  Procedure Laterality Date   carpal tunnel     CESAREAN SECTION     GASTRIC BYPASS     OOPHORECTOMY     RHINOPLASTY     TRIGGER FINGER RELEASE Right 10/13/2020   Procedure: RIGHT MIDDLE FINGER 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;  Surgeon: Corky Mull, MD;  Location: [facility] ORS;  Service: Orthopedics;  Laterality: Right;    There were no vitals filed for this visit.   Subjective Assessment - 01/30/21 1410     Subjective  Seen DR Roland Rack - give me 6 wks to see if improve- so still stiff in middle finger and tight making fist - some pain - L wrist pain still there- Dr said to cont splint on L and Voltaren ointment    Pertinent History Gabrielle Malone is a 65 y.o. female who had on 10/13/20 -  Dupuytren's contracture with release of a right long trigger finger by Dr Roland Rack.  Overall, the patient feels that she is doing reasonably well. She still notes some stiffness/soreness in the long finger and hand. The soreness is worse in the afternoons. She has been taking  naproxen as necessary with temporary partial relief of her symptoms as well as applying heat. She still also notes some "tingling" along the middle finger. She has been performing exercises on her own at home. Refer o OT Halford Decamp threrapy    Patient Stated Goals To be able to get full motion in my hand and middle finger so I can use it normally and pain/tenderness better    Currently in Pain? Yes    Pain Score 3     Pain Location Hand    Pain Orientation Right    Pain Descriptors / Indicators Aching    Pain Type Surgical pain    Pain Onset More than a month ago    Pain Frequency Intermittent                [facility] OT Assessment - 01/30/21 0001       Right Hand AROM   R Long PIP 0-100 95 Degrees   block 90   R Long DIP 0-70 90 Degrees   block MC 90 - 95 - extention 0                     OT Treatments/Exercises (OP) - 01/30/21 0001  RUE Paraffin   Number Minutes Paraffin 8 Minutes    RUE Paraffin Location Hand    Comments prior to soft tissue              Scar massage done using mini massager and xtractor with flexion/ ext of 3rd digit- prior  to ROM Scar massage manual by OT And pt did get  mini massager - but only use 2 min at time and do not slide it  - but not over do and ed on use   With extention  stretch to 3rd digit cica scar pad for night time   Rolling over red foam roller for massage and extention of digist 20 reps pain free  And then review and add table slides for composite extention stretch - 20 reps   Blocked tendon glides- - DIP/PIP strap used and then - prior to intrinsic fist composite fist  with pen in palm to block MC at 90 and then PIP at 95 Opposition pick up 1 cm foam block pain free  2-3  x day Enlarge her grip at the moment when gripping objects - hold of on squeezing object  Pt to start with larger markers or pencils with coloring or doodling and then go smaller and precision  - working gradually up to her drawing again           OT Education - 01/30/21 1612     Education Details HEP and progress    Person(s) Educated Patient    Methods Explanation;Demonstration;Tactile cues;Verbal cues;Handout    Comprehension Verbal cues required;Returned demonstration;Verbalized understanding              OT Short Term Goals - 01/12/21 1356       OT SHORT TERM GOAL #1   Title Pt to be independeint in HEP to decrease scar tissue, edema and pain to touch palm with 3rd digit    Baseline pain 3/10 and scar tissue thick and tender - Flexion of PIP 70 , ext -25 at [facility] at 3rd digit  NOW MC 90,PIP 95 , extnetion 0    Status Achieved               OT Long Term Goals - 01/12/21 1356       OT LONG TERM GOAL #1   Title Pt to make composite fist with R hand touching palm without increase symptoms    Baseline pain and scar tissue 3/10 - and tender - flexion of 3rdPIP 70 - edema 0.7 at PIP  NOW - can touch palm but not tight fist and DIP flexion    Time 1    Target Date 01/13/21      OT LONG TERM GOAL #2   Title R grip and prehension strength increase to WNL compare to L to carry groceries, kayak and use tools without symptoms    Baseline GRip R 34, L 46 ; lat and 3 point decrease by 2 lbs compare to L - pt is R hand dominant - tender and thick scar tissue - symptoms increase 3/10  NOW no pain just tight and grip increase 37 lbs R    Time 2    Period Weeks    Status On-going    Target Date 01/27/21      OT LONG TERM GOAL #3   Title 3rd digit extention increase to WNL to put hand in pocket and no issue typing on computer    Baseline scar tissue tender and pain  in palm 3/10 - MC extention of 3rd -25 NOW 0 extention - not hyper extention - can puthand in pocket and donn glove    Status Achieved                   Plan - 01/30/21 1612     Clinical Impression Statement Pt more than 3 months s/p R 3rd digit trigger finger and dupuytrens release- pt cont to show increase scar tissue and adhesionsin palm.  Pt cont to  show improvement  in flexion and extention of 3rd digit -  and scar adhesions  - pt MC flexion 90 and PIP 95 if MC block at 90 -   cont to show decrease DIP flexion and composite flexion of DIP/PIP to touch palm - pt to cont  to do  DIP/PIP strap to encourage more PIP and DIP flexion during composite fist -  more stiffness than pain compare to the past. Pt this date report increase pain on L wrist - positive for DeQuervain - pain 8-9/10 with thumb ext, wrist flexion and UD - and tenderness /Finkelstein positive - pt in prefab thumb spica to use , ice and Voltaren - contacted DR Roland Rack -provided order to tx L wrist and do Iontophoresis with dexamethazone    OT Occupational Profile and History Problem Focused Assessment - Including review of records relating to presenting problem    Occupational performance deficits (Please refer to evaluation for details): ADL's;IADL's;Work;Play;Leisure;Social Participation    Body Structure / Function / Physical Skills ADL;Strength;Pain;Edema;UE functional use;ROM;Scar mobility;Flexibility;FMC;Decreased knowledge of precautions    Rehab Potential Good    Clinical Decision Making Limited treatment options, no task modification necessary    Comorbidities Affecting Occupational Performance: None    Modification or Assistance to Complete Evaluation  No modification of tasks or assist necessary to complete eval    OT Frequency Biweekly    OT Duration 6 weeks    OT Treatment/Interventions Self-care/ADL training;Moist Heat;Splinting;Manual Therapy;Paraffin;Passive range of motion;Scar mobilization;Therapeutic exercise;Contrast Bath    Consulted and Agree with Plan of Care Patient             Patient will benefit from skilled therapeutic intervention in order to improve the following deficits and impairments:   Body Structure / Function / Physical Skills: ADL, Strength, Pain, Edema, UE functional use, ROM, Scar mobility, Flexibility, FMC, Decreased knowledge  of precautions       Visit Diagnosis: Scar condition and fibrosis of skin  Muscle weakness (generalized)  Stiffness of right hand, not elsewhere classified    Problem List Patient Active Problem List   Diagnosis Date Noted   Anxiety 04/23/2017   Depression 04/23/2017   Gestational diabetes 04/23/2017   Endometriosis 04/23/2017   Menstrual problem 04/23/2017   Status post normal childbirth 04/23/2017   Physical exam, organ donor 02/05/2013   Pre-op evaluation 02/05/2013   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    Gabrielle Malone OTR/L,CLT 01/30/2021, 4:24 PM  [facility] [address] [place], [address] Phone: [phone]   Fax:  [phone]  Name: Gabrielle Malone MRN: [id number] Date of Birth: [birth year]

## 2021-02-13 IMAGING — CT CT HEAD W/O CM
3 series · 15 of 46 positions shown, 18 images · non-contrast
Comparison: None.

CLINICAL DATA: Fall at home

EXAM:
CT HEAD WITHOUT CONTRAST
TECHNIQUE: Contiguous axial images were obtained from the base of the skull
through the vertex without intravenous contrast.

[Series 2: head wo · axial · 0.42mm/px · z∈[-116,+4]mm · 9 of 29 slices shown, 12 images]
[im 3/29  brain]
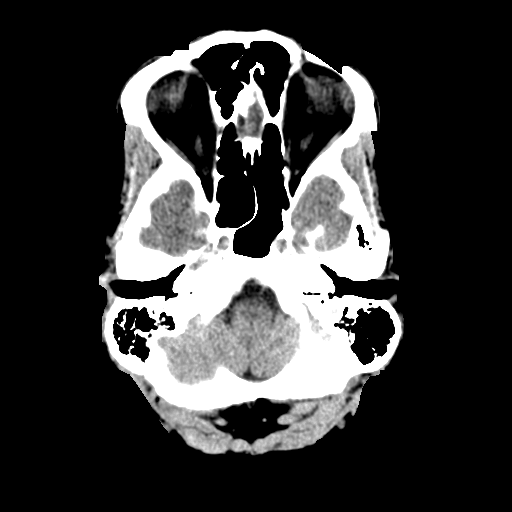
[im 3/29  bone]
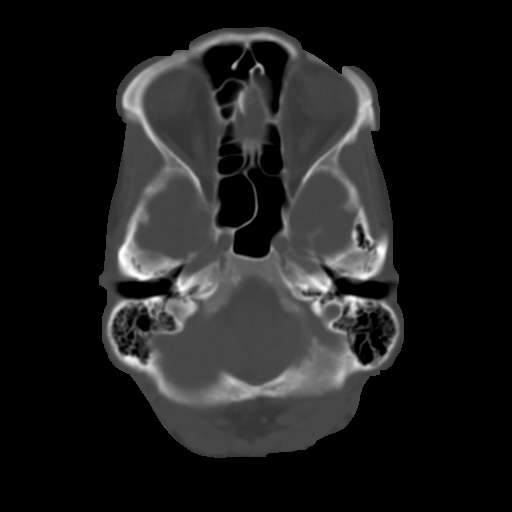
[im 6/29  brain]
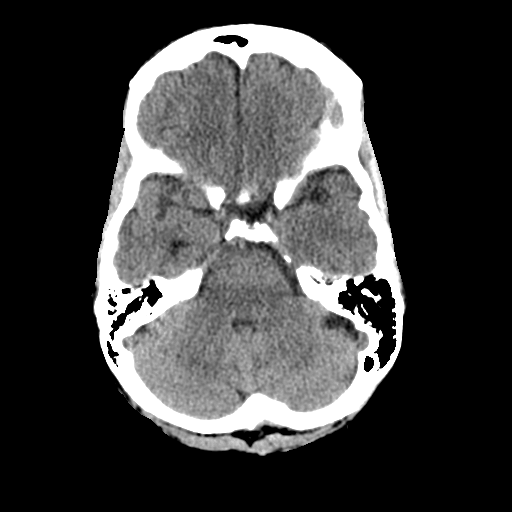
[im 9/29  brain]
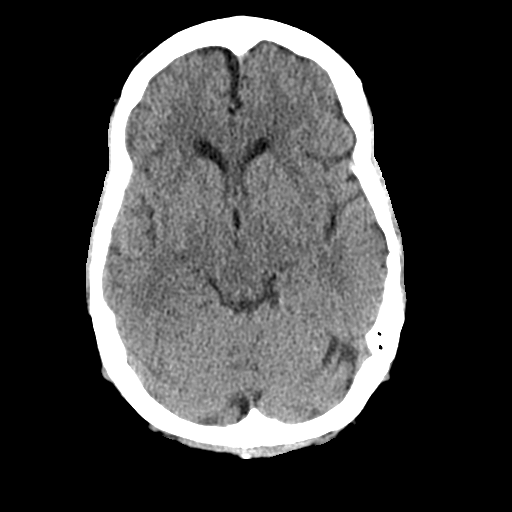
[im 12/29  brain]
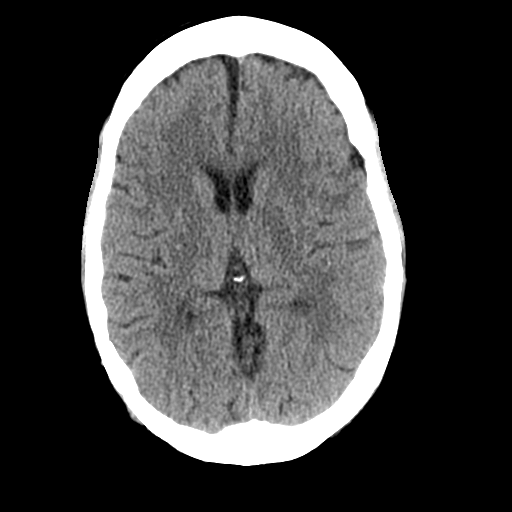
[im 15/29  brain]
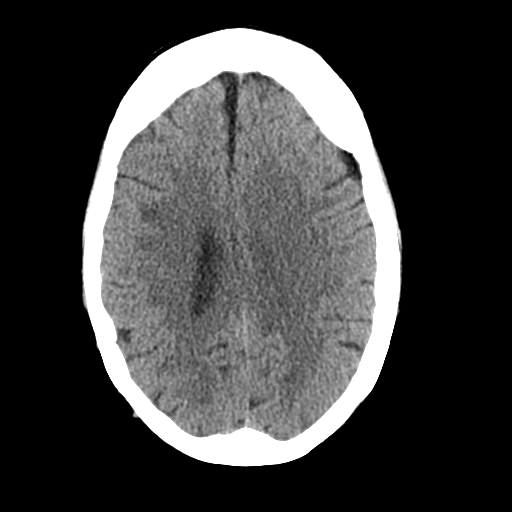
[im 15/29  bone]
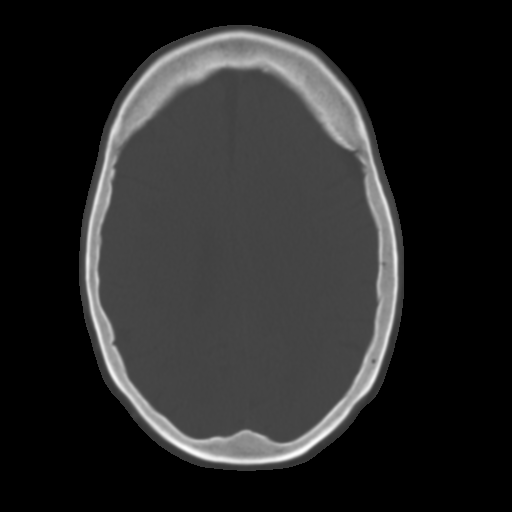
[im 18/29  brain]
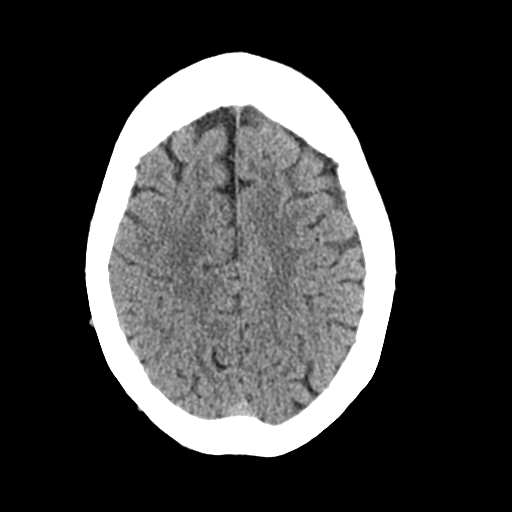
[im 21/29  brain]
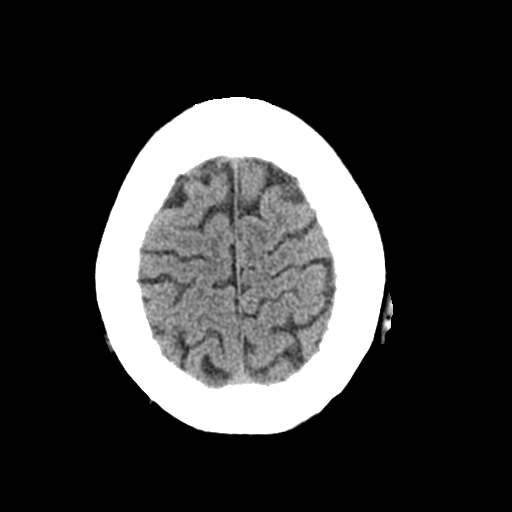
[im 24/29  brain]
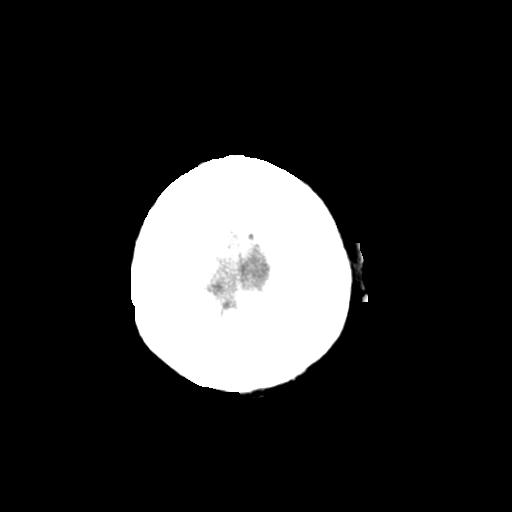
[im 27/29  brain]
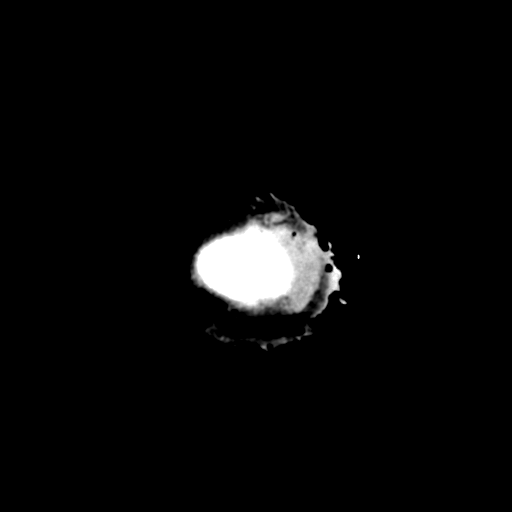
[im 27/29  bone]
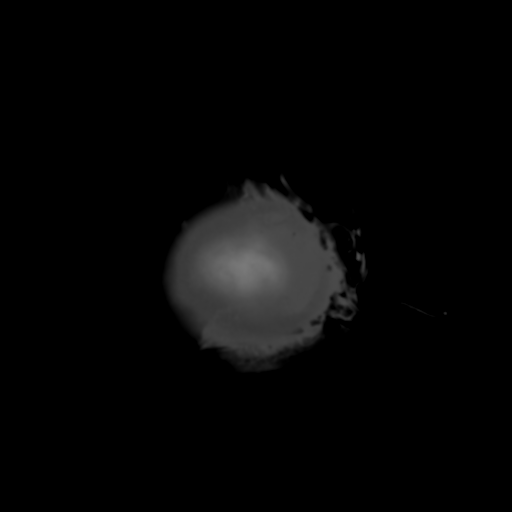

[Series 4: coronal soft tissue · coronal · 0.31mm/px · 3 of 65 slices shown]
[im 22/65  brain]
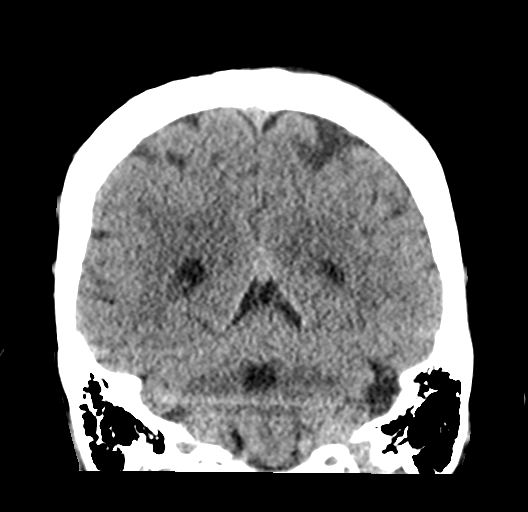
[im 29/65  brain]
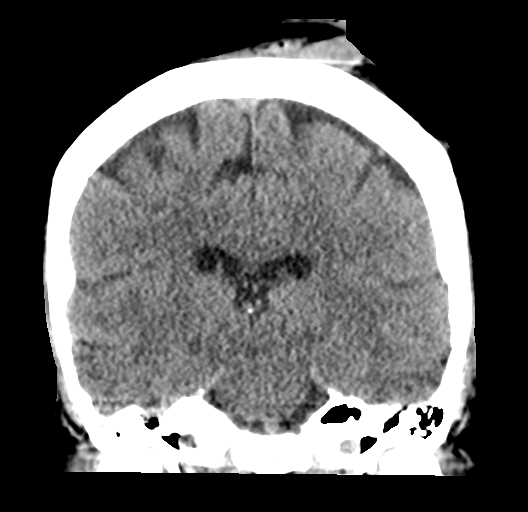
[im 36/65  brain]
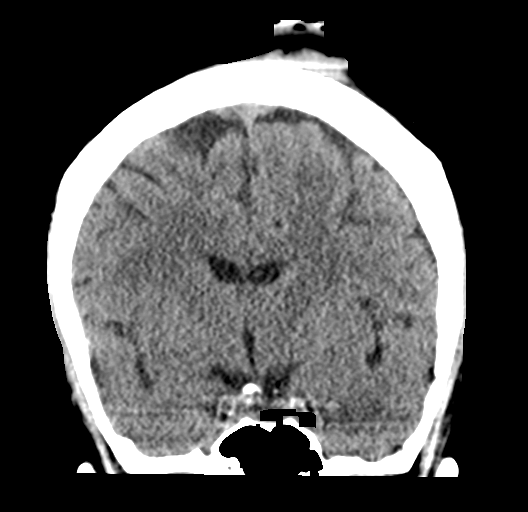

[Series 5: sagittal soft tissue · sagittal · 0.31mm/px · 3 of 48 slices shown]
[im 16/48  brain]
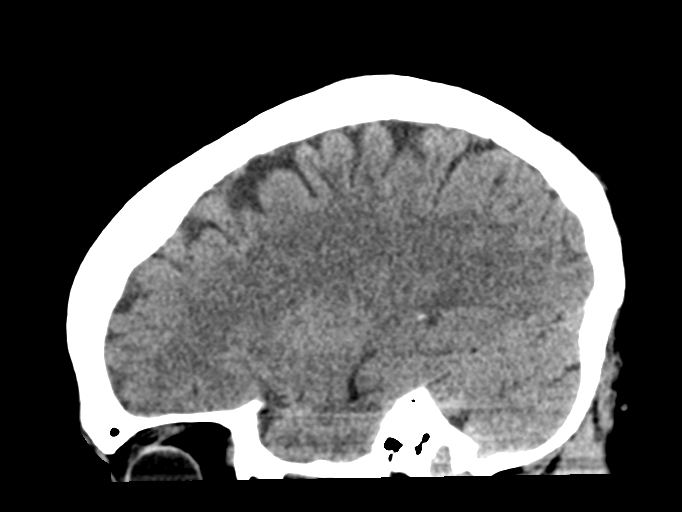
[im 24/48  brain]
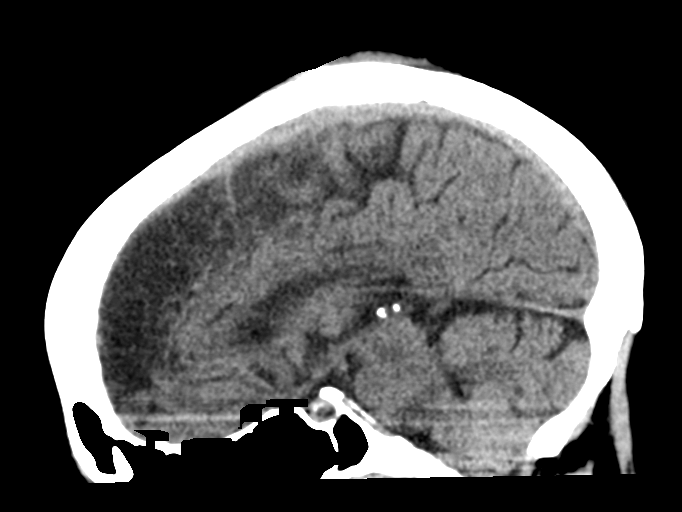
[im 32/48  brain]
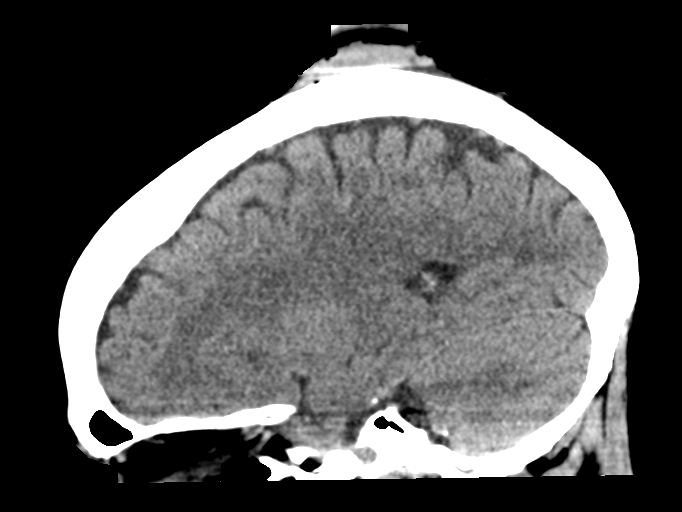

[15 of 46 positions shown; findings below may reference images not displayed]

FINDINGS: Brain: No evidence of acute territorial infarction, hemorrhage,
hydrocephalus,extra-axial collection or mass lesion/mass effect.
There is dilatation the ventricles and sulci consistent with
age-related atrophy. Low-attenuation changes in the deep white
matter consistent with small vessel ischemia.

Vascular: No hyperdense vessel or unexpected calcification.

Skull: The skull is intact. No fracture or focal lesion identified.

Sinuses/Orbits: The visualized paranasal sinuses and mastoid air
cells are clear. The orbits and globes intact.

Other: Soft tissue hematoma with soft tissue swelling seen overlying
the superior skull.

Cervical spine:

Alignment: There is a minimal anterolisthesis of C3 on C4.

Skull base and vertebrae: Visualized skull base is intact. No
atlanto-occipital dissociation. The vertebral body heights are well
maintained. No fracture or pathologic osseous lesion seen.

Soft tissues and spinal canal: The visualized paraspinal soft
tissues are unremarkable. No prevertebral soft tissue swelling is
seen. The spinal canal is grossly unremarkable, no large epidural
collection or significant canal narrowing.

Disc levels: Cervical spine spondylosis is seen with anterior
osteophytes, disc osteophyte complex and uncovertebral osteophytes
most notable at C5-C6 and C6-C7. No significant canal stenosis
however seen.

Upper chest: The lung apices are clear. Thoracic inlet is within
normal limits.

Other: None
IMPRESSION: No acute intracranial abnormality.

Findings consistent with age related atrophy and chronic small
vessel ischemia

Soft tissue hematoma and swelling seen over the superior skull

No acute fracture or malalignment of the spine.

## 2021-02-14 ENCOUNTER — Ambulatory Visit: Payer: BC Managed Care – PPO | Admitting: Occupational Therapy

## 2021-02-20 ENCOUNTER — Ambulatory Visit: Payer: BC Managed Care – PPO | Attending: Surgery | Admitting: Occupational Therapy

## 2021-02-20 DIAGNOSIS — M25532 Pain in left wrist: Secondary | ICD-10-CM | POA: Diagnosis present

## 2021-02-20 DIAGNOSIS — L905 Scar conditions and fibrosis of skin: Secondary | ICD-10-CM | POA: Insufficient documentation

## 2021-02-20 DIAGNOSIS — M6281 Muscle weakness (generalized): Secondary | ICD-10-CM | POA: Insufficient documentation

## 2021-02-20 DIAGNOSIS — M79641 Pain in right hand: Secondary | ICD-10-CM | POA: Insufficient documentation

## 2021-02-20 DIAGNOSIS — M654 Radial styloid tenosynovitis [de Quervain]: Secondary | ICD-10-CM | POA: Diagnosis present

## 2021-02-20 DIAGNOSIS — M25641 Stiffness of right hand, not elsewhere classified: Secondary | ICD-10-CM | POA: Diagnosis present

## 2021-02-20 NOTE — Therapy (Signed)
[facility] [address] [place], [address] Phone: [phone]   Fax:  [phone]  Occupational Therapy Treatment  Patient Details  Name: Gabrielle Malone MRN: [id number] Date of Birth: May 09, 1956 Referring Provider (OT): DR Poggi   Encounter Date: 02/20/2021   OT End of Session - 02/20/21 1930     Visit Number 8    Number of Visits 18    Date for OT Re-Evaluation 03/27/21    OT Start Time 1301    OT Stop Time 1405    OT Time Calculation (min) 64 min    Activity Tolerance Patient tolerated treatment well    Behavior During Therapy [facility] for tasks assessed/performed             Past Medical History:  Diagnosis Date   Anxiety    Complication of anesthesia    Remembers part of surgery from rhinoplasty   Depression    Endometriosis    GERD (gastroesophageal reflux disease)    Insomnia     Past Surgical History:  Procedure Laterality Date   carpal tunnel     CESAREAN SECTION     GASTRIC BYPASS     OOPHORECTOMY     RHINOPLASTY     TRIGGER FINGER RELEASE Right 10/13/2020   Procedure: RIGHT MIDDLE FINGER 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;  Surgeon: Corky Mull, MD;  Location: [facility] ORS;  Service: Orthopedics;  Laterality: Right;    There were no vitals filed for this visit.   Subjective Assessment - 02/20/21 1925     Subjective  I did not use my paraffin for while and did mow the lawn the other day and used both hands -was hurting after that - were not good about scar massage -and R hand more tight and pain in L thumb - wearing brace some but did not for couple of days - had to much to do    Pertinent History Gabrielle Malone is a 65 y.o. female who had on 10/13/20 -  Dupuytren's contracture with release of a right long trigger finger by Dr Roland Rack.  Overall, the patient feels that she is doing reasonably well. She still notes some stiffness/soreness in the long finger and  hand. The soreness is worse in the afternoons. She has been taking naproxen as necessary with temporary partial relief of her symptoms as well as applying heat. She still also notes some "tingling" along the middle finger. She has been performing exercises on her own at home. Refer o OT Halford Decamp threrapy    Patient Stated Goals To be able to get full motion in my hand and middle finger so I can use it normally and pain/tenderness better    Currently in Pain? Yes    Pain Score 8     Pain Location Wrist    Pain Orientation Left    Pain Descriptors / Indicators Aching;Tender    Pain Type Acute pain    Pain Onset More than a month ago    Pain Frequency Constant    Aggravating Factors  use of thumb and moving wrist on L                [facility] OT Assessment - 02/20/21 0001       AROM   Left Wrist Extension 50 Degrees   pain 2/10   Left Wrist Flexion 90 Degrees   pain 2/10   Left Wrist Radial Deviation 20 Degrees  Left Wrist Ulnar Deviation 30 Degrees      Strength   Right Hand Grip (lbs) 37    Right Hand Lateral Pinch 13 lbs    Right Hand 3 Point Pinch 9 lbs    Left Hand Grip (lbs) 47    Left Hand Lateral Pinch 13 lbs   pain 3/10   Left Hand 3 Point Pinch 9 lbs   pain 3/10     Right Hand AROM   R Long PIP 0-100 95 Degrees   -25   R Long DIP 0-70 85 Degrees              Tenderness over L distal radius and positive Finkelstein 8/10 pain  In thumb spica prefab but not wearing it at times       Pt ed on use of ionto - skin check done prior and afterwards - tolerated well    OT Treatments/Exercises (OP) - 02/20/21 0001       Iontophoresis   Type of Iontophoresis Dexamethasone    Location L 1st dorsal compartment    Dose med patch on 2.0 current ,    Time 19      RUE Paraffin   Number Minutes Paraffin 8 Minutes    RUE Paraffin Location Hand    Comments prior to scar massage and ROM      LUE Contrast Bath   Time 8 minutes    Comments prior to soft tissue to L  wrist and foraarm            Scar massage done using mini massager on palm - prior  to ROM Scar massage manual by OT And pt did get  mini massager - reinforce to cont with and her use of paraffin -  She mowed the lawn and did not do her HEP for few days - increase pain bilateral hands -increase stiffness in 3rd digit on L hand     With extention  stretch to 3rd digit cica scar pad for night time issued again   Rolling over red foam roller for massage and extention of digist 20 reps pain free  And then review to cont with table slides for composite extention stretch - 20 reps    Blocked tendon glides-  composite fist  with pen in palm to block MC at 90 and then PIP at 95 Opposition  to all digits   2-3  x day Enlarge her grip at the moment when gripping objects - hold of on squeezing object  Pt to start with larger markers or pencils with coloring or doodling and then go smaller and precision  - working gradually up to her drawing again   Prefab thumb spica on L hand , contrast 2 x day  And done some graston tool nr 2 for sweeping to volar, dorsal and radial forearm - tightness on rradial side         OT Education - 02/20/21 1929     Education Details changes to HEP and progress    Person(s) Educated Patient    Methods Explanation;Demonstration;Tactile cues;Verbal cues;Handout    Comprehension Verbal cues required;Returned demonstration;Verbalized understanding              OT Short Term Goals - 01/12/21 1356       OT SHORT TERM GOAL #1   Title Pt to be independeint in HEP to decrease scar tissue, edema and pain to touch palm with 3rd digit    Baseline pain 3/10  and scar tissue thick and tender - Flexion of PIP 70 , ext -25 at [facility] at 3rd digit  NOW MC 90,PIP 95 , extnetion 0    Status Achieved               OT Long Term Goals - 02/20/21 1937       OT LONG TERM GOAL #1   Title Pt to make composite fist with R hand touching palm without increase symptoms     Baseline pain and scar tissue 3/10 - and tender - flexion of 3rdPIP 70 - edema 0.7 at PIP  NOW - stiff in 3rd digit- not touching palm coming in    Time 5    Period Weeks    Status On-going    Target Date 03/27/21      OT LONG TERM GOAL #2   Title R grip and prehension strength increase to WNL compare to L to carry groceries, kayak and use tools without symptoms    Baseline GRip R 34, L 46 ; lat and 3 point decrease by 2 lbs compare to L - pt is R hand dominant - tender and thick scar tissue - symptoms increase 3/10  NOW no pain just tight and grip increase 37 lbs R, 13 and 9 lbs    Time 5    Period Weeks    Status On-going    Target Date 03/27/21      OT LONG TERM GOAL #3   Title 3rd digit extention increase to WNL to put hand in pocket and no issue typing on computer    Baseline scar tissue tender and pain in palm 3/10 - MC extention of 3rd -25 NOW this date lost some extention -mowed the lawn and did not do HEP , scar massage and paraffin    Time 5    Period Weeks    Status On-going    Target Date 03/27/21      OT LONG TERM GOAL #4   Title L wrist pain decrease to less than 2/10 with AROM in all planes and use in ADL's to wean out of splint    Baseline splint on - tenderness and Finkelstein 8/10 - AROM and prehension 3/10 pain    Time 5    Period Weeks    Status New    Target Date 03/27/21                   Plan - 02/20/21 1930     Clinical Impression Statement Pt close to 4 months  s/p R 3rd digit trigger finger and dupuytrens release- with reports of DeQuervain on the L - Dr Roland Rack send order for ionto and tx of L wrist /thumb- this date pt show increase tightness in flexion and extention of 3rd digit on R hand . Pt report did not do HEP as she should have or use paraffin. On L pt tender 8/10 over distal radius and positive Finkelstein - pain with wrist AROM in all planes and prehension strength. Pt's prefab brace adjusted and pt ed modifications , ionto use and  splint wearing on L hand and wrist. Reinforced again with pt use of paraffin to L hand , scar mobs and ROM for digits extention and flexoin - until follow up appt with surgeon. Pt can cont to benefit from cont OT services to decrease scar tissue and stiffness in R hand . Decrease pain and increase AROM / use of R hand and wrist in painfree  range    OT Occupational Profile and History Problem Focused Assessment - Including review of records relating to presenting problem    Occupational performance deficits (Please refer to evaluation for details): ADL's;IADL's;Work;Play;Leisure;Social Participation    Body Structure / Function / Physical Skills ADL;Strength;Pain;Edema;UE functional use;ROM;Scar mobility;Flexibility;FMC;Decreased knowledge of precautions    Rehab Potential Good    Clinical Decision Making Limited treatment options, no task modification necessary    Comorbidities Affecting Occupational Performance: None    Modification or Assistance to Complete Evaluation  No modification of tasks or assist necessary to complete eval    OT Frequency 2x / week    OT Duration --   5 wks   OT Treatment/Interventions Self-care/ADL training;Moist Heat;Splinting;Manual Therapy;Paraffin;Passive range of motion;Scar mobilization;Therapeutic exercise;Contrast Bath    Consulted and Agree with Plan of Care Patient             Patient will benefit from skilled therapeutic intervention in order to improve the following deficits and impairments:   Body Structure / Function / Physical Skills: ADL, Strength, Pain, Edema, UE functional use, ROM, Scar mobility, Flexibility, FMC, Decreased knowledge of precautions       Visit Diagnosis: Scar condition and fibrosis of skin - Plan: Ot plan of care cert/re-cert  Muscle weakness (generalized) - Plan: Ot plan of care cert/re-cert  Stiffness of right hand, not elsewhere classified - Plan: Ot plan of care cert/re-cert  Pain in right hand - Plan: Ot plan of care  cert/re-cert  Radial styloid tenosynovitis - Plan: Ot plan of care cert/re-cert  Pain in left wrist - Plan: Ot plan of care cert/re-cert    Problem List Patient Active Problem List   Diagnosis Date Noted   Anxiety 04/23/2017   Depression 04/23/2017   Gestational diabetes 04/23/2017   Endometriosis 04/23/2017   Menstrual problem 04/23/2017   Status post normal childbirth 04/23/2017   Physical exam, organ donor 02/05/2013   Pre-op evaluation 02/05/2013   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    Gabrielle Malone,CLT 02/20/2021, 7:51 PM  [facility] [address], [place], [address] Phone: [phone]   Fax:  [phone]  Name: Shantell Belongia MRN: [id number] Date of Birth: 04/13/1956

## 2021-02-23 ENCOUNTER — Ambulatory Visit: Payer: BC Managed Care – PPO | Admitting: Occupational Therapy

## 2021-02-23 ENCOUNTER — Encounter: Payer: Self-pay | Admitting: Occupational Therapy

## 2021-02-23 DIAGNOSIS — L905 Scar conditions and fibrosis of skin: Secondary | ICD-10-CM | POA: Diagnosis not present

## 2021-02-23 DIAGNOSIS — M25532 Pain in left wrist: Secondary | ICD-10-CM

## 2021-02-23 DIAGNOSIS — M79641 Pain in right hand: Secondary | ICD-10-CM

## 2021-02-23 DIAGNOSIS — M25641 Stiffness of right hand, not elsewhere classified: Secondary | ICD-10-CM

## 2021-02-23 DIAGNOSIS — M6281 Muscle weakness (generalized): Secondary | ICD-10-CM

## 2021-02-23 DIAGNOSIS — M654 Radial styloid tenosynovitis [de Quervain]: Secondary | ICD-10-CM

## 2021-02-27 ENCOUNTER — Ambulatory Visit: Payer: BC Managed Care – PPO | Admitting: Occupational Therapy

## 2021-02-27 NOTE — Therapy (Signed)
[facility] [address], [place], [address] Phone: [phone]   Fax:  [phone]  Occupational Therapy Treatment  Patient Details  Name: Gabrielle Malone MRN: [id number] Date of Birth: 02/01/1956 Referring Provider (OT): DR Poggi   Encounter Date: 02/23/2021   OT End of Session - 02/26/21 1056     Visit Number 9    Number of Visits 18    Date for OT Re-Evaluation 03/27/21    OT Start Time 1315    OT Stop Time 1400    OT Time Calculation (min) 45 min    Activity Tolerance Patient tolerated treatment well    Behavior During Therapy [facility] for tasks assessed/performed             Past Medical History:  Diagnosis Date   Anxiety    Complication of anesthesia    Remembers part of surgery from rhinoplasty   Depression    Endometriosis    GERD (gastroesophageal reflux disease)    Insomnia     Past Surgical History:  Procedure Laterality Date   carpal tunnel     CESAREAN SECTION     GASTRIC BYPASS     OOPHORECTOMY     RHINOPLASTY     TRIGGER FINGER RELEASE Right 10/13/2020   Procedure: RIGHT MIDDLE FINGER 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;  Surgeon: Corky Mull, MD;  Location: [facility] ORS;  Service: Orthopedics;  Laterality: Right;    There were no vitals filed for this visit.   Subjective Assessment - 02/26/21 1054     Subjective  Pt reports she had a little bit of clicking in the right ring finger the other day and is concerned about the ring finger becoming a trigger finger.    Pertinent History Gabrielle Malone who had on 10/13/20 -  Dupuytren's contracture with release of a right long trigger finger by Dr Roland Rack.  Overall, the patient feels that she is doing reasonably well. She still notes some stiffness/soreness in the long finger and hand. The soreness is worse in the afternoons. She has been taking naproxen as necessary with temporary partial  relief of her symptoms as well as applying heat. She still also notes some "tingling" along the middle finger. She has been performing exercises on her own at home. Refer o OT Halford Decamp threrapy    Patient Stated Goals To be able to get full motion in my hand and middle finger so I can use it normally and pain/tenderness better    Currently in Pain? Yes    Pain Score 4     Pain Location Hand    Pain Orientation Right;Left    Pain Descriptors / Indicators Aching    Pain Type Acute pain    Pain Onset More than a month ago    Pain Frequency Constant                [facility] OT Assessment - 02/26/21 1059       AROM   Left Wrist Extension 50 Degrees   pain 2/10   Left Wrist Flexion 90 Degrees   pain 2/10   Left Wrist Radial Deviation 20 Degrees    Left Wrist Ulnar Deviation 30 Degrees      Strength   Right Hand Grip (lbs) 37    Left Hand Grip (lbs) 47  OT Treatments/Exercises (OP) - 02/27/21 1100       Iontophoresis   Type of Iontophoresis Dexamethasone    Location L 1st dorsal compartment    Dose med patch on 2.0 current ,    Time 19      RUE Paraffin   Number Minutes Paraffin 10 Minutes    RUE Paraffin Location Hand    Comments prior to scar massage and ROM      LUE Contrast Bath   Time 11 minutes    Comments prior to soft tissue to L wrist and forearm            Pt remains tender over left distal radius and positive finkelstein but pain reduced to 4/10 today.  Reports wearing her thumb spica prefab consistently.    Manual therapy:  Scar massage to right palm, use of mini massager on palm to decrease adhesions and increase tissue mobility.  Graston tool nr 2 for use on volar, dorsal and radial forearm.  Therapeutic Exercises: Tendon gliding exercises with blocked MCP at 90 degrees Opposition to all digits Wrist extension stretch Tabletop slides for extension stretch  All exercises performed with verbal cues for proper form and  technique.   Discussed use of built up tools/utensils to continue to enlarge grip when managing objects and avoid tight squeezing.  Discussed her gripping steering wheel to tightly and to avoid over gripping when driving and stretching hand when at a stoplight or when she is safely able.   Pt to continue with use of thumb spica, contrast for edema at home.   Skin inspected prior to ionto tx, pt to wear patch for another hour after leaving the clinic and then remove.    Response to tx: Pt reports some "clicking" in right finger this last week and has some concerns she is developing another trigger finger, she could not recall what she was doing at the time, she was not tender over the A1 pulley this date.  Will monitor and adjust as needed.  Discussed the need to be cognizant of her gripping patterns and not to perform tasks with overgripping, use built up handles, stretching and performing scar massage and contrast.  Pt tolerating ionto well, left patch on after session and recommend removing after one hour.  Continue to work towards goals in plan of care to maximize safety and independence in necessary daily tasks.          OT Education - 02/26/21 1056     Education Details HEP    Person(s) Educated Patient    Methods Explanation;Demonstration;Tactile cues;Verbal cues;Handout    Comprehension Verbal cues required;Returned demonstration;Verbalized understanding              OT Short Term Goals - 01/12/21 1356       OT SHORT TERM GOAL #1   Title Pt to be independeint in HEP to decrease scar tissue, edema and pain to touch palm with 3rd digit    Baseline pain 3/10 and scar tissue thick and tender - Flexion of PIP 70 , ext -25 at [facility] at 3rd digit  NOW MC 90,PIP 95 , extnetion 0    Status Achieved               OT Long Term Goals - 02/20/21 1937       OT LONG TERM GOAL #1   Title Pt to make composite fist with R hand touching palm without increase symptoms    Baseline pain  and  scar tissue 3/10 - and tender - flexion of 3rdPIP 70 - edema 0.7 at PIP  NOW - stiff in 3rd digit- not touching palm coming in    Time 5    Period Weeks    Status On-going    Target Date 03/27/21      OT LONG TERM GOAL #2   Title R grip and prehension strength increase to WNL compare to L to carry groceries, kayak and use tools without symptoms    Baseline GRip R 34, L 46 ; lat and 3 point decrease by 2 lbs compare to L - pt is R hand dominant - tender and thick scar tissue - symptoms increase 3/10  NOW no pain just tight and grip increase 37 lbs R, 13 and 9 lbs    Time 5    Period Weeks    Status On-going    Target Date 03/27/21      OT LONG TERM GOAL #3   Title 3rd digit extention increase to WNL to put hand in pocket and no issue typing on computer    Baseline scar tissue tender and pain in palm 3/10 - MC extention of 3rd -25 NOW this date lost some extention -mowed the lawn and did not do HEP , scar massage and paraffin    Time 5    Period Weeks    Status On-going    Target Date 03/27/21      OT LONG TERM GOAL #4   Title L wrist pain decrease to less than 2/10 with AROM in all planes and use in ADL's to wean out of splint    Baseline splint on - tenderness and Finkelstein 8/10 - AROM and prehension 3/10 pain    Time 5    Period Weeks    Status New    Target Date 03/27/21                   Plan - 02/27/21 1057     Clinical Impression Statement Pt is 4 months  s/p R 3rd digit trigger finger and dupuytrens release- with reports of DeQuervain on the left.  Pt reports some "clicking" in right finger this last week and has some concerns she is developing another trigger finger, she could not recall what she was doing at the time, she was not tender over the A1 pulley this date.  Will monitor and adjust as needed.  Discussed the need to be cognizant of her gripping patterns and not to perform tasks with overgripping, use built up handles, stretching and performing scar  massage and contrast.  Pt tolerating ionto well, left patch on after session and recommend removing after one hour.  Continue to work towards goals in plan of care to maximize safety and independence in necessary daily tasks.    OT Occupational Profile and History Problem Focused Assessment - Including review of records relating to presenting problem    Occupational performance deficits (Please refer to evaluation for details): ADL's;IADL's;Work;Play;Leisure;Social Participation    Body Structure / Function / Physical Skills ADL;Strength;Pain;Edema;UE functional use;ROM;Scar mobility;Flexibility;FMC;Decreased knowledge of precautions    Rehab Potential Good    Clinical Decision Making Limited treatment options, no task modification necessary    Comorbidities Affecting Occupational Performance: None    Modification or Assistance to Complete Evaluation  No modification of tasks or assist necessary to complete eval    OT Frequency 2x / week    OT Duration --   5 wks   OT  Treatment/Interventions Self-care/ADL training;Moist Heat;Splinting;Manual Therapy;Paraffin;Passive range of motion;Scar mobilization;Therapeutic exercise;Contrast Bath    Consulted and Agree with Plan of Care Patient             Patient will benefit from skilled therapeutic intervention in order to improve the following deficits and impairments:   Body Structure / Function / Physical Skills: ADL, Strength, Pain, Edema, UE functional use, ROM, Scar mobility, Flexibility, FMC, Decreased knowledge of precautions       Visit Diagnosis: Scar condition and fibrosis of skin  Muscle weakness (generalized)  Stiffness of right hand, not elsewhere classified  Pain in right hand  Radial styloid tenosynovitis  Pain in left wrist    Problem List Patient Active Problem List   Diagnosis Date Noted   Anxiety 04/23/2017   Depression 04/23/2017   Gestational diabetes 04/23/2017   Endometriosis 04/23/2017   Menstrual problem  04/23/2017   Status post normal childbirth 04/23/2017   Physical exam, organ donor 02/05/2013   Pre-op evaluation 02/05/2013   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   Gabrielle Malone, OTR/L, CLT  Gabrielle Malone 02/27/2021, 11:25 AM  [facility] [address] [place], [address] Phone: [phone]   Fax:  [phone]  Name: Gabrielle Malone MRN: [id number] Date of Birth: [birth year]

## 2021-03-02 ENCOUNTER — Ambulatory Visit: Payer: BC Managed Care – PPO | Admitting: Occupational Therapy

## 2021-03-02 DIAGNOSIS — M79641 Pain in right hand: Secondary | ICD-10-CM

## 2021-03-02 DIAGNOSIS — L905 Scar conditions and fibrosis of skin: Secondary | ICD-10-CM | POA: Diagnosis not present

## 2021-03-02 DIAGNOSIS — M25532 Pain in left wrist: Secondary | ICD-10-CM

## 2021-03-02 DIAGNOSIS — M654 Radial styloid tenosynovitis [de Quervain]: Secondary | ICD-10-CM

## 2021-03-02 DIAGNOSIS — M6281 Muscle weakness (generalized): Secondary | ICD-10-CM

## 2021-03-02 DIAGNOSIS — M25641 Stiffness of right hand, not elsewhere classified: Secondary | ICD-10-CM

## 2021-03-02 NOTE — Therapy (Signed)
[facility] [address], [place], [address] Phone: [phone]   Fax:  [phone]  Occupational Therapy Treatment  Patient Details  Name: Gabrielle Malone MRN: [id number] Date of Birth: 1956-02-10 Referring Provider (OT): DR Roland Rack   Encounter Date: 03/02/2021   OT End of Session - 03/02/21 1313     Visit Number 10    Number of Visits 18    Date for OT Re-Evaluation 03/27/21    OT Start Time 4287    OT Stop Time 1330    OT Time Calculation (min) 52 min    Activity Tolerance Patient tolerated treatment well    Behavior During Therapy [facility] for tasks assessed/performed             Past Medical History:  Diagnosis Date   Anxiety    Complication of anesthesia    Remembers part of surgery from rhinoplasty   Depression    Endometriosis    GERD (gastroesophageal reflux disease)    Insomnia     Past Surgical History:  Procedure Laterality Date   carpal tunnel     CESAREAN SECTION     GASTRIC BYPASS     OOPHORECTOMY     RHINOPLASTY     TRIGGER FINGER RELEASE Right 10/13/2020   Procedure: RIGHT MIDDLE FINGER 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;  Surgeon: Corky Mull, MD;  Location: [facility] ORS;  Service: Orthopedics;  Laterality: Right;    There were no vitals filed for this visit.   Subjective Assessment - 03/02/21 1311     Subjective  My husband fell and had hip surgery Monday - and then I lost my splint for my L thumb  -and my R ring finger is now locking when driving    Pertinent History Gabrielle Malone is a 65 y.o. female who had on 10/13/20 -  Dupuytren's contracture with release of a right long trigger finger by Dr Roland Rack.  Overall, the patient feels that she is doing reasonably well. She still notes some stiffness/soreness in the long finger and hand. The soreness is worse in the afternoons. She has been taking naproxen as necessary with temporary partial relief of her  symptoms as well as applying heat. She still also notes some "tingling" along the middle finger. She has been performing exercises on her own at home. Refer o OT Halford Decamp threrapy    Patient Stated Goals To be able to get full motion in my hand and middle finger so I can use it normally and pain/tenderness better    Currently in Pain? Yes    Pain Score 6     Pain Location Wrist    Pain Orientation Left    Pain Descriptors / Indicators Aching;Tender    Pain Type Acute pain    Pain Onset More than a month ago             Pt cont to be limited in R 3rd digit composite extention and flexion - scar tissue thick  Pt report triggering of 4th on the L  Tender over A1pulley of 4th  Pt to discuss L hand with Dr Roland Rack Pt report she lost her thumb spica splint  And pain still same in L thumb  Pain and tenderness over 1st dorsal compartment 6-7/10 , positive Finkelstein  Decrease wrist ext 55, flexion 90  UD and RD WNL but pain end range  Pain with thumb RA  skin check done prior - no issues and tolerated well - pt to keep on for hour    OT Treatments/Exercises (OP) - 03/02/21 0001       Iontophoresis   Type of Iontophoresis Dexamethasone    Location L 1st dorsal compartment    Dose med patch on 2.0 current ,    Time 19      LUE Contrast Bath   Time 8 minutes    Comments prior to soft tissue - graston tool              Prefab thumb spica on L hand  fitted again , contrast 2 x day  to bedone  And done some graston tool nr 2 for sweeping to volar, dorsal and radial forearm - tightness on rradial side       OT Education - 03/02/21 1313     Education Details HEP    Person(s) Educated Patient    Methods Explanation;Demonstration;Tactile cues;Verbal cues;Handout    Comprehension Verbal cues required;Returned demonstration;Verbalized understanding              OT Short Term Goals - 01/12/21 1356       OT SHORT TERM GOAL #1   Title Pt to be  independeint in HEP to decrease scar tissue, edema and pain to touch palm with 3rd digit    Baseline pain 3/10 and scar tissue thick and tender - Flexion of PIP 70 , ext -25 at [facility] at 3rd digit  NOW MC 90,PIP 95 , extnetion 0    Status Achieved               OT Long Term Goals - 02/20/21 1937       OT LONG TERM GOAL #1   Title Pt to make composite fist with R hand touching palm without increase symptoms    Baseline pain and scar tissue 3/10 - and tender - flexion of 3rdPIP 70 - edema 0.7 at PIP  NOW - stiff in 3rd digit- not touching palm coming in    Time 5    Period Weeks    Status On-going    Target Date 03/27/21      OT LONG TERM GOAL #2   Title R grip and prehension strength increase to WNL compare to L to carry groceries, kayak and use tools without symptoms    Baseline GRip R 34, L 46 ; lat and 3 point decrease by 2 lbs compare to L - pt is R hand dominant - tender and thick scar tissue - symptoms increase 3/10  NOW no pain just tight and grip increase 37 lbs R, 13 and 9 lbs    Time 5    Period Weeks    Status On-going    Target Date 03/27/21      OT LONG TERM GOAL #3   Title 3rd digit extention increase to WNL to put hand in pocket and no issue typing on computer    Baseline scar tissue tender and pain in palm 3/10 - MC extention of 3rd -25 NOW this date lost some extention -mowed the lawn and did not do HEP , scar massage and paraffin    Time 5    Period Weeks    Status On-going    Target Date 03/27/21      OT LONG TERM GOAL #4   Title L wrist pain decrease to less than 2/10 with AROM in all planes and use in ADL's to wean out  of splint    Baseline splint on - tenderness and Finkelstein 8/10 - AROM and prehension 3/10 pain    Time 5    Period Weeks    Status New    Target Date 03/27/21                   Plan - 03/02/21 1314     Clinical Impression Statement Pt is 4 months  s/p R 3rd digit trigger finger and dupuytrens release- with reports of  DeQuervain on the left.  Pt reports some "clicking" in right 4th digit now - and cont to have scar tissue limiting her composite flexion and extention in 3rd   Reinforce again pt to be cognizant of her gripping patterns and not to perform tasks with overgripping, use built up handles, stretching and performing scar massage and contrast. Pt only had 3 session of ionto and then her husband fracture his hip and she missed earlier this week - She also lost her thumb spica since the past weekend - cont to be tender over L 1st dorsalcompartment, positive Finkelstein 6-7/10 , pain with AROM UD ,RD and wrist ext - as well as thumb RA - pt to see Dr Roland Rack Monday and discuss concerns about R hand , and possble shot for L thumb - pt to wear thumb spica afterwards for week or 2.    OT Occupational Profile and History Problem Focused Assessment - Including review of records relating to presenting problem    Occupational performance deficits (Please refer to evaluation for details): ADL's;IADL's;Work;Play;Leisure;Social Participation    Body Structure / Function / Physical Skills ADL;Strength;Pain;Edema;UE functional use;ROM;Scar mobility;Flexibility;FMC;Decreased knowledge of precautions    Rehab Potential Good    Clinical Decision Making Limited treatment options, no task modification necessary    Comorbidities Affecting Occupational Performance: None    Modification or Assistance to Complete Evaluation  No modification of tasks or assist necessary to complete eval    OT Frequency 2x / week    OT Duration 4 weeks    OT Treatment/Interventions Self-care/ADL training;Moist Heat;Splinting;Manual Therapy;Paraffin;Passive range of motion;Scar mobilization;Therapeutic exercise;Contrast Bath    Consulted and Agree with Plan of Care Patient             Patient will benefit from skilled therapeutic intervention in order to improve the following deficits and impairments:   Body Structure / Function / Physical Skills:  ADL, Strength, Pain, Edema, UE functional use, ROM, Scar mobility, Flexibility, FMC, Decreased knowledge of precautions       Visit Diagnosis: Muscle weakness (generalized)  Scar condition and fibrosis of skin  Stiffness of right hand, not elsewhere classified  Pain in right hand  Radial styloid tenosynovitis  Pain in left wrist    Problem List Patient Active Problem List   Diagnosis Date Noted   Anxiety 04/23/2017   Depression 04/23/2017   Gestational diabetes 04/23/2017   Endometriosis 04/23/2017   Menstrual problem 04/23/2017   Status post normal childbirth 04/23/2017   Physical exam, organ donor 02/05/2013   Pre-op evaluation 02/05/2013   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    Corneilus Heggie OTR/L,CLT 03/02/2021, 1:20 PM  [facility] [address], [place], [address] Phone: [phone]   Fax:  [phone]  Name: Gabrielle Malone MRN: [id number] Date of Birth: 1956-01-21

## 2021-03-08 ENCOUNTER — Other Ambulatory Visit: Payer: Self-pay | Admitting: Surgery

## 2021-03-16 ENCOUNTER — Other Ambulatory Visit: Payer: Self-pay

## 2021-03-16 ENCOUNTER — Encounter
Admission: RE | Admit: 2021-03-16 | Discharge: 2021-03-16 | Disposition: A | Discharge status: Home / Self Care | Payer: BC Managed Care – PPO | Source: Ambulatory Visit | Attending: Surgery | Admitting: Surgery

## 2021-03-16 HISTORY — DX: Gestational diabetes mellitus in pregnancy, unspecified control: O24.419

## 2021-03-16 NOTE — Patient Instructions (Addendum)
Your procedure is scheduled on:03-28-21 Tuesday Report to the Registration Desk on the 1st floor of the [facility].Then proceed to the 2nd floor surgery desk in the [facility] To find out your arrival time, please call [phone] between 1PM - 3PM on:03-27-21 Monday  REMEMBER: Instructions that are not followed completely may result in serious medical risk, up to and including death; or upon the discretion of your surgeon and anesthesiologist your surgery may need to be rescheduled.  Do not eat food after midnight the night before surgery.  No gum chewing, lozengers or hard candies.  You may however, drink CLEAR liquids up to 2 hours before you are scheduled to arrive for your surgery. Do not drink anything within 2 hours of your scheduled arrival time.  Clear liquids include: - water  - apple juice without pulp - gatorade (not RED, PURPLE, OR BLUE) - black coffee or tea (Do NOT add milk or creamers to the coffee or tea) Do NOT drink anything that is not on this list.  In addition, your doctor has ordered for you to drink the provided  Ensure Pre-Surgery Clear Carbohydrate Drink  Drinking this carbohydrate drink up to two hours before surgery helps to reduce insulin resistance and improve patient outcomes. Please complete drinking 2 hours prior to scheduled arrival time.  TAKE THESE MEDICATIONS THE MORNING OF SURGERY WITH A SIP OF WATER: -Propranolol (Inderal) -Sertraline (Zoloft) -Prilosec (Omeprazole)-take one the night before and one on the morning of surgery - helps to prevent nausea after surgery.)  One week prior to surgery: Stop Anti-inflammatories (NSAIDS) such as Advil, Aleve, Ibuprofen, Motrin, Naproxen, Naprosyn and Aspirin based products such as Excedrin, Goodys Powder, BC Powder.You may however, continue to take Tylenol if needed for pain up until the day of surgery.  Stop ANY OVER THE COUNTER supplements/vitamins 7 days prior to surgery  No Alcohol for 24 hours  before or after surgery.  No Smoking including e-cigarettes for 24 hours prior to surgery.  No chewable tobacco products for at least 6 hours prior to surgery.  No nicotine patches on the day of surgery.  Do not use any "recreational" drugs for at least a week prior to your surgery.  Please be advised that the combination of cocaine and anesthesia may have negative outcomes, up to and including death. If you test positive for cocaine, your surgery will be cancelled.  On the morning of surgery brush your teeth with toothpaste and water, you may rinse your mouth with mouthwash if you wish. Do not swallow any toothpaste or mouthwash.  Do not wear jewelry, make-up, hairpins, clips or nail polish.  Do not wear lotions, powders, or perfumes.   Do not shave body from the neck down 48 hours prior to surgery just in case you cut yourself which could leave a site for infection.  Also, freshly shaved skin may become irritated if using the CHG soap.  Contact lenses, hearing aids and dentures may not be worn into surgery.  Do not bring valuables to the hospital. [facility] is not responsible for any missing/lost belongings or valuables.   Use CHG Soap as directed on instruction sheet.  Notify your doctor if there is any change in your medical condition (cold, fever, infection).  Wear comfortable clothing (specific to your surgery type) to the hospital.  After surgery, you can help prevent lung complications by doing breathing exercises.  Take deep breaths and cough every 1-2 hours. Your doctor may order a device called an  Incentive Spirometer to help you take deep breaths. When coughing or sneezing, hold a pillow firmly against your incision with both hands. This is called "splinting." Doing this helps protect your incision. It also decreases belly discomfort.  If you are being admitted to the hospital overnight, leave your suitcase in the car. After surgery it may be brought to your  room.  If you are being discharged the day of surgery, you will not be allowed to drive home. You will need a responsible adult (18 years or older) to drive you home and stay with you that night.   If you are taking public transportation, you will need to have a responsible adult (18 years or older) with you. Please confirm with your physician that it is acceptable to use public transportation.   Please call the [facility] at [phone] if you have any questions about these instructions.  Surgery Visitation Policy:  Patients undergoing a surgery or procedure may have one family member or support person with them as long as that person is not COVID-19 positive or experiencing its symptoms.  That person may remain in the waiting area during the procedure.  Inpatient Visitation:    Visiting hours are 7 a.m. to 8 p.m. Inpatients will be allowed two visitors daily. The visitors may change each day during the patient's stay. No visitors under the age of 58. Any visitor under the age of 62 must be accompanied by an adult. The visitor must pass COVID-19 screenings, use hand sanitizer when entering and exiting the patient's room and wear a mask at all times, including in the patient's room. Patients must also wear a mask when staff or their visitor are in the room. Masking is required regardless of vaccination status.

## 2021-03-28 ENCOUNTER — Other Ambulatory Visit: Payer: Self-pay

## 2021-03-28 ENCOUNTER — Ambulatory Visit
Admission: RE | Admit: 2021-03-28 | Discharge: 2021-03-28 | Disposition: A | Discharge status: Home / Self Care | Payer: BC Managed Care – PPO | Attending: Surgery | Admitting: Surgery

## 2021-03-28 ENCOUNTER — Ambulatory Visit: Payer: BC Managed Care – PPO | Admitting: Registered Nurse

## 2021-03-28 ENCOUNTER — Encounter: Payer: Self-pay | Admitting: Surgery

## 2021-03-28 ENCOUNTER — Encounter
Admission: RE | Disposition: A | Discharge status: Home / Self Care | Payer: Self-pay | Source: Home / Self Care | Attending: Surgery

## 2021-03-28 DIAGNOSIS — M720 Palmar fascial fibromatosis [Dupuytren]: Secondary | ICD-10-CM | POA: Insufficient documentation

## 2021-03-28 DIAGNOSIS — M65341 Trigger finger, right ring finger: Secondary | ICD-10-CM | POA: Insufficient documentation

## 2021-03-28 DIAGNOSIS — Z79899 Other long term (current) drug therapy: Secondary | ICD-10-CM | POA: Diagnosis not present

## 2021-03-28 HISTORY — PX: DUPUYTREN CONTRACTURE RELEASE: SHX1478

## 2021-03-28 SURGERY — RELEASE, DUPUYTREN CONTRACTURE
Anesthesia: General | Site: Hand | Laterality: Right

## 2021-03-28 MED ORDER — LACTATED RINGERS IV SOLN
INTRAVENOUS | Status: DC
Start: 2021-03-28 — End: 2021-03-28

## 2021-03-28 MED ORDER — TRAMADOL HCL 50 MG PO TABS: 50.0000 mg | ORAL_TABLET | Freq: Four times a day (QID) | ORAL | Status: DC | PRN

## 2021-03-28 MED ORDER — GLYCOPYRROLATE 0.2 MG/ML IJ SOLN
INTRAMUSCULAR | Status: AC
  Filled 2021-03-28: qty 1

## 2021-03-28 MED ORDER — MIDAZOLAM HCL 2 MG/2ML IJ SOLN
INTRAMUSCULAR | Status: AC
  Filled 2021-03-28: qty 2

## 2021-03-28 MED ORDER — LIDOCAINE HCL (CARDIAC) PF 100 MG/5ML IV SOSY
PREFILLED_SYRINGE | INTRAVENOUS | Status: DC | PRN
  Administered 2021-03-28: 60 mg via INTRAVENOUS

## 2021-03-28 MED ORDER — EPHEDRINE SULFATE 50 MG/ML IJ SOLN
INTRAMUSCULAR | Status: DC | PRN
  Administered 2021-03-28: 5 mg via INTRAVENOUS

## 2021-03-28 MED ORDER — FENTANYL CITRATE (PF) 100 MCG/2ML IJ SOLN: 25.0000 ug | INTRAMUSCULAR | Status: DC | PRN

## 2021-03-28 MED ORDER — DEXAMETHASONE SODIUM PHOSPHATE 10 MG/ML IJ SOLN
INTRAMUSCULAR | Status: DC | PRN
  Administered 2021-03-28: 10 mg via INTRAVENOUS

## 2021-03-28 MED ORDER — 0.9 % SODIUM CHLORIDE (POUR BTL) OPTIME
TOPICAL | Status: DC | PRN
  Administered 2021-03-28: 500 mL

## 2021-03-28 MED ORDER — OXYCODONE HCL 5 MG/5ML PO SOLN
5.0000 mg | Freq: Once | ORAL | Status: AC | PRN
Start: 2021-03-28 — End: 2021-03-28

## 2021-03-28 MED ORDER — MIDAZOLAM HCL 2 MG/2ML IJ SOLN
INTRAMUSCULAR | Status: DC | PRN
  Administered 2021-03-28: 2 mg via INTRAVENOUS

## 2021-03-28 MED ORDER — SUGAMMADEX SODIUM 500 MG/5ML IV SOLN
INTRAVENOUS | Status: AC
  Filled 2021-03-28: qty 5

## 2021-03-28 MED ORDER — ORAL CARE MOUTH RINSE: 15.0000 mL | Freq: Once | OROMUCOSAL | Status: AC

## 2021-03-28 MED ORDER — DEXAMETHASONE SODIUM PHOSPHATE 10 MG/ML IJ SOLN
INTRAMUSCULAR | Status: AC
  Filled 2021-03-28: qty 1

## 2021-03-28 MED ORDER — ACETAMINOPHEN 10 MG/ML IV SOLN: 1000.0000 mg | Freq: Once | INTRAVENOUS | Status: DC | PRN

## 2021-03-28 MED ORDER — OXYCODONE HCL 5 MG PO TABS
ORAL_TABLET | ORAL | Status: AC
  Filled 2021-03-28: qty 1

## 2021-03-28 MED ORDER — FENTANYL CITRATE (PF) 100 MCG/2ML IJ SOLN
INTRAMUSCULAR | Status: AC
  Filled 2021-03-28: qty 2

## 2021-03-28 MED ORDER — ACETAMINOPHEN 10 MG/ML IV SOLN
INTRAVENOUS | Status: AC
  Filled 2021-03-28: qty 100

## 2021-03-28 MED ORDER — BUPIVACAINE HCL (PF) 0.5 % IJ SOLN
INTRAMUSCULAR | Status: DC | PRN
  Administered 2021-03-28: 10 mL

## 2021-03-28 MED ORDER — ACETAMINOPHEN 10 MG/ML IV SOLN
INTRAVENOUS | Status: DC | PRN
  Administered 2021-03-28: 1000 mg via INTRAVENOUS

## 2021-03-28 MED ORDER — FENTANYL CITRATE (PF) 100 MCG/2ML IJ SOLN
INTRAMUSCULAR | Status: DC | PRN
  Administered 2021-03-28 (×3): 25 ug via INTRAVENOUS

## 2021-03-28 MED ORDER — CEFAZOLIN SODIUM-DEXTROSE 2-4 GM/100ML-% IV SOLN
2.0000 g | INTRAVENOUS | Status: AC
  Administered 2021-03-28: 2 g via INTRAVENOUS

## 2021-03-28 MED ORDER — CHLORHEXIDINE GLUCONATE 0.12 % MT SOLN
OROMUCOSAL | Status: AC
  Filled 2021-03-28: qty 15

## 2021-03-28 MED ORDER — METOCLOPRAMIDE HCL 10 MG PO TABS: 5.0000 mg | ORAL_TABLET | Freq: Three times a day (TID) | ORAL | Status: DC | PRN

## 2021-03-28 MED ORDER — LIDOCAINE HCL (PF) 2 % IJ SOLN
INTRAMUSCULAR | Status: AC
  Filled 2021-03-28: qty 5

## 2021-03-28 MED ORDER — TRAMADOL HCL 50 MG PO TABS: 50.0000 mg | ORAL_TABLET | Freq: Four times a day (QID) | ORAL | 0 refills | Status: AC | PRN

## 2021-03-28 MED ORDER — OXYCODONE HCL 5 MG PO TABS
5.0000 mg | ORAL_TABLET | Freq: Once | ORAL | Status: AC | PRN
  Administered 2021-03-28: 5 mg via ORAL

## 2021-03-28 MED ORDER — METOCLOPRAMIDE HCL 5 MG/ML IJ SOLN: 5.0000 mg | Freq: Three times a day (TID) | INTRAMUSCULAR | Status: DC | PRN

## 2021-03-28 MED ORDER — ONDANSETRON HCL 4 MG/2ML IJ SOLN
INTRAMUSCULAR | Status: DC | PRN
Start: 2021-03-28 — End: 2021-03-28
  Administered 2021-03-28: 4 mg via INTRAVENOUS

## 2021-03-28 MED ORDER — ONDANSETRON HCL 4 MG/2ML IJ SOLN
INTRAMUSCULAR | Status: AC
  Filled 2021-03-28: qty 2

## 2021-03-28 MED ORDER — ONDANSETRON HCL 4 MG/2ML IJ SOLN: 4.0000 mg | Freq: Four times a day (QID) | INTRAMUSCULAR | Status: DC | PRN

## 2021-03-28 MED ORDER — PROPOFOL 10 MG/ML IV BOLUS
INTRAVENOUS | Status: AC
  Filled 2021-03-28: qty 20

## 2021-03-28 MED ORDER — GLYCOPYRROLATE 0.2 MG/ML IJ SOLN
INTRAMUSCULAR | Status: DC | PRN
  Administered 2021-03-28 (×2): .1 mg via INTRAVENOUS

## 2021-03-28 MED ORDER — ONDANSETRON HCL 4 MG/2ML IJ SOLN: 4.0000 mg | Freq: Once | INTRAMUSCULAR | Status: DC | PRN

## 2021-03-28 MED ORDER — CEFAZOLIN SODIUM-DEXTROSE 2-4 GM/100ML-% IV SOLN
INTRAVENOUS | Status: AC
  Filled 2021-03-28: qty 100

## 2021-03-28 MED ORDER — CHLORHEXIDINE GLUCONATE 0.12 % MT SOLN
15.0000 mL | Freq: Once | OROMUCOSAL | Status: AC
Start: 2021-03-28 — End: 2021-03-28
  Administered 2021-03-28: 15 mL via OROMUCOSAL

## 2021-03-28 MED ORDER — BUPIVACAINE HCL (PF) 0.5 % IJ SOLN
INTRAMUSCULAR | Status: AC
  Filled 2021-03-28: qty 30

## 2021-03-28 MED ORDER — ONDANSETRON HCL 4 MG PO TABS: 4.0000 mg | ORAL_TABLET | Freq: Four times a day (QID) | ORAL | Status: DC | PRN

## 2021-03-28 MED ORDER — PROPOFOL 10 MG/ML IV BOLUS
INTRAVENOUS | Status: DC | PRN
  Administered 2021-03-28: 40 mg via INTRAVENOUS
  Administered 2021-03-28: 100 mg via INTRAVENOUS

## 2021-03-28 SURGICAL SUPPLY — 34 items
APL PRP STRL LF DISP 70% ISPRP (MISCELLANEOUS) ×1
BNDG COHESIVE 4X5 TAN ST LF (GAUZE/BANDAGES/DRESSINGS) ×2 IMPLANT
BNDG CONFORM 2 STRL LF (GAUZE/BANDAGES/DRESSINGS) ×2 IMPLANT
BNDG ELASTIC 2X5.8 VLCR STR LF (GAUZE/BANDAGES/DRESSINGS) ×2 IMPLANT
BNDG ELASTIC 3X5.8 VLCR STR LF (GAUZE/BANDAGES/DRESSINGS) ×2 IMPLANT
BNDG ESMARK 4X12 TAN STRL LF (GAUZE/BANDAGES/DRESSINGS) ×2 IMPLANT
CHLORAPREP W/TINT 26 (MISCELLANEOUS) ×2 IMPLANT
CORD BIP STRL DISP 12FT (MISCELLANEOUS) ×2 IMPLANT
CUFF TOURN SGL QUICK 18X4 (TOURNIQUET CUFF) ×1 IMPLANT
DRAPE SURG 17X11 SM STRL (DRAPES) ×2 IMPLANT
ELECT REM PT RETURN 9FT ADLT (ELECTROSURGICAL) ×2
ELECTRODE REM PT RTRN 9FT ADLT (ELECTROSURGICAL) ×1 IMPLANT
FORCEPS JEWEL BIP 4-3/4 STR (INSTRUMENTS) ×2 IMPLANT
GAUZE 4X4 16PLY ~~LOC~~+RFID DBL (SPONGE) ×2 IMPLANT
GAUZE SPONGE 4X4 12PLY STRL (GAUZE/BANDAGES/DRESSINGS) ×2 IMPLANT
GAUZE XEROFORM 1X8 LF (GAUZE/BANDAGES/DRESSINGS) ×2 IMPLANT
GLOVE SURG ENC MOIS LTX SZ8 (GLOVE) ×4 IMPLANT
GLOVE SURG UNDER LTX SZ8 (GLOVE) ×2 IMPLANT
GOWN STRL REUS W/ TWL LRG LVL3 (GOWN DISPOSABLE) ×1 IMPLANT
GOWN STRL REUS W/ TWL XL LVL3 (GOWN DISPOSABLE) ×1 IMPLANT
GOWN STRL REUS W/TWL LRG LVL3 (GOWN DISPOSABLE) ×2
GOWN STRL REUS W/TWL XL LVL3 (GOWN DISPOSABLE) ×2
KIT TURNOVER KIT A (KITS) ×2 IMPLANT
MANIFOLD NEPTUNE II (INSTRUMENTS) ×2 IMPLANT
NS IRRIG 1000ML POUR BTL (IV SOLUTION) ×1 IMPLANT
NS IRRIG 500ML POUR BTL (IV SOLUTION) ×2 IMPLANT
PACK EXTREMITY ARMC (MISCELLANEOUS) ×2 IMPLANT
PAD PREP 24X41 OB/GYN DISP (PERSONAL CARE ITEMS) ×2 IMPLANT
SPONGE GAUZE 2X2 8PLY STRL LF (GAUZE/BANDAGES/DRESSINGS) ×2 IMPLANT
STOCKINETTE IMPERVIOUS 9X36 MD (GAUZE/BANDAGES/DRESSINGS) ×2 IMPLANT
SUT PROLENE 4 0 PS 2 18 (SUTURE) ×3 IMPLANT
SUT VIC AB 3-0 SH 27 (SUTURE) ×2
SUT VIC AB 3-0 SH 27X BRD (SUTURE) ×1 IMPLANT
WATER STERILE IRR 500ML POUR (IV SOLUTION) ×2 IMPLANT

## 2021-03-28 NOTE — H&P (Signed)
History of Present Illness:  Gabrielle Malone is a 65 y.o. female who presents for follow-up now 4.5 months status post excision of a Dupuytren's contracture with release of a right long trigger finger. The patient remains frustrated by her persistent limited motion of her right hand as she is not yet able to make a full fist or to hold smaller objects. She also is begun to notice a painful popping of her right ring finger. She has been attending occupational therapy, although she admits that her attendance in therapy has been rather sporadic due to some personal issues. She has been trying to do some exercises on her own at home. She rates her pain in the right hand at 2/10 and is taking Naprosyn on a daily basis with temporary partial relief. She denies any reinjury to the hand, and denies any fevers or chills.  Current Outpatient Medications:  cetirizine (ZYRTEC) 10 mg capsule Take 1 capsule (10 mg total) by mouth once daily 30 capsule 1   cyanocobalamin (VITAMIN B12) 1,000 mcg/mL injection ADMINISTER 1 ML IN THE MUSCLE EVERY MONTH 3 mL 2   dextroamphetamine-amphetamine (ADDERALL) 10 mg tablet Take 10 mg by mouth every morning   fluticasone propionate (FLONASE) 50 mcg/actuation nasal spray SHAKE LIQUID AND USE 2 SPRAYS IN EACH NOSTRIL EVERY DAY 48 g 0   gabapentin (NEURONTIN) 300 MG capsule Take 300 mg by mouth nightly   ibandronate (BONIVA) 150 mg tablet TAKE 1 TABLET(150 MG) BY MOUTH EVERY 30 DAYS WITH A FULL GLASS OF WATER. DO NOT LIE DOWN FOR THE NEXT 60 MINUTES 3 tablet 3   multivitamin tablet Take 1 tablet by mouth once daily.   naproxen (NAPROSYN) 500 MG tablet Take 1 tablet (500 mg total) by mouth 2 (two) times daily as needed 60 tablet 2   omeprazole (PRILOSEC) 40 MG DR capsule TAKE 1 CAPSULE(40 MG) BY MOUTH EVERY DAY 90 capsule 3   propranoloL (INDERAL) 20 MG tablet TAKE 1 TABLET(20 MG) BY MOUTH TWICE DAILY 60 tablet 11   sertraline (ZOLOFT) 100 MG tablet Take 100 mg by mouth every morning    zolpidem (AMBIEN) 10 mg tablet Take 1 tablet (10 mg total) by mouth nightly 30 tablet 3   Allergies: No Known Allergies  Past Medical History:   Anxiety   Depression   Endometriosis   Gestational diabetes   Menstrual problem, unspecified   Status post normal childbirth 1992 (vaginal)   Past Surgical History:   Excision of Dupuytren's contracture right hand with release right long trigger finger 10/13/2020 (Dr. Roland Rack)   CESAREAN SECTION x1   COLONOSCOPY 11/10/2013 (Dr. Mamie Nick. Oh @ [facility] - Hyperplastic Polyp, PHPolyp: 5 yr rpt per [facility])   COLONOSCOPY 04/25/2009 (Dr. Mamie Nick. 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  Physical Exam: Vitals:  03/06/21 1454  BP: 104/68  Weight: 56.9 kg (125 lb 6.4 oz)  Height: 154.9 cm (5\' 1" )  PainSc: 2  PainLoc: Hand   General/Constitutional: The patient appears to be well-nourished, well-developed, and in no acute distress. Neuro/Psych:  Normal mood and affect, oriented to person, place and time. Eyes: Non-icteric. Pupils are equal, round, and reactive to light, and exhibit synchronous movement. ENT: Unremarkable. Lymphatic: No palpable adenopathy. Respiratory: Lungs clear to auscultation, Normal chest excursion, No wheezes and Non-labored breathing Cardiovascular: Regular rate and rhythm. No murmurs. and No edema, swelling or tenderness, except as noted in detailed exam. Integumentary: No impressive skin lesions present, except as noted in detailed exam. Musculoskeletal: Unremarkable, except as noted in detailed exam.  Right hand exam: On examination, her surgical incision remain well-healed and without evidence for infection. She again demonstrates moderate thickening of the scar tissue in the palmar aspect of her hand along the long finger metacarpal which is minimally tender to firm palpation, but no erythema, ecchymosis, abrasions, or other skin abnormalities are identified. She has more moderate tenderness to palpation over the ring metacarpal head.  She is able to actively flex and extend all digits with painful catching of the right ring finger, consistent with a trigger finger.  However, her long finger motion remains limited as she continues to lack 10 to 15 degrees of MCP extension and is only able to flex her long fingertip to within 2 cm from the proximal palmar crease.  She again is neurovascularly intact to all digits.   X-rays/MRI/Lab data:  AP, lateral, and oblique views of the left foot are obtained. These films demonstrate a minimally displaced midshaft fracture of the left fourth proximal phalanx. Overall alignment of the toe was satisfactory. No other acute or chronic bony abnormalities are identified.  Assessment:  Dupuytren's disease of palm of right hand   Trigger ring finger of right hand   Plan: The treatment options were discussed with the patient. In addition, patient educational materials were  provided regarding the diagnosis and treatment options. Regarding her right hand symptoms, the patient is frustrated by her symptoms and function limitations, and would like to consider more aggressive treatment options. Therefore, I have recommended a surgical procedure, specifically a release of the right ring trigger finger with debridement of the right palmar scar tissue. The procedure was discussed with the patient, as were the potential risks (including bleeding, infection, nerve and/or blood vessel injury, persistent or recurrent pain, persistent or recurrent stiffness of her fingers, weakness of grip, need for further surgery, blood clots, strokes, heart attacks and/or arhythmias, pneumonia, etc.) and benefits. The patient states her understanding and wishes to proceed. All of the patient's questions and concerns were answered. She can call any time with further concerns. She will follow up post-surgery, routine.   H&P reviewed and patient re-examined. No changes.

## 2021-03-28 NOTE — Progress Notes (Signed)
Patient awake/alert x4.  Reviewed over phone discharge instructions with patient's daughter:  Gabrielle Malone, verbalizes understanding. Dr. Roland Rack spoke over phone to daughter and at bedside to patient . Reviewed pain medication, ice and elevation. Instructed to call in am for follow up appt.: Office closed at present.  Patient discharged to home with dressing c/d/I right hand:  +CMS intact Digits warm to touch. Ice in place and elevated, sling in place.

## 2021-03-28 NOTE — Anesthesia Procedure Notes (Signed)
Procedure Name: LMA Insertion Date/Time: 03/28/2021 3:47 PM Performed by: Rona Ravens, CRNA Pre-anesthesia Checklist: Patient identified, Patient being monitored, Timeout performed, Emergency Drugs available and Suction available Patient Re-evaluated:Patient Re-evaluated prior to induction Oxygen Delivery Method: Circle system utilized Preoxygenation: Pre-oxygenation with 100% oxygen Induction Type: IV induction Ventilation: Mask ventilation without difficulty LMA: LMA inserted LMA Size: 3.5 Tube type: Oral Number of attempts: 1 Placement Confirmation: positive ETCO2 and breath sounds checked- equal and bilateral Tube secured with: Tape Dental Injury: Teeth and Oropharynx as per pre-operative assessment

## 2021-03-28 NOTE — Anesthesia Postprocedure Evaluation (Signed)
Anesthesia Post Note  Patient: [name]  Procedure(s) Performed: DUPUYTREN CONTRACTURE RELEASE RIGHT HAND AND RELEASE OF RIGHT RING TRIGGER FINGER (Right: Hand)  Patient location during evaluation: PACU Anesthesia Type: General Level of consciousness: awake and alert Pain management: pain level controlled Vital Signs Assessment: post-procedure vital signs reviewed and stable Respiratory status: spontaneous breathing, nonlabored ventilation, respiratory function stable and patient connected to nasal cannula oxygen Cardiovascular status: blood pressure returned to baseline and stable Postop Assessment: no apparent nausea or vomiting Anesthetic complications: no   No notable events documented.   Last Vitals:  Vitals:   03/28/21 1715 03/28/21 1735  BP: 139/79 135/74  Pulse: 86 80  Resp: 16 16  Temp: (!) 36.4 C (!) 36.3 C  SpO2: 96% 97%    Last Pain:  Vitals:   03/28/21 1735  TempSrc:   PainSc: Gabrielle Malone

## 2021-03-28 NOTE — Transfer of Care (Signed)
Immediate Anesthesia Transfer of Care Note  Patient: Gabrielle Malone  Procedure(s) Performed: DUPUYTREN CONTRACTURE RELEASE RIGHT HAND AND RELEASE OF RIGHT RING TRIGGER FINGER (Right: Hand)  Patient Location: PACU  Anesthesia Type:General  Level of Consciousness: awake, alert  and oriented  Airway & Oxygen Therapy: Patient Spontanous Breathing and Patient connected to face mask oxygen  Post-op Assessment: Report given to RN and Post -op Vital signs reviewed and stable  Post vital signs: Reviewed and stable  Last Vitals:  Vitals Value Taken Time  BP 126/72 03/28/21 1651  Temp    Pulse 87 03/28/21 1653  Resp 15 03/28/21 1654  SpO2 100 % 03/28/21 1653  Vitals shown include unvalidated device data.  Last Pain:  Vitals:   03/28/21 1511  TempSrc: Temporal  PainSc: 0-No pain         Complications: No notable events documented.

## 2021-03-28 NOTE — Anesthesia Preprocedure Evaluation (Signed)
Anesthesia Evaluation  Patient identified by MRN, date of birth, ID band Patient awake    Reviewed: Allergy & Precautions, NPO status , Patient's Chart, lab work & pertinent test results  History of Anesthesia Complications (+) AWARENESS UNDER ANESTHESIA and history of anesthetic complications  Airway Mallampati: I  TM Distance: >3 FB Neck ROM: Full    Dental no notable dental hx. (+) Teeth Intact   Pulmonary neg pulmonary ROS, neg sleep apnea, neg COPD, Patient abstained from smoking.Not current smoker,    Pulmonary exam normal breath sounds clear to auscultation       Cardiovascular Exercise Tolerance: Good METS(-) hypertension(-) CAD and (-) Past MI (-) dysrhythmias  Rhythm:Regular Rate:Normal - Systolic murmurs    Neuro/Psych PSYCHIATRIC DISORDERS Anxiety Depression negative neurological ROS     GI/Hepatic GERD  Medicated and Controlled,(+)     (-) substance abuse  ,   Endo/Other  diabetes  Renal/GU negative Renal ROS     Musculoskeletal   Abdominal   Peds  Hematology   Anesthesia Other Findings Past Medical History: No date: Anxiety No date: Complication of anesthesia     Comment:  Remembers part of surgery from rhinoplasty and woke up               during another surgery No date: Depression No date: Endometriosis No date: GERD (gastroesophageal reflux disease) No date: Gestational diabetes No date: Insomnia  Reproductive/Obstetrics                             Anesthesia Physical Anesthesia Plan  ASA: 2  Anesthesia Plan: General   Post-op Pain Management:    Induction: Intravenous  PONV Risk Score and Plan: 3 and Ondansetron, Dexamethasone and Midazolam  Airway Management Planned: LMA  Additional Equipment: None  Intra-op Plan:   Post-operative Plan: Extubation in OR  Informed Consent: I have reviewed the patients History and Physical, chart, labs and discussed  the procedure including the risks, benefits and alternatives for the proposed anesthesia with the patient or authorized representative who has indicated his/her understanding and acceptance.     Dental advisory given  Plan Discussed with: CRNA and Surgeon  Anesthesia Plan Comments: (Discussed risks of anesthesia with patient, including PONV, sore throat, lip/dental damage. Rare risks discussed as well, such as cardiorespiratory and neurological sequelae, and allergic reactions. Reassured patient very low chance of true awareness and recall under this particular type of anesthesia. Patient understands.)        Anesthesia Quick Evaluation

## 2021-03-28 NOTE — Discharge Instructions (Addendum)
Orthopedic discharge instructions: Keep dressing dry and intact. Keep hand elevated above heart level. May shower after dressing removed on postop day 4 (Saturday). Cover sutures with Band-Aids after drying off. Apply ice to affected area frequently. Take ibuprofen 600-800 mg TID with meals for 5-7 days, then as necessary. Take ES Tylenol or pain medication as prescribed when needed.  Return for follow-up in 10-14 days or as scheduled.  AMBULATORY SURGERY  DISCHARGE INSTRUCTIONS   The drugs that you were given will stay in your system until tomorrow so for the next 24 hours you should not:  Drive an automobile Make any legal decisions Drink any alcoholic beverage   You may resume regular meals tomorrow.  Today it is better to start with liquids and gradually work up to solid foods.  You may eat anything you prefer, but it is better to start with liquids, then soup and crackers, and gradually work up to solid foods.   Please notify your doctor immediately if you have any unusual bleeding, trouble breathing, redness and pain at the surgery site, drainage, fever, or pain not relieved by medication.    Additional Instructions:     Pt may drive Thursday 9/79.      Please contact your physician with any problems or Same Day Surgery at [phone], Monday through Friday 6 am to 4 pm, or [place] at [facility] number at [phone].

## 2021-03-28 NOTE — Op Note (Signed)
03/28/2021  5:06 PM  Patient:   Gabrielle Malone  Pre-Op Diagnosis:   1.  Recurrent Dupuytren's contracture, right palm. 2.  Right ring trigger finger.  Post-Op Diagnosis:   Same  Procedure:   1.  Release of Dupuytren's contracture, right palm. 2.  Release right ring trigger finger.  Surgeon:   Pascal Lux, MD  Assistant:   Rennis Chris, PA-S  Anesthesia:   General LMA  Findings:   As above.  Complications:   None  EBL:   1 cc  Fluids:   1000 cc crystalloid  TT:   42 minutes at 250 mmHg  Drains:   None  Closure:   4-0 Prolene interrupted sutures  Brief Clinical Note:   The patient is a 65 year old female who is now nearly 6 months status post a debridement of a Dupuytren's contracture of her right palm with a right long trigger finger release. Despite extensive physical therapy and home exercises, the patient continues to have difficulty with long finger range of motion. On examination, he has thickened scarring along the palmar incision which may be interfering with her ability to flex her finger. In addition, she now notes a recurrent painful catching of her right ring finger, consistent with a right ring trigger finger. The patient presents at this time for release of the recurrent Dupuytren's contracture of the right palm as well as for release of a right ring trigger finger.  Procedure:   The patient was brought into the operating room and lain in the supine position. After adequate general laryngeal mask anesthesia was achieved, the right hand and upper extremity were prepped with ChloraPrep solution before being draped sterilely. Preoperative antibiotics were administered. A timeout to verify the appropriate surgical site before the limb was exsanguinated with an Esmarch and the tourniquet inflated to 250 mmHg.    Utilizing the previous incision, a Brunner type zigzag incision was made along the volar aspect of the right palm centered over the long metacarpal beginning along  the thenar crease proximally and extending to the distal palmar crease over the long metacarpal head distally. The incision was carried down through subcutaneous tissues and the diseased flaps elevated sufficiently to expose the areas of scar tissue. The scar tissue was felt to be consistent with a recurrent Dupuytren's contracture. This tissue was identified and carefully dissected out from proximal to distal after releasing it proximally. As dissection was carried out, care was taken to identify and protect the common digital nerve and artery on either side of the cord, as well as the underlying flexor tendon. Incidental note was made of some significantly thickened tenosynovium along the long flexor tendons which appeared to be focally constricting the tendons and limiting their vascular supply. After the mass was removed in its entirety, the adequacy of excision was verified by palpation as well as visually.  In addition, the right long finger MCP and PIP joints could be flexed and extended fully.  Next, the right ring trigger finger was addressed.  A 1.5 cm transverse incision was made over the volar aspect of the right ring finger at the level of the metacarpal head centered over the flexor sheath, beginning at the angle of the Brunner incision made over the long finger. The incision was carried down through the subcutaneous tissues with care taken to identify and protect the digital neurovascular structures. The flexor sheath was entered just proximal to the A1 pulley. The sheath was released proximally for several centimeters under direct visualization. Distally,  a clamp was placed beneath the A1 pulley and used to release any adhesions. The clamp was repositioned so that one jaw was superficial to and the other jaw deep to the A1 pulley. The A1 pulley was incised on either side of the clamp to remove a 2 mm strip of tissue. Metzenbaum scissors were used to ensure complete release of the A1 pulley more  distally. The underlying tendons were carefully inspected and found to be intact.  The wound was copiously irrigated with sterile saline solution before the skin was reapproximated using 4-0 Prolene interrupted sutures. A total of 10 cc of 0.5% plain Sensorcaine was injected in and around the incision to help with postoperative analgesia before a sterile bulky dressing was applied. The patient was then awakened, extubated, and returned to the recovery room in satisfactory condition after tolerating the procedure well.

## 2021-03-29 ENCOUNTER — Encounter: Payer: Self-pay | Admitting: Surgery

## 2021-03-30 ENCOUNTER — Ambulatory Visit: Payer: BC Managed Care – PPO | Attending: Surgery | Admitting: Occupational Therapy

## 2021-03-30 DIAGNOSIS — M25641 Stiffness of right hand, not elsewhere classified: Secondary | ICD-10-CM | POA: Diagnosis present

## 2021-03-30 DIAGNOSIS — L905 Scar conditions and fibrosis of skin: Secondary | ICD-10-CM | POA: Insufficient documentation

## 2021-03-30 DIAGNOSIS — M79641 Pain in right hand: Secondary | ICD-10-CM | POA: Diagnosis present

## 2021-03-30 DIAGNOSIS — M6281 Muscle weakness (generalized): Secondary | ICD-10-CM | POA: Diagnosis present

## 2021-03-30 LAB — SURGICAL PATHOLOGY

## 2021-03-30 NOTE — Therapy (Signed)
[facility] [address], [place], [address] Phone: [phone]   Fax:  [phone]  Occupational Therapy Evaluation  Patient Details  Name: Gabrielle Malone MRN: [id number] Date of Birth: 1955/07/26 Referring Provider (OT): Dr Roland Rack   Encounter Date: 03/30/2021   OT End of Session - 03/30/21 1503     Visit Number 1    Number of Visits 18    Date for OT Re-Evaluation 06/08/21    OT Start Time 1200    OT Stop Time 0076    OT Time Calculation (min) 35 min    Activity Tolerance Patient tolerated treatment well    Behavior During Therapy [place] for tasks assessed/performed             Past Medical History:  Diagnosis Date   Anxiety    Complication of anesthesia    Remembers part of surgery from rhinoplasty and woke up during another surgery   Depression    Endometriosis    GERD (gastroesophageal reflux disease)    Gestational diabetes    Insomnia     Past Surgical History:  Procedure Laterality Date   carpal tunnel     CESAREAN SECTION     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   Procedure: DUPUYTREN CONTRACTURE RELEASE RIGHT HAND AND RELEASE OF RIGHT RING TRIGGER FINGER;  Surgeon: Corky Mull, MD;  Location: [facility] OR[facility];  Service: Orthopedics;  Laterality: Right;   GASTRIC BYPASS     OOPHORECTOMY     RHINOPLASTY     TRIGGER FINGER RELEASE Right 10/13/2020   Procedure: RIGHT MIDDLE FINGER 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;  Surgeon: Corky Mull, MD;  Location: [facility] OR[facility];  Service: Orthopedics;  Laterality: Right;    There were no vitals filed for this visit.   Subjective Assessment - 03/30/21 1455     Subjective  I had my surgery 3 days ago - it bled some the other day and my bandage bloody - I left message with them -but did not hear back anything - some pain but try not to take something for pain    Pertinent History The patient is a 65 year old female who  is now nearly 6 months status post a debridement of a Dupuytren's contracture of her right palm with a right long trigger finger release. Despite extensive OT and home exercises, the patient continues to have difficulty with long finger range of motion. On examination, he has thickened scarring along the palmar incision which may be interfering with her ability to flex her finger. In addition, she now notes a recurrent painful catching of her right ring finger, consistent with a right ring trigger finger. The patient presents at this time for release of the recurrent Dupuytren's contracture of the right palm as well as for release of a right ring trigger finger.   03/28/21- and now refer to OT to start in 1-3 days    Patient Stated Goals To be able to get full motion in my R hand  so I can use it normally and pain/tenderness better    Currently in Pain? Yes    Pain Score 3     Pain Location Hand    Pain Orientation Right    Pain Descriptors / Indicators Aching;Tender;Tightness;Sore    Pain Type Surgical pain    Pain Onset More than a month ago    Pain Frequency Constant  [facility] OT Assessment - 03/30/21 0001       Assessment   Medical Diagnosis R dupuytrens scar release,, 4th trigger finger release    Referring Provider (OT) Dr Roland Rack    Onset Date/Surgical Date 03/28/21    Hand Dominance Right    Next MD Visit 30th Sept    Prior Therapy with 3rd digit trigger finger relase and dupuytrens release      Home  Environment   Lives With Alone      Prior Function   Vocation Full time employment    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[employer], like walk, kayak, travel      AROM   Right Wrist Extension 50 Degrees    Right Wrist Flexion 80 Degrees      Right Hand AROM   R Thumb Opposition to Index --   side of 5th   R Index  MCP 0-90 75 Degrees    R Index PIP 0-100 70 Degrees    R Long  MCP 0-90 85 Degrees    R Long PIP 0-100 70 Degrees    R Ring  MCP 0-90 75 Degrees    R Ring PIP 0-100 70  Degrees    R Little  MCP 0-90 85 Degrees    R Little PIP 0-100 80 Degrees              Bandage removed - tubigrip D donn prior to contrast and done HEP with it on  Provided pt 3 to use with HEP         OT Treatments/Exercises (OP) - 03/30/21 0001       RUE Contrast Bath   Time 8 minutes    Comments prior to AROM review - tubidgrip over hand           Tapping of digits AROM extention - palm on table  10 reps  Tendon glides- pain free -and block - AROM only 10 reps Opposition to all digits AROM 10 reps New dressing apply - xeroform and 2 quazes and ace wrap  Pt remind of not pushing , pulling , forcing motion - follow surgeon instructions          OT Education - 03/30/21 1502     Education Details findings of eval and HEP    Person(s) Educated Patient    Methods Explanation;Demonstration;Tactile cues;Verbal cues;Handout    Comprehension Verbal cues required;Returned demonstration;Verbalized understanding              OT Short Term Goals - 03/30/21 1509       OT SHORT TERM GOAL #1   Title Pt to be independeint in HEP to decrease scar tissue, edema and pain to touch palm with all digits    Baseline pain 3-5  and stitches in place - Z scar  surgery was 3 days ago - Flexion of MC's 75-85, PIP's 70-80 - decrease extention - at [facility]    Time 3    Period Weeks    Status New    Target Date 04/20/21               OT Long Term Goals - 03/30/21 1511       OT LONG TERM GOAL #1   Title Pt to make composite fist with R hand touching palm without increase symptoms    Baseline pain and scar tissue 3-5/10 -Z scar in palm - incision in place - MC's 75-85 and PIP's 70-80 - decrease extention of MC's    Time  5    Period Weeks    Status New    Target Date 05/04/21      OT LONG TERM GOAL #2   Title R grip and prehension strength increase to WNL compare to L to carry groceries, kayak and use tools without symptoms    Baseline NT - pt 3 days out of surgery     Time 10    Period Weeks    Status New    Target Date 06/08/21      OT LONG TERM GOAL #3   Title R hand digits extention increase to WNL to put hand in pocket and no issue typing on computer    Baseline incision in palm and stitches in place - decrease MC extention - and pull and pain - not able to lay hand flat - need reminder to extend digits during tendon glides    Time 4    Period Weeks    Status New    Target Date 04/27/21                   Plan - 03/30/21 1504     Clinical Impression Statement The patient is a 65 year old female who is nearly 6 months status post a debridement of a Dupuytren's contracture of her right palm with a right long trigger finger release. Despite extensive OT and HEP, the patient continues to had difficulty with long finger range of motion and had thickened scarring along the palmar incision which was interfering with her ability to flex her finger and had painful catching of her right ring finger, consistent with a right ring trigger finger.  She had dupuytrens scar release and 4th digit trigger finger released on 03/30/21 - Order by Dr Roland Rack to start immediately ROM - pt this date with insicion and stitches in place- rebandage - and pt ed on HEP for AROM - increase edema ,pain and stiffness in R dominant - limiting her functional use - pt can benefit from skilled OT services - did observe pt lifting and holding heavy pocket book and pushing down on hand on table wtih wrist extention - reinforce for pt that stitches still in place - no forcefull ROM , pushing or pulling.    OT Occupational Profile and History Problem Focused Assessment - Including review of records relating to presenting problem    Occupational performance deficits (Please refer to evaluation for details): ADL's;IADL's;Work;Play;Leisure;Social Participation    Body Structure / Function / Physical Skills ADL;Strength;Pain;Edema;UE functional use;ROM;Scar mobility;Flexibility;FMC;Decreased  knowledge of precautions;Skin integrity    Rehab Potential Good    Clinical Decision Making Limited treatment options, no task modification necessary    Comorbidities Affecting Occupational Performance: None    Modification or Assistance to Complete Evaluation  No modification of tasks or assist necessary to complete eval    OT Frequency 2x / week    OT Duration --   10 wks   OT Treatment/Interventions Self-care/ADL training;Splinting;Manual Therapy;Paraffin;Passive range of motion;Scar mobilization;Therapeutic exercise;Contrast Bath;Fluidtherapy;Cryotherapy;Patient/family education    Consulted and Agree with Plan of Care Patient             Patient will benefit from skilled therapeutic intervention in order to improve the following deficits and impairments:   Body Structure / Function / Physical Skills: ADL, Strength, Pain, Edema, UE functional use, ROM, Scar mobility, Flexibility, FMC, Decreased knowledge of precautions, Skin integrity       Visit Diagnosis: Scar condition and fibrosis of skin - Plan: Ot plan of  care cert/re-cert  Stiffness of right hand, not elsewhere classified - Plan: Ot plan of care cert/re-cert  Pain in right hand - Plan: Ot plan of care cert/re-cert  Muscle weakness (generalized) - Plan: Ot plan of care cert/re-cert    Problem List Patient Active Problem List   Diagnosis Date Noted   Anxiety 04/23/2017   Depression 04/23/2017   Gestational diabetes 04/23/2017   Endometriosis 04/23/2017   Menstrual problem 04/23/2017   Status post normal childbirth 04/23/2017   Physical exam, organ donor 02/05/2013   Pre-op evaluation 02/05/2013   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    Rosalyn Gess, OTR/L,CLT 03/30/2021, 3:15 PM  [facility] [address] [place], [address] Phone: [phone]   Fax:  [phone]  Name: Gabrielle Malone MRN: [id number] Date of Birth: 1956-02-20

## 2021-04-04 ENCOUNTER — Ambulatory Visit: Payer: BC Managed Care – PPO | Admitting: Occupational Therapy

## 2021-04-04 DIAGNOSIS — M25641 Stiffness of right hand, not elsewhere classified: Secondary | ICD-10-CM

## 2021-04-04 DIAGNOSIS — L905 Scar conditions and fibrosis of skin: Secondary | ICD-10-CM | POA: Diagnosis not present

## 2021-04-04 DIAGNOSIS — M79641 Pain in right hand: Secondary | ICD-10-CM

## 2021-04-04 DIAGNOSIS — M6281 Muscle weakness (generalized): Secondary | ICD-10-CM

## 2021-04-04 NOTE — Therapy (Signed)
[facility] [address], [place], [address] Phone: [phone]   Fax:  [phone]  Occupational Therapy Treatment  Patient Details  Name: Gabrielle Malone MRN: [id number] Date of Birth: [birth year] Referring Provider (OT): Dr Roland Rack   Encounter Date: 04/04/2021   OT End of Session - 04/04/21 1542     Visit Number 2    Number of Visits 18    Date for OT Re-Evaluation 06/08/21    OT Start Time 53    OT Stop Time 1611    OT Time Calculation (min) 41 min    Activity Tolerance Patient tolerated treatment well    Behavior During Therapy [facility] for tasks assessed/performed             Past Medical History:  Diagnosis Date   Anxiety    Complication of anesthesia    Remembers part of surgery from rhinoplasty and woke up during another surgery   Depression    Endometriosis    GERD (gastroesophageal reflux disease)    Gestational diabetes    Insomnia     Past Surgical History:  Procedure Laterality Date   carpal tunnel     CESAREAN SECTION     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   Procedure: DUPUYTREN CONTRACTURE RELEASE RIGHT HAND AND RELEASE OF RIGHT RING TRIGGER FINGER;  Surgeon: Corky Mull, MD;  Location: [facility];  Service: Orthopedics;  Laterality: Right;   GASTRIC BYPASS     OOPHORECTOMY     RHINOPLASTY     TRIGGER FINGER RELEASE Right 10/13/2020   Procedure: RIGHT MIDDLE FINGER 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;  Surgeon: Corky Mull, MD;  Location: [facility];  Service: Orthopedics;  Laterality: Right;    There were no vitals filed for this visit.   Subjective Assessment - 04/04/21 1537     Subjective  I am trying to get at least 2 x during day -pain at the most 4/10 - I am trying to not over do things using it - bandage change every day    Pertinent History The patient is a 65 year old female who is now nearly 6 months status post a debridement of  a Dupuytren's contracture of her right palm with a right long trigger finger release. Despite extensive OT and home exercises, the patient continues to have difficulty with long finger range of motion. On examination, he has thickened scarring along the palmar incision which may be interfering with her ability to flex her finger. In addition, she now notes a recurrent painful catching of her right ring finger, consistent with a right ring trigger finger. The patient presents at this time for release of the recurrent Dupuytren's contracture of the right palm as well as for release of a right ring trigger finger.   03/28/21- and now refer to OT to start in 1-3 days    Patient Stated Goals To be able to get full motion in my R hand  so I can use it normally and pain/tenderness better    Currently in Pain? No/denies                [facility] OT Assessment - 04/04/21 0001       AROM   Right Wrist Extension 62 Degrees    Right Wrist Flexion 95 Degrees      Right Hand AROM   R Index  MCP 0-90 85 Degrees    R Index PIP 0-100 85 Degrees  R Long  MCP 0-90 90 Degrees    R Long PIP 0-100 85 Degrees    R Ring  MCP 0-90 90 Degrees    R Ring PIP 0-100 85 Degrees    R Little  MCP 0-90 90 Degrees    R Little PIP 0-100 80 Degrees                Bandage removed - tubigrip D donn prior to contrast  and bandaid kept in place           OT Treatments/Exercises (OP) - 04/04/21 0001       RUE Contrast Bath   Time 8 minutes    Comments prior to AROM decrease stiffness                     Tapping of digits AROM extention - palm on table  10 reps  Pt to not do any PROM for extention  Tendon glides- - gentle PROM for MC flexion with IP extention Then PIP/DIP flexion PROM 3rd and 4th gentle  pain free  - then block intrinsic fist  PROM gentle composite - pain free 10 reps And then composite fist pain free- -but initiate out of tips of digits  10 reps Opposition to all digits AROM  10 reps New dressing apply - xeroform and 1 quaze and tubigrip Pt remind of no pushing , pulling , forcing motion  extention- follow surgeon instructions                Pt making great progress since last week in edema , and AROM for flexion after contrast - done better this date after gentle PROM for intrinsic fist and composite fist prior to block AROM -and pt to initiate composite fist out of tips of digits - keep pain under 2/10       OT Education - 04/04/21 1542     Education Details progress and HEP    Person(s) Educated Patient    Methods Explanation;Demonstration;Tactile cues;Verbal cues;Handout    Comprehension Verbal cues required;Returned demonstration;Verbalized understanding              OT Short Term Goals - 03/30/21 1509       OT SHORT TERM GOAL #1   Title Pt to be independeint in HEP to decrease scar tissue, edema and pain to touch palm with all digits    Baseline pain 3-5  and stitches in place - Z scar  surgery was 3 days ago - Flexion of MC's 75-85, PIP's 70-80 - decrease extention - at [facility]    Time 3    Period Weeks    Status New    Target Date 04/20/21               OT Long Term Goals - 03/30/21 1511       OT LONG TERM GOAL #1   Title Pt to make composite fist with R hand touching palm without increase symptoms    Baseline pain and scar tissue 3-5/10 -Z scar in palm - incision in place - MC's 75-85 and PIP's 70-80 - decrease extention of MC's    Time 5    Period Weeks    Status New    Target Date 05/04/21      OT LONG TERM GOAL #2   Title R grip and prehension strength increase to WNL compare to L to carry groceries, kayak and use tools without symptoms    Baseline NT -  pt 3 days out of surgery    Time 10    Period Weeks    Status New    Target Date 06/08/21      OT LONG TERM GOAL #3   Title R hand digits extention increase to WNL to put hand in pocket and no issue typing on computer    Baseline incision in palm and stitches in  place - decrease MC extention - and pull and pain - not able to lay hand flat - need reminder to extend digits during tendon glides    Time 4    Period Weeks    Status New    Target Date 04/27/21                   Plan - 04/04/21 1543     OT Occupational Profile and History Problem Focused Assessment - Including review of records relating to presenting problem    Occupational performance deficits (Please refer to evaluation for details): ADL's;IADL's;Work;Play;Leisure;Social Participation    Body Structure / Function / Physical Skills ADL;Strength;Pain;Edema;UE functional use;ROM;Scar mobility;Flexibility;FMC;Decreased knowledge of precautions;Skin integrity    Rehab Potential Good    Clinical Decision Making Limited treatment options, no task modification necessary    Comorbidities Affecting Occupational Performance: None    Modification or Assistance to Complete Evaluation  No modification of tasks or assist necessary to complete eval    OT Frequency 2x / week    OT Duration 8 weeks    OT Treatment/Interventions Self-care/ADL training;Splinting;Manual Therapy;Paraffin;Passive range of motion;Scar mobilization;Therapeutic exercise;Contrast Bath;Fluidtherapy;Cryotherapy;Patient/family education    Consulted and Agree with Plan of Care Patient             Patient will benefit from skilled therapeutic intervention in order to improve the following deficits and impairments:   Body Structure / Function / Physical Skills: ADL, Strength, Pain, Edema, UE functional use, ROM, Scar mobility, Flexibility, FMC, Decreased knowledge of precautions, Skin integrity       Visit Diagnosis: Scar condition and fibrosis of skin  Stiffness of right hand, not elsewhere classified  Pain in right hand  Muscle weakness (generalized)    Problem List Patient Active Problem List   Diagnosis Date Noted   Anxiety 04/23/2017   Depression 04/23/2017   Gestational diabetes 04/23/2017    Endometriosis 04/23/2017   Menstrual problem 04/23/2017   Status post normal childbirth 04/23/2017   Physical exam, organ donor 02/05/2013   Pre-op evaluation 02/05/2013   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    Rosalyn Gess, OTR/L,CLT 04/04/2021, 4:13 PM  [facility] [address], [place], [address] Phone: [phone]   Fax:  [phone]  Name: Gabrielle Malone MRN: [id number] Date of Birth: 1956/03/29

## 2021-04-10 ENCOUNTER — Ambulatory Visit: Payer: BC Managed Care – PPO | Attending: Surgery | Admitting: Occupational Therapy

## 2021-04-10 DIAGNOSIS — M79641 Pain in right hand: Secondary | ICD-10-CM | POA: Diagnosis present

## 2021-04-10 DIAGNOSIS — M654 Radial styloid tenosynovitis [de Quervain]: Secondary | ICD-10-CM | POA: Insufficient documentation

## 2021-04-10 DIAGNOSIS — L905 Scar conditions and fibrosis of skin: Secondary | ICD-10-CM | POA: Diagnosis not present

## 2021-04-10 DIAGNOSIS — M25532 Pain in left wrist: Secondary | ICD-10-CM | POA: Insufficient documentation

## 2021-04-10 DIAGNOSIS — M6281 Muscle weakness (generalized): Secondary | ICD-10-CM | POA: Diagnosis present

## 2021-04-10 DIAGNOSIS — M25641 Stiffness of right hand, not elsewhere classified: Secondary | ICD-10-CM | POA: Diagnosis present

## 2021-04-10 NOTE — Therapy (Signed)
[name] [address], [place], [address] Phone: [phone]   Fax:  [phone]  Occupational Therapy Treatment  Patient Details  Name: Gabrielle Malone MRN: [id number] Date of Birth: 10/25/1955 Referring Provider (OT): Dr Roland Rack   Encounter Date: 04/10/2021   OT End of Session - 04/10/21 0829     Visit Number 3    Number of Visits 18    Date for OT Re-Evaluation 06/08/21    OT Start Time 0828    OT Stop Time 0858    OT Time Calculation (min) 30 min    Activity Tolerance Patient tolerated treatment well    Behavior During Therapy [facility] for tasks assessed/performed             Past Medical History:  Diagnosis Date   Anxiety    Complication of anesthesia    Remembers part of surgery from rhinoplasty and woke up during another surgery   Depression    Endometriosis    GERD (gastroesophageal reflux disease)    Gestational diabetes    Insomnia     Past Surgical History:  Procedure Laterality Date   carpal tunnel     CESAREAN SECTION     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   Procedure: DUPUYTREN CONTRACTURE RELEASE RIGHT HAND AND RELEASE OF RIGHT RING TRIGGER FINGER;  Surgeon: Corky Mull, MD;  Location: [facility] ORS;  Service: Orthopedics;  Laterality: Right;   GASTRIC BYPASS     OOPHORECTOMY     RHINOPLASTY     TRIGGER FINGER RELEASE Right 10/13/2020   Procedure: RIGHT MIDDLE FINGER 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;  Surgeon: Corky Mull, MD;  Location: [facility] OR[facility];  Service: Orthopedics;  Laterality: Right;    There were no vitals filed for this visit.   Subjective Assessment - 04/10/21 0829     Subjective  My skin cracked open last night - I had to go and get my husband at rehab down [place] -and needs to cook and cut food more - as well as something drop yesterday and I tried to reach and catch it - I think one stitch popped - going back tomorrow to get  stitches out - so it hurts now more    Pertinent History The patient is a 65 year old female who is now nearly 6 months status post a debridement of a Dupuytren's contracture of her right palm with a right long trigger finger release. Despite extensive OT and home exercises, the patient continues to have difficulty with long finger range of motion. On examination, he has thickened scarring along the palmar incision which may be interfering with her ability to flex her finger. In addition, she now notes a recurrent painful catching of her right ring finger, consistent with a right ring trigger finger. The patient presents at this time for release of the recurrent Dupuytren's contracture of the right palm as well as for release of a right ring trigger finger.   03/28/21- and now refer to OT to start in 1-3 days    Patient Stated Goals To be able to get full motion in my R hand  so I can use it normally and pain/tenderness better    Currently in Pain? Yes    Pain Score 5     Pain Location Hand    Pain Orientation Right    Pain Descriptors / Indicators Aching;Tightness;Tender    Pain Type Surgical pain    Pain Onset  More than a month ago    Pain Frequency Intermittent                [facility] OT Assessment - 04/10/21 0001       Right Hand AROM   R Long  MCP 0-90 80 Degrees    R Long PIP 0-100 70 Degrees                      OT Treatments/Exercises (OP) - 04/10/21 0001       Moist Heat Therapy   Number Minutes Moist Heat --    Moist Heat Location --      RUE Contrast Bath   Time 8 minutes    Comments prior to AROM to decrease stiffness             Remind again pt NOTdo any PROM for extention    Tendon glides- - gentle PROM for MC flexion with IP extention Then PIP/DIP flexion PROM 3rd and 4th gentle  pain free  - then block intrinsic fist  PROM gentle composite - pain free 10 reps And then composite fist pain free- -but initiate out of tips of digits  10  reps Opposition to all digits AROM 10 reps New dressing apply - xeroform , band aid and tubigrip Pt remind of no pushing , pulling , forcing motion  extention- follow surgeon instructions                        OT Education - 04/10/21 0837     Education Details progress and HEP    Person(s) Educated Patient    Methods Explanation;Demonstration;Tactile cues;Verbal cues;Handout    Comprehension Verbal cues required;Returned demonstration;Verbalized understanding              OT Short Term Goals - 03/30/21 1509       OT SHORT TERM GOAL #1   Title Pt to be independeint in HEP to decrease scar tissue, edema and pain to touch palm with all digits    Baseline pain 3-5  and stitches in place - Z scar  surgery was 3 days ago - Flexion of MC's 75-85, PIP's 70-80 - decrease extention - at [facility]    Time 3    Period Weeks    Status New    Target Date 04/20/21               OT Long Term Goals - 03/30/21 1511       OT LONG TERM GOAL #1   Title Pt to make composite fist with R hand touching palm without increase symptoms    Baseline pain and scar tissue 3-5/10 -Z scar in palm - incision in place - MC's 75-85 and PIP's 70-80 - decrease extention of MC's    Time 5    Period Weeks    Status New    Target Date 05/04/21      OT LONG TERM GOAL #2   Title R grip and prehension strength increase to WNL compare to L to carry groceries, kayak and use tools without symptoms    Baseline NT - pt 3 days out of surgery    Time 10    Period Weeks    Status New    Target Date 06/08/21      OT LONG TERM GOAL #3   Title R hand digits extention increase to WNL to put hand in pocket and no issue typing on  computer    Baseline incision in palm and stitches in place - decrease MC extention - and pull and pain - not able to lay hand flat - need reminder to extend digits during tendon glides    Time 4    Period Weeks    Status New    Target Date 04/27/21                    Plan - 04/10/21 0858     Clinical Impression Statement The patient is a 65 year old female who is nearly 6 months status post a debridement of a Dupuytren's contracture of her right palm with a right long trigger finger release. Despite extensive OT and HEP, the patient continues to had difficulty with long finger range of motion and had thickened scarring along the palmar incision which was interfering with her ability to flex her finger and had painful catching of her right ring finger, consistent with a right ring trigger finger.  She had dupuytrens scar release and 4th digit trigger finger released on 03/30/21 - Order by Dr Roland Rack to start immediately ROM - pt seen for 3rd session and made great progress in edema and AROM of digits extentoin and able to do gentle PROM for flexion last week pain free- BUT this date pt arrive with stitch pop on ulnar side -and report she had to drive down [place] to get husband at rehab and using hand more at home- REINFORCE again for pt to not over do ROM , use - NOT PROM extention - pt need multiple reminders -  pt to keep pain under 2/10 - rebandage - and pt ed on HEP   - cont with  increase edema ,pain and stiffness in R dominant - limiting her functional use - pt can benefit from skilled OT services - did observe pt lifting and holding heavy pocket book and pushing down on hand on table wtih wrist extention - reinforce for pt that stitches still in place - no forcefull ROM , pushing or pulling.    OT Occupational Profile and History Problem Focused Assessment - Including review of records relating to presenting problem    Occupational performance deficits (Please refer to evaluation for details): ADL's;IADL's;Work;Play;Leisure;Social Participation    Body Structure / Function / Physical Skills ADL;Strength;Pain;Edema;UE functional use;ROM;Scar mobility;Flexibility;FMC;Decreased knowledge of precautions;Skin integrity    Rehab Potential Good    Clinical Decision Making  Limited treatment options, no task modification necessary    Comorbidities Affecting Occupational Performance: None    Modification or Assistance to Complete Evaluation  No modification of tasks or assist necessary to complete eval    OT Frequency 2x / week    OT Duration 8 weeks    OT Treatment/Interventions Self-care/ADL training;Splinting;Manual Therapy;Paraffin;Passive range of motion;Scar mobilization;Therapeutic exercise;Contrast Bath;Fluidtherapy;Cryotherapy;Patient/family education    Consulted and Agree with Plan of Care Patient             Patient will benefit from skilled therapeutic intervention in order to improve the following deficits and impairments:   Body Structure / Function / Physical Skills: ADL, Strength, Pain, Edema, UE functional use, ROM, Scar mobility, Flexibility, FMC, Decreased knowledge of precautions, Skin integrity       Visit Diagnosis: Scar condition and fibrosis of skin  Stiffness of right hand, not elsewhere classified  Pain in right hand  Muscle weakness (generalized)    Problem List Patient Active Problem List   Diagnosis Date Noted   Anxiety 04/23/2017   Depression 04/23/2017  Gestational diabetes 04/23/2017   Endometriosis 04/23/2017   Menstrual problem 04/23/2017   Status post normal childbirth 04/23/2017   Physical exam, organ donor 02/05/2013   Pre-op evaluation 02/05/2013   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    Rosalyn Gess, OTR/L,CLT 04/10/2021, 9:03 AM  [facility] [address], [place], [address] Phone: [phone]   Fax:  [phone]  Name: Gabrielle Malone MRN: [id number] Date of Birth: 1956-03-06

## 2021-04-13 ENCOUNTER — Ambulatory Visit: Payer: BC Managed Care – PPO | Admitting: Occupational Therapy

## 2021-04-13 DIAGNOSIS — M79641 Pain in right hand: Secondary | ICD-10-CM

## 2021-04-13 DIAGNOSIS — L905 Scar conditions and fibrosis of skin: Secondary | ICD-10-CM

## 2021-04-13 DIAGNOSIS — M25641 Stiffness of right hand, not elsewhere classified: Secondary | ICD-10-CM

## 2021-04-13 DIAGNOSIS — M6281 Muscle weakness (generalized): Secondary | ICD-10-CM

## 2021-04-13 NOTE — Therapy (Signed)
[facility] [address] [place], [address] Phone: [phone]   Fax:  [phone]  Occupational Therapy Treatment  Patient Details  Name: Gabrielle Malone MRN: [id number] Date of Birth: 1956-05-01 Referring Provider (OT): Dr Roland Rack   Encounter Date: 04/13/2021   OT End of Session - 04/13/21 1704     Visit Number 4    Number of Visits 18    Date for OT Re-Evaluation 06/08/21    OT Start Time 1405    OT Stop Time 1445    OT Time Calculation (min) 40 min    Activity Tolerance Patient tolerated treatment well    Behavior During Therapy [facility] for tasks assessed/performed             Past Medical History:  Diagnosis Date   Anxiety    Complication of anesthesia    Remembers part of surgery from rhinoplasty and woke up during another surgery   Depression    Endometriosis    GERD (gastroesophageal reflux disease)    Gestational diabetes    Insomnia     Past Surgical History:  Procedure Laterality Date   carpal tunnel     CESAREAN SECTION     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   Procedure: DUPUYTREN CONTRACTURE RELEASE RIGHT HAND AND RELEASE OF RIGHT RING TRIGGER FINGER;  Surgeon: Corky Mull, MD;  Location: [facility] ORS;  Service: Orthopedics;  Laterality: Right;   GASTRIC BYPASS     OOPHORECTOMY     RHINOPLASTY     TRIGGER FINGER RELEASE Right 10/13/2020   Procedure: RIGHT MIDDLE FINGER 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;  Surgeon: Corky Mull, MD;  Location: [facility] OR[facility];  Service: Orthopedics;  Laterality: Right;    There were no vitals filed for this visit.   Subjective Assessment - 04/13/21 1408     Subjective  I got the stitches taken off Tues and put on sterri strips but they came off when I was washing dishes - mostly used the L hand but R got wet- can I ride bike yet, cut fruit- this area still open    Pertinent History The patient is a 65 year old  female who is now nearly 6 months status post a debridement of a Dupuytren's contracture of her right palm with a right long trigger finger release. Despite extensive OT and home exercises, the patient continues to have difficulty with long finger range of motion. On examination, he has thickened scarring along the palmar incision which may be interfering with her ability to flex her finger. In addition, she now notes a recurrent painful catching of her right ring finger, consistent with a right ring trigger finger. The patient presents at this time for release of the recurrent Dupuytren's contracture of the right palm as well as for release of a right ring trigger finger.   03/28/21- and now refer to OT to start in 1-3 days    Patient Stated Goals To be able to get full motion in my R hand  so I can use it normally and pain/tenderness better    Currently in Pain? Yes    Pain Score 2     Pain Location Hand    Pain Orientation Right    Pain Descriptors / Indicators Aching;Tightness;Tender    Pain Type Surgical pain    Pain Onset More than a month ago    Pain Frequency Intermittent  [facility] OT Assessment - 04/13/21 0001       Right Hand PROM   R Index  MCP 0-90 90 Degrees    R Index PIP 0-100 100 Degrees    R Long  MCP 0-90 90 Degrees    R Long PIP 0-100 100 Degrees    R Ring  MCP 0-90 90 Degrees    R Ring PIP 0-100 100 Degrees    R Little  MCP 0-90 90 Degrees    R Little PIP 0-100 100 Degrees                 Remind again pt NOTdo any PROM for extention  ,pushing ,pulling , tight fist gripping   Tendon glides- - gentle PROM for MC flexion with IP extention Then PIP/DIP flexion PROM 3rd and 4th gentle  pain free  - then block intrinsic fist  PROM gentle composite - pain free 10 reps And then composite fist pain free- -but initiate out of tips of digits  10 reps Opposition to all digits AROM 10 reps Sterri strips applied again - and dry skin removed prior and  notice pt had bandaid on coming in -and report since stitches come off area  next day after stitches came out - incision not close and using her hand to feed animal at home, help her husband prepare food - had had hip fx -  Review with pt infection control  Pt remind of no pushing , pulling , forcing motion  extention- follow surgeon instructions                  OT Education - 04/13/21 1704     Education Details progress and HEP    Person(s) Educated Patient    Methods Explanation;Demonstration;Tactile cues;Verbal cues;Handout    Comprehension Verbal cues required;Returned demonstration;Verbalized understanding              OT Short Term Goals - 03/30/21 1509       OT SHORT TERM GOAL #1   Title Pt to be independeint in HEP to decrease scar tissue, edema and pain to touch palm with all digits    Baseline pain 3-5  and stitches in place - Z scar  surgery was 3 days ago - Flexion of MC's 75-85, PIP's 70-80 - decrease extention - at [facility]    Time 3    Period Weeks    Status New    Target Date 04/20/21               OT Long Term Goals - 03/30/21 1511       OT LONG TERM GOAL #1   Title Pt to make composite fist with R hand touching palm without increase symptoms    Baseline pain and scar tissue 3-5/10 -Z scar in palm - incision in place - MC's 75-85 and PIP's 70-80 - decrease extention of MC's    Time 5    Period Weeks    Status New    Target Date 05/04/21      OT LONG TERM GOAL #2   Title R grip and prehension strength increase to WNL compare to L to carry groceries, kayak and use tools without symptoms    Baseline NT - pt 3 days out of surgery    Time 10    Period Weeks    Status New    Target Date 06/08/21      OT LONG TERM GOAL #3   Title R hand  digits extention increase to WNL to put hand in pocket and no issue typing on computer    Baseline incision in palm and stitches in place - decrease MC extention - and pull and pain - not able to lay hand  flat - need reminder to extend digits during tendon glides    Time 4    Period Weeks    Status New    Target Date 04/27/21                   Plan - 04/13/21 1705     Clinical Impression Statement The patient is a 65 year old female who is nearly 6 months status post a debridement of a Dupuytren's contracture of her right palm with a right long trigger finger release. Despite extensive OT and HEP, the patient continues to had difficulty with long finger range of motion and had thickened scarring along the palmar incision which was interfering with her ability to flex her finger and had painful catching of her right ring finger, consistent with a right ring trigger finger.  She had dupuytrens scar release and 4th digit trigger finger released on 03/30/21 - Order by Dr Roland Rack to start immediately ROM - pt seen for 4th  session and made great progress in edema and  PROM flexion, AROM of digits extentoin - Pt this date show without sterristrips on - stitches removed earlier this week -and open - appear to have infection on ulnar side of incision - pus - pt ed on infection control - watch her touch and pull on incision - and report feeing her dogs and cats - and using hand to help her husband - she also report she had to drive down [place] to get husband at rehab and using hand more at home since this past weekend- REINFORCE again for pt to not over do ROM , or  use - NOT PROM extention - pt need multiple reminders -  pt to keep pain under 2/10 - rebandage - and pt ed on HEP   -message Dr Roland Rack about appear pt has infection -  cont with  increase edema ,pain and stiffness in R dominant - limiting her functional use - pt can benefit from skilled OT services - did observe pt lifting and holding heavy pocket book and pushing down on hand on table wtih wrist extention since surgery - reinforce  no forcefull ROM , pushing or pulling.    OT Occupational Profile and History Problem Focused Assessment - Including  review of records relating to presenting problem    Occupational performance deficits (Please refer to evaluation for details): ADL's;IADL's;Work;Play;Leisure;Social Participation    Body Structure / Function / Physical Skills ADL;Strength;Pain;Edema;UE functional use;ROM;Scar mobility;Flexibility;FMC;Decreased knowledge of precautions;Skin integrity    Rehab Potential Good    Clinical Decision Making Limited treatment options, no task modification necessary    Comorbidities Affecting Occupational Performance: None    Modification or Assistance to Complete Evaluation  No modification of tasks or assist necessary to complete eval    OT Frequency 2x / week    OT Duration 8 weeks    OT Treatment/Interventions Self-care/ADL training;Splinting;Manual Therapy;Paraffin;Passive range of motion;Scar mobilization;Therapeutic exercise;Contrast Bath;Fluidtherapy;Cryotherapy;Patient/family education    Consulted and Agree with Plan of Care Patient             Patient will benefit from skilled therapeutic intervention in order to improve the following deficits and impairments:   Body Structure / Function / Physical Skills: ADL, Strength, Pain, Edema, UE  functional use, ROM, Scar mobility, Flexibility, FMC, Decreased knowledge of precautions, Skin integrity       Visit Diagnosis: Scar condition and fibrosis of skin  Stiffness of right hand, not elsewhere classified  Pain in right hand  Muscle weakness (generalized)    Problem List Patient Active Problem List   Diagnosis Date Noted   Anxiety 04/23/2017   Depression 04/23/2017   Gestational diabetes 04/23/2017   Endometriosis 04/23/2017   Menstrual problem 04/23/2017   Status post normal childbirth 04/23/2017   Physical exam, organ donor 02/05/2013   Pre-op evaluation 02/05/2013   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    Rosalyn Gess, OT/RL,CLT 04/13/2021, 5:10 PM  [facility] [address], [place], [address] Phone: [phone]   Fax:  [phone]  Name: Gabrielle Malone MRN: [id number] Date of Birth: 1955-08-07

## 2021-04-18 ENCOUNTER — Ambulatory Visit: Payer: BC Managed Care – PPO | Admitting: Occupational Therapy

## 2021-04-18 DIAGNOSIS — M25641 Stiffness of right hand, not elsewhere classified: Secondary | ICD-10-CM

## 2021-04-18 DIAGNOSIS — M79641 Pain in right hand: Secondary | ICD-10-CM

## 2021-04-18 DIAGNOSIS — L905 Scar conditions and fibrosis of skin: Secondary | ICD-10-CM

## 2021-04-18 DIAGNOSIS — M6281 Muscle weakness (generalized): Secondary | ICD-10-CM

## 2021-04-18 NOTE — Therapy (Signed)
[facility] [address] [place], [address] Phone: [phone]   Fax:  [phone]  Occupational Therapy Treatment  Patient Details  Name: Gabrielle Malone MRN: [id number] Date of Birth: 05-18-1956 Referring Provider (OT): Dr Roland Rack   Encounter Date: 04/18/2021   OT End of Session - 04/18/21 1335     Visit Number 5    Number of Visits 18    Date for OT Re-Evaluation 06/08/21    OT Start Time 9371    OT Stop Time 1404    OT Time Calculation (min) 47 min    Activity Tolerance Patient tolerated treatment well    Behavior During Therapy [facility] for tasks assessed/performed             Past Medical History:  Diagnosis Date   Anxiety    Complication of anesthesia    Remembers part of surgery from rhinoplasty and woke up during another surgery   Depression    Endometriosis    GERD (gastroesophageal reflux disease)    Gestational diabetes    Insomnia     Past Surgical History:  Procedure Laterality Date   carpal tunnel     CESAREAN SECTION     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   Procedure: DUPUYTREN CONTRACTURE RELEASE RIGHT HAND AND RELEASE OF RIGHT RING TRIGGER FINGER;  Surgeon: Corky Mull, MD;  Location: [facility];  Service: Orthopedics;  Laterality: Right;   GASTRIC BYPASS     OOPHORECTOMY     RHINOPLASTY     TRIGGER FINGER RELEASE Right 10/13/2020   Procedure: RIGHT MIDDLE FINGER 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;  Surgeon: Corky Mull, MD;  Location: [facility];  Service: Orthopedics;  Laterality: Right;    There were no vitals filed for this visit.   Subjective Assessment - 04/18/21 1332     Subjective  Started my antibiotic last Friday and doing well - I am not pulling , pushing and gripping things to tight - You can tell me when I can start doing things again - did not cut to many things    Pertinent History The patient is a 65 year old female who  is now nearly 6 months status post a debridement of a Dupuytren's contracture of her right palm with a right long trigger finger release. Despite extensive OT and home exercises, the patient continues to have difficulty with long finger range of motion. On examination, he has thickened scarring along the palmar incision which may be interfering with her ability to flex her finger. In addition, she now notes a recurrent painful catching of her right ring finger, consistent with a right ring trigger finger. The patient presents at this time for release of the recurrent Dupuytren's contracture of the right palm as well as for release of a right ring trigger finger.   03/28/21- and now refer to OT to start in 1-3 days    Patient Stated Goals To be able to get full motion in my R hand  so I can use it normally and pain/tenderness better    Currently in Pain? Yes    Pain Score 1     Pain Location Hand    Pain Orientation Right    Pain Descriptors / Indicators Aching;Tightness;Tender    Pain Type Surgical pain    Pain Onset More than a month ago    Pain Frequency Intermittent  [facility] OT Assessment - 04/18/21 0001       Right Hand AROM   R Long  MCP 0-90 90 Degrees    R Long PIP 0-100 85 Degrees             Incision closed and cleared the dry skin this date - close and pt can start scar massage on the medial side of scar  Cica scar pad for night time use under tubigrip          OT Treatments/Exercises (OP) - 04/18/21 0001       Moist Heat Therapy   Number Minutes Moist Heat 6 Minutes    Moist Heat Location Hand   prior to scar massage             Rolling over red roller for extention for digits 20 reps  Prior to light prayer stretch - stretch to be at volar wrist  10 reps  Graston tool nr 2 sweeping over volar forearm and wrist prior to ROM    Tendon glides- - gentle PROM for MC flexion with IP extention Then PIP/DIP flexion PROM 3rd and 4th gentle   pain free  - then block intrinsic fist  PROM gentle composite - pain free 10 reps And then composite fist pain free- -but initiate out of tips of digits  10 reps And REINFORCE COMPOSITE extention  Opposition to all digits AROM 10 reps Pt remind of no pushing , pulling , forcing motion  extention- ad gripping tight small objects         OT Education - 04/18/21 1335     Education Details progress and HEP    Person(s) Educated Patient    Methods Explanation;Demonstration;Tactile cues;Verbal cues;Handout    Comprehension Verbal cues required;Returned demonstration;Verbalized understanding              OT Short Term Goals - 03/30/21 1509       OT SHORT TERM GOAL #1   Title Pt to be independeint in HEP to decrease scar tissue, edema and pain to touch palm with all digits    Baseline pain 3-5  and stitches in place - Z scar  surgery was 3 days ago - Flexion of MC's 75-85, PIP's 70-80 - decrease extention - at [facility]    Time 3    Period Weeks    Status New    Target Date 04/20/21               OT Long Term Goals - 03/30/21 1511       OT LONG TERM GOAL #1   Title Pt to make composite fist with R hand touching palm without increase symptoms    Baseline pain and scar tissue 3-5/10 -Z scar in palm - incision in place - MC's 75-85 and PIP's 70-80 - decrease extention of MC's    Time 5    Period Weeks    Status New    Target Date 05/04/21      OT LONG TERM GOAL #2   Title R grip and prehension strength increase to WNL compare to L to carry groceries, kayak and use tools without symptoms    Baseline NT - pt 3 days out of surgery    Time 10    Period Weeks    Status New    Target Date 06/08/21      OT LONG TERM GOAL #3   Title R hand digits extention increase to WNL to put hand in pocket  and no issue typing on computer    Baseline incision in palm and stitches in place - decrease MC extention - and pull and pain - not able to lay hand flat - need reminder to extend  digits during tendon glides    Time 4    Period Weeks    Status New    Target Date 04/27/21                   Plan - 04/18/21 1336     Clinical Impression Statement The patient is a 65 year old female who is nearly 6 months status post a debridement of a Dupuytren's contracture of her right palm with a right long trigger finger release. Despite extensive OT and HEP, the patient continues to had difficulty with long finger range of motion and had thickened scarring along the palmar incision which was interfering with her ability to flex her finger and had painful catching of her right ring finger, consistent with a right ring trigger finger.  She had dupuytrens scar release and 4th digit trigger finger released on 03/30/21 - Order by Dr Roland Rack to start immediately ROM - pt seen for 4th  session and made great progress in edema and  PROM flexion, AROM of digits extentoin -- last week pt present at OT session with infection in at incision - pt was started on antibiotics since last Friday and incision closed this date - dry skin cleared by OT and pt ed on scar massage and PROM and AROM for wrist and hand -  cont with  increase stiffness more than edema and pain this date  in R dominant - limiting her functional use - pt can benefit from skilled OT services - did observe pt lifting and holding heavy pocket book and pushing down on hand on table wtih wrist extention since surgery - reinforce  no forcefull ROM , pushing or pulling.    OT Occupational Profile and History Problem Focused Assessment - Including review of records relating to presenting problem    Occupational performance deficits (Please refer to evaluation for details): ADL's;IADL's;Work;Play;Leisure;Social Participation    Body Structure / Function / Physical Skills ADL;Strength;Pain;Edema;UE functional use;ROM;Scar mobility;Flexibility;FMC;Decreased knowledge of precautions;Skin integrity    Rehab Potential Good    Clinical Decision  Making Limited treatment options, no task modification necessary    Comorbidities Affecting Occupational Performance: None    Modification or Assistance to Complete Evaluation  No modification of tasks or assist necessary to complete eval    OT Frequency 2x / week    OT Duration 8 weeks    OT Treatment/Interventions Self-care/ADL training;Splinting;Manual Therapy;Paraffin;Passive range of motion;Scar mobilization;Therapeutic exercise;Contrast Bath;Fluidtherapy;Cryotherapy;Patient/family education    Consulted and Agree with Plan of Care Patient             Patient will benefit from skilled therapeutic intervention in order to improve the following deficits and impairments:   Body Structure / Function / Physical Skills: ADL, Strength, Pain, Edema, UE functional use, ROM, Scar mobility, Flexibility, FMC, Decreased knowledge of precautions, Skin integrity       Visit Diagnosis: Stiffness of right hand, not elsewhere classified  Pain in right hand  Muscle weakness (generalized)  Scar condition and fibrosis of skin    Problem List Patient Active Problem List   Diagnosis Date Noted   Anxiety 04/23/2017   Depression 04/23/2017   Gestational diabetes 04/23/2017   Endometriosis 04/23/2017   Menstrual problem 04/23/2017   Status post normal childbirth 04/23/2017  Physical exam, organ donor 02/05/2013   Pre-op evaluation 02/05/2013   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    Rosalyn Gess, OTR/L,CLT 04/18/2021, 4:52 PM  [facility] [address] [place], [address] Phone: [phone]   Fax:  [phone]  Name: Gabrielle Malone MRN: [id number] Date of Birth: [birth year]

## 2021-04-20 ENCOUNTER — Ambulatory Visit: Payer: BC Managed Care – PPO | Admitting: Occupational Therapy

## 2021-04-20 DIAGNOSIS — M25641 Stiffness of right hand, not elsewhere classified: Secondary | ICD-10-CM

## 2021-04-20 DIAGNOSIS — M79641 Pain in right hand: Secondary | ICD-10-CM

## 2021-04-20 DIAGNOSIS — L905 Scar conditions and fibrosis of skin: Secondary | ICD-10-CM | POA: Diagnosis not present

## 2021-04-20 DIAGNOSIS — M25532 Pain in left wrist: Secondary | ICD-10-CM

## 2021-04-20 DIAGNOSIS — M6281 Muscle weakness (generalized): Secondary | ICD-10-CM

## 2021-04-20 DIAGNOSIS — M654 Radial styloid tenosynovitis [de Quervain]: Secondary | ICD-10-CM

## 2021-04-20 NOTE — Therapy (Signed)
[facility] [address], [place], [address] Phone: [phone]   Fax:  [phone]  Occupational Therapy Treatment  Patient Details  Name: Gabrielle Malone MRN: [id number] Date of Birth: May 13, 1956 Referring Provider (OT): Dr Roland Rack   Encounter Date: 04/20/2021   OT End of Session - 04/20/21 1628     Visit Number 6    Number of Visits 18    Date for OT Re-Evaluation 06/08/21    OT Start Time 4010    OT Stop Time 1515    OT Time Calculation (min) 30 min    Activity Tolerance Patient tolerated treatment well             Past Medical History:  Diagnosis Date   Anxiety    Complication of anesthesia    Remembers part of surgery from rhinoplasty and woke up during another surgery   Depression    Endometriosis    GERD (gastroesophageal reflux disease)    Gestational diabetes    Insomnia     Past Surgical History:  Procedure Laterality Date   carpal tunnel     CESAREAN SECTION     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   Procedure: DUPUYTREN CONTRACTURE RELEASE RIGHT HAND AND RELEASE OF RIGHT RING TRIGGER FINGER;  Surgeon: Corky Mull, MD;  Location: [facility];  Service: Orthopedics;  Laterality: Right;   GASTRIC BYPASS     OOPHORECTOMY     RHINOPLASTY     TRIGGER FINGER RELEASE Right 10/13/2020   Procedure: RIGHT MIDDLE FINGER 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;  Surgeon: Corky Mull, MD;  Location: [facility];  Service: Orthopedics;  Laterality: Right;    There were no vitals filed for this visit.   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  Despite extensive OT and home exercises, the patient continues to have difficulty with long finger range of motion. On examination, he has thickened scarring along the palmar incision which may be interfering with her ability to flex her finger. In addition, she now notes a recurrent painful catching of her right ring finger, consistent with a right ring trigger finger. The patient presents at this time for release of the recurrent Dupuytren's contracture of the right palm as well as for release of a right ring trigger finger.   03/28/21- and now refer to OT to start in 1-3 days    Patient Stated Goals To be able to get full motion in my R hand  so I can use it normally and pain/tenderness better    Currently in Pain? No/denies                  Incision closed this date - and pt ed not to cover it up anymore- keep open and do scar massage and desensitization         OT Treatments/Exercises (OP) - 04/20/21 0001       Moist Heat Therapy   Number Minutes Moist Heat 6 Minutes    Moist Heat Location Hand  scar massage done by OT and mini massager on mature scar - and ed on use of CICA scar pad use  Rolling over red roller for extention for digits 20 reps  Prior to light prayer stretch - stretch to be at volar wrist -  Review again  10 reps  Graston tool nr 2 sweeping over volar forearm and wrist prior to ROM    Tendon glides- - gentle PROM for MC flexion with IP extention Then PIP/DIP flexion PROM 3rd and 4th gentle  pain free  - then block intrinsic fist  PROM composite flexion- including DIP - pain free 10 reps And then composite fist pain free- -but initiate out of tips of digits  and PLACE and HOLD 10 reps And REINFORCE COMPOSITE extention inbetween Opposition to all digits AROM 10 reps        OT Education - 04/20/21 1628     Education Details progress and HEP    Person(s) Educated Patient    Methods Explanation;Demonstration;Tactile cues;Verbal  cues;Handout    Comprehension Verbal cues required;Returned demonstration;Verbalized understanding              OT Short Term Goals - 03/30/21 1509       OT SHORT TERM GOAL #1   Title Pt to be independeint in HEP to decrease scar tissue, edema and pain to touch palm with all digits    Baseline pain 3-5  and stitches in place - Z scar  surgery was 3 days ago - Flexion of MC's 75-85, PIP's 70-80 - decrease extention - at [facility]    Time 3    Period Weeks    Status New    Target Date 04/20/21               OT Long Term Goals - 03/30/21 1511       OT LONG TERM GOAL #1   Title Pt to make composite fist with R hand touching palm without increase symptoms    Baseline pain and scar tissue 3-5/10 -Z scar in palm - incision in place - MC's 75-85 and PIP's 70-80 - decrease extention of MC's    Time 5    Period Weeks    Status New    Target Date 05/04/21      OT LONG TERM GOAL #2   Title R grip and prehension strength increase to WNL compare to L to carry groceries, kayak and use tools without symptoms    Baseline NT - pt 3 days out of surgery    Time 10    Period Weeks    Status New    Target Date 06/08/21      OT LONG TERM GOAL #3   Title R hand digits extention increase to WNL to put hand in pocket and no issue typing on computer    Baseline incision in palm and stitches in place - decrease MC extention - and pull and pain - not able to lay hand flat - need reminder to extend digits during tendon glides    Time 4    Period Weeks    Status New    Target Date 04/27/21                   Plan - 04/20/21 1629     Clinical Impression Statement The patient is a 65 year old female who is nearly 6 months status post a debridement of a Dupuytren's contracture of her right palm with a right long trigger finger  release. Despite extensive OT and HEP, the patient continues to had difficulty with long finger range of motion and had thickened scarring along the palmar incision  which was interfering with her ability to flex her finger and had painful catching of her right ring finger, consistent with a right ring trigger finger.  She had dupuytrens scar release and 4th digit trigger finger released on 03/30/21 - Order by Dr Roland Rack to start immediately ROM - pt seen for 5th  session and made great progress in edema and  PROM flexion, AROM of digits extentoin -- last week pt present at OT session with infection in at incision - pt was started on antibiotics since last Friday and incision closed this week - Pt ed on scar  massage and PROM and AROM for wrist and hand -  cont with  increase stiffness more than edema and pain this date  in R dominant - limiting her functional use - pt can benefit from skilled OT services - did observe pt lifting and holding heavy pocket book and pushing down on hand on table wtih wrist extention since surgery - reinforce  no forcefull ROM , pushing or pulling.    OT Occupational Profile and History Problem Focused Assessment - Including review of records relating to presenting problem    Occupational performance deficits (Please refer to evaluation for details): ADL's;IADL's;Work;Play;Leisure;Social Participation    Body Structure / Function / Physical Skills ADL;Strength;Pain;Edema;UE functional use;ROM;Scar mobility;Flexibility;FMC;Decreased knowledge of precautions;Skin integrity    Rehab Potential Good    Clinical Decision Making Limited treatment options, no task modification necessary    Comorbidities Affecting Occupational Performance: None    Modification or Assistance to Complete Evaluation  No modification of tasks or assist necessary to complete eval    OT Frequency 2x / week    OT Duration 8 weeks    OT Treatment/Interventions Self-care/ADL training;Splinting;Manual Therapy;Paraffin;Passive range of motion;Scar mobilization;Therapeutic exercise;Contrast Bath;Fluidtherapy;Cryotherapy;Patient/family education    Consulted and Agree with Plan  of Care Patient             Patient will benefit from skilled therapeutic intervention in order to improve the following deficits and impairments:   Body Structure / Function / Physical Skills: ADL, Strength, Pain, Edema, UE functional use, ROM, Scar mobility, Flexibility, FMC, Decreased knowledge of precautions, Skin integrity       Visit Diagnosis: Pain in right hand  Muscle weakness (generalized)  Scar condition and fibrosis of skin  Radial styloid tenosynovitis  Pain in left wrist  Stiffness of right hand, not elsewhere classified    Problem List Patient Active Problem List   Diagnosis Date Noted   Anxiety 04/23/2017   Depression 04/23/2017   Gestational diabetes 04/23/2017   Endometriosis 04/23/2017   Menstrual problem 04/23/2017   Status post normal childbirth 04/23/2017   Physical exam, organ donor 02/05/2013   Pre-op evaluation 02/05/2013   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    Rosalyn Gess, OTR/L,CLT 04/20/2021, 4:39 PM  [facility] [address] [place], [address] Phone: [phone]   Fax:  [phone]  Name: Myrtle Haller MRN: [id number] Date of Birth: 1956-05-13

## 2021-04-25 ENCOUNTER — Ambulatory Visit: Payer: BC Managed Care – PPO | Admitting: Occupational Therapy

## 2021-04-27 ENCOUNTER — Ambulatory Visit: Payer: BC Managed Care – PPO | Admitting: Occupational Therapy

## 2021-04-27 DIAGNOSIS — M6281 Muscle weakness (generalized): Secondary | ICD-10-CM

## 2021-04-27 DIAGNOSIS — L905 Scar conditions and fibrosis of skin: Secondary | ICD-10-CM | POA: Diagnosis not present

## 2021-04-27 DIAGNOSIS — M25641 Stiffness of right hand, not elsewhere classified: Secondary | ICD-10-CM

## 2021-04-27 DIAGNOSIS — M79641 Pain in right hand: Secondary | ICD-10-CM

## 2021-04-27 NOTE — Therapy (Signed)
[facility] [address], [place], [address] Phone: [phone]   Fax:  [phone]  Occupational Therapy Treatment  Patient Details  Name: Gabrielle Malone MRN: [id number] Date of Birth: 1955-11-07 Referring Provider (OT): Dr Roland Rack   Encounter Date: 04/27/2021   OT End of Session - 04/27/21 1423     Visit Number 7    Number of Visits 18    Date for OT Re-Evaluation 06/08/21    OT Start Time 1403    OT Stop Time 1440    OT Time Calculation (min) 37 min    Activity Tolerance Patient tolerated treatment well    Behavior During Therapy [facility] for tasks assessed/performed             Past Medical History:  Diagnosis Date   Anxiety    Complication of anesthesia    Remembers part of surgery from rhinoplasty and woke up during another surgery   Depression    Endometriosis    GERD (gastroesophageal reflux disease)    Gestational diabetes    Insomnia     Past Surgical History:  Procedure Laterality Date   carpal tunnel     CESAREAN SECTION     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   Procedure: DUPUYTREN CONTRACTURE RELEASE RIGHT HAND AND RELEASE OF RIGHT RING TRIGGER FINGER;  Surgeon: Corky Mull, MD;  Location: [facility] ORS;  Service: Orthopedics;  Laterality: Right;   GASTRIC BYPASS     OOPHORECTOMY     RHINOPLASTY     TRIGGER FINGER RELEASE Right 10/13/2020   Procedure: RIGHT MIDDLE FINGER 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;  Surgeon: Corky Mull, MD;  Location: [facility] ORS;  Service: Orthopedics;  Laterality: Right;    There were no vitals filed for this visit.   Subjective Assessment - 04/27/21 1421     Subjective  Doing okay - stiff in the morning - how long will that continue -and did climb a tree but only one handed    Pertinent History The patient is a 65 year old female who is now nearly 6 months status post a debridement of a Dupuytren's contracture of  her right palm with a right long trigger finger release. Despite extensive OT and home exercises, the patient continues to have difficulty with long finger range of motion. On examination, he has thickened scarring along the palmar incision which may be interfering with her ability to flex her finger. In addition, she now notes a recurrent painful catching of her right ring finger, consistent with a right ring trigger finger. The patient presents at this time for release of the recurrent Dupuytren's contracture of the right palm as well as for release of a right ring trigger finger.   03/28/21- and now refer to OT to start in 1-3 days    Patient Stated Goals To be able to get full motion in my R hand  so I can use it normally and pain/tenderness better    Currently in Pain? No/denies                     MC's 90 and PIP coming in 90 and in session 95 - extention tight because of scar tissue - decrease at [facility]      OT Treatments/Exercises (OP) - 04/27/21 0001       RUE Paraffin   Number Minutes Paraffin 8 Minutes    RUE Paraffin Location Hand    Comments  prior to soft tissue and ROM               scar massage done by OT and mini massager on - and demo and ed pt - reinforce precautions and pt demo with hers in clinic - and ed on use of CICA scar pad use  Rolling over red roller for extention for digits 20 reps  And table slides 20 reps   Tendon glides- AROM  MC flexion with IP extention Then PIP/DIP flexion PROM 3rd and 4th gentle  pain free   PROM composite flexion- including DIP - pain free 10 reps And then composite fist pain free- - PLACE and HOLD 10 reps And REINFORCE COMPOSITE extention in between - can do PROM  Opposition to all digits AROM 10        OT Education - 04/27/21 1422     Education Details progress and HEP    Person(s) Educated Patient    Methods Explanation;Demonstration;Tactile cues;Verbal cues;Handout    Comprehension Verbal cues  required;Returned demonstration;Verbalized understanding              OT Short Term Goals - 03/30/21 1509       OT SHORT TERM GOAL #1   Title Pt to be independeint in HEP to decrease scar tissue, edema and pain to touch palm with all digits    Baseline pain 3-5  and stitches in place - Z scar  surgery was 3 days ago - Flexion of MC's 75-85, PIP's 70-80 - decrease extention - at [facility]    Time 3    Period Weeks    Status New    Target Date 04/20/21               OT Long Term Goals - 03/30/21 1511       OT LONG TERM GOAL #1   Title Pt to make composite fist with R hand touching palm without increase symptoms    Baseline pain and scar tissue 3-5/10 -Z scar in palm - incision in place - MC's 75-85 and PIP's 70-80 - decrease extention of MC's    Time 5    Period Weeks    Status New    Target Date 05/04/21      OT LONG TERM GOAL #2   Title R grip and prehension strength increase to WNL compare to L to carry groceries, kayak and use tools without symptoms    Baseline NT - pt 3 days out of surgery    Time 10    Period Weeks    Status New    Target Date 06/08/21      OT LONG TERM GOAL #3   Title R hand digits extention increase to WNL to put hand in pocket and no issue typing on computer    Baseline incision in palm and stitches in place - decrease MC extention - and pull and pain - not able to lay hand flat - need reminder to extend digits during tendon glides    Time 4    Period Weeks    Status New    Target Date 04/27/21                   Plan - 04/27/21 1423     Clinical Impression Statement The patient is a 65 year old female who is nearly 6 months status post a debridement of a Dupuytren's contracture of her right palm with a right long trigger finger release. Despite extensive OT and  HEP, the patient continues to had difficulty with long finger range of motion and had thickened scarring along the palmar incision which was interfering with her ability to  flex her finger and had painful catching of her right ring finger, consistent with a right ring trigger finger.  She had dupuytrens scar release and 4th digit trigger finger released on 03/30/21 - Order by Dr Roland Rack to start immediately ROM -pt 4 wks s/p now and had infection 2 wks ago, done antibiotics  and incision closed last week -  able to do paraffin this date and able to start  more extention stretches and scar massage this week - Pt ed on scar  massage and PROM and place and hold - for flexion and extention now  -  cont with  increase stiffness more than edema and pain this date  in R dominant - limiting her functional use - pt can benefit from skilled OT services.    OT Occupational Profile and History Problem Focused Assessment - Including review of records relating to presenting problem    Occupational performance deficits (Please refer to evaluation for details): ADL's;IADL's;Work;Play;Leisure;Social Participation    Body Structure / Function / Physical Skills ADL;Strength;Pain;Edema;UE functional use;ROM;Scar mobility;Flexibility;FMC;Decreased knowledge of precautions;Skin integrity    Rehab Potential Good    Clinical Decision Making Limited treatment options, no task modification necessary    Comorbidities Affecting Occupational Performance: None    Modification or Assistance to Complete Evaluation  No modification of tasks or assist necessary to complete eval    OT Frequency 2x / week    OT Duration 8 weeks    OT Treatment/Interventions Self-care/ADL training;Splinting;Manual Therapy;Paraffin;Passive range of motion;Scar mobilization;Therapeutic exercise;Contrast Bath;Fluidtherapy;Cryotherapy;Patient/family education    Consulted and Agree with Plan of Care Patient             Patient will benefit from skilled therapeutic intervention in order to improve the following deficits and impairments:   Body Structure / Function / Physical Skills: ADL, Strength, Pain, Edema, UE functional  use, ROM, Scar mobility, Flexibility, FMC, Decreased knowledge of precautions, Skin integrity       Visit Diagnosis: Pain in right hand  Muscle weakness (generalized)  Scar condition and fibrosis of skin  Stiffness of right hand, not elsewhere classified    Problem List Patient Active Problem List   Diagnosis Date Noted   Anxiety 04/23/2017   Depression 04/23/2017   Gestational diabetes 04/23/2017   Endometriosis 04/23/2017   Menstrual problem 04/23/2017   Status post normal childbirth 04/23/2017   Physical exam, organ donor 02/05/2013   Pre-op evaluation 02/05/2013   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    Rosalyn Gess, OTR/L,CLT 04/27/2021, 2:49 PM  [facility] [address] [place], [address] Phone: [phone]   Fax:  [phone]  Name: Gabrielle Malone MRN: [id number] Date of Birth: May 06, 1956

## 2021-05-04 ENCOUNTER — Ambulatory Visit: Payer: BC Managed Care – PPO | Admitting: Occupational Therapy

## 2021-05-04 DIAGNOSIS — M6281 Muscle weakness (generalized): Secondary | ICD-10-CM

## 2021-05-04 DIAGNOSIS — M25532 Pain in left wrist: Secondary | ICD-10-CM

## 2021-05-04 DIAGNOSIS — M25641 Stiffness of right hand, not elsewhere classified: Secondary | ICD-10-CM

## 2021-05-04 DIAGNOSIS — M654 Radial styloid tenosynovitis [de Quervain]: Secondary | ICD-10-CM

## 2021-05-04 DIAGNOSIS — L905 Scar conditions and fibrosis of skin: Secondary | ICD-10-CM

## 2021-05-04 NOTE — Therapy (Signed)
[facility] [address], [place], [address] Phone: [phone]   Fax:  [phone]  Occupational Therapy Treatment  Patient Details  Name: Gabrielle Malone MRN: [id number] Date of Birth: 12/27/1955 Referring Provider (OT): Dr Roland Rack   Encounter Date: 05/04/2021   OT End of Session - 05/04/21 1425     Visit Number 8    Number of Visits 18    Date for OT Re-Evaluation 06/08/21    OT Start Time 1409    OT Stop Time 1446    OT Time Calculation (min) 37 min    Activity Tolerance Patient tolerated treatment well    Behavior During Therapy [facility] for tasks assessed/performed             Past Medical History:  Diagnosis Date   Anxiety    Complication of anesthesia    Remembers part of surgery from rhinoplasty and woke up during another surgery   Depression    Endometriosis    GERD (gastroesophageal reflux disease)    Gestational diabetes    Insomnia     Past Surgical History:  Procedure Laterality Date   carpal tunnel     CESAREAN SECTION     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   Procedure: DUPUYTREN CONTRACTURE RELEASE RIGHT HAND AND RELEASE OF RIGHT RING TRIGGER FINGER;  Surgeon: Corky Mull, MD;  Location: [facility] ORS;  Service: Orthopedics;  Laterality: Right;   GASTRIC BYPASS     OOPHORECTOMY     RHINOPLASTY     TRIGGER FINGER RELEASE Right 10/13/2020   Procedure: RIGHT MIDDLE FINGER 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;  Surgeon: Corky Mull, MD;  Location: [facility] OR[facility];  Service: Orthopedics;  Laterality: Right;    There were no vitals filed for this visit.   Subjective Assessment - 05/04/21 1427     Subjective  Doing better - able to use it more and no pain - just stiff and tight    Pertinent History The patient is a 65 year old female who is now nearly 6 months status post a debridement of a Dupuytren's contracture of her right palm with a right long  trigger finger release. Despite extensive OT and home exercises, the patient continues to have difficulty with long finger range of motion. On examination, he has thickened scarring along the palmar incision which may be interfering with her ability to flex her finger. In addition, she now notes a recurrent painful catching of her right ring finger, consistent with a right ring trigger finger. The patient presents at this time for release of the recurrent Dupuytren's contracture of the right palm as well as for release of a right ring trigger finger.   03/28/21- and now refer to OT to start in 1-3 days    Patient Stated Goals To be able to get full motion in my R hand  so I can use it normally and pain/tenderness better    Currently in Pain? No/denies                [facility] OT Assessment - 05/04/21 0001       Right Hand AROM   R Long  MCP 0-90 90 Degrees   -35   R Long PIP 0-100 90 Degrees    R Ring  MCP 0-90 --   -25            increase flexion and extention - tight and stiff coming in  -  AROM composite fist -stiff 10/10  Place and hold - fist 2/10  Tight in palm - ulnar scar in palm mostly          OT Treatments/Exercises (OP) - 05/04/21 0001       RUE Paraffin   Number Minutes Paraffin 8 Minutes    RUE Paraffin Location Hand    Comments extention stretch and prior to soft tissue             scar massage done by OT and mini massager with great improvement as well as xtractor use  - and demo and ed pt  last time  Review again importance on doing scar mobs , stretches for extention and use of CICA scar pad night time  Rolling over red roller for extention for digits 20 reps  Prayer stretch - and table slides 20 reps- stretch in palm     PROM composite flexion- including DIP - pain free 10 reps And then composite fist pain free- - PLACE and HOLD 10 reps And REINFORCE COMPOSITE extention in between - can do PROM  Opposition to all digits AROM 10  Several times  during day  And use paraffin at home        OT Education - 05/04/21 1426     Education Details progress and HEP    Person(s) Educated Patient    Methods Explanation;Demonstration;Tactile cues;Verbal cues;Handout    Comprehension Verbal cues required;Returned demonstration;Verbalized understanding              OT Short Term Goals - 03/30/21 1509       OT SHORT TERM GOAL #1   Title Pt to be independeint in HEP to decrease scar tissue, edema and pain to touch palm with all digits    Baseline pain 3-5  and stitches in place - Z scar  surgery was 3 days ago - Flexion of MC's 75-85, PIP's 70-80 - decrease extention - at [facility]    Time 3    Period Weeks    Status New    Target Date 04/20/21               OT Long Term Goals - 03/30/21 1511       OT LONG TERM GOAL #1   Title Pt to make composite fist with R hand touching palm without increase symptoms    Baseline pain and scar tissue 3-5/10 -Z scar in palm - incision in place - MC's 75-85 and PIP's 70-80 - decrease extention of MC's    Time 5    Period Weeks    Status New    Target Date 05/04/21      OT LONG TERM GOAL #2   Title R grip and prehension strength increase to WNL compare to L to carry groceries, kayak and use tools without symptoms    Baseline NT - pt 3 days out of surgery    Time 10    Period Weeks    Status New    Target Date 06/08/21      OT LONG TERM GOAL #3   Title R hand digits extention increase to WNL to put hand in pocket and no issue typing on computer    Baseline incision in palm and stitches in place - decrease MC extention - and pull and pain - not able to lay hand flat - need reminder to extend digits during tendon glides    Time 4    Period Weeks    Status  New    Target Date 04/27/21                   Plan - 05/04/21 1425     Clinical Impression Statement The patient is a 65 year old female who is nearly 6 months status post a debridement of a Dupuytren's contracture of her  right palm with a right long trigger finger release. Despite extensive OT and HEP, the patient continues to had difficulty with long finger range of motion and had thickened scarring along the palmar incision which was interfering with her ability to flex her finger and had painful catching of her right ring finger, consistent with a right ring trigger finger.  She had dupuytrens scar release and 4th digit trigger finger released on 03/30/21 - Order by Dr Roland Rack to start immediately ROM -pt 5 wks s/p now and had infection 3 wks ago, done antibiotics  and incision was delayed in healing -  able to do paraffin this date and work on  extention stretches and scar massage more - Responded great on soft tissue mobs in session after paraffin and extention stretches and PROM for composite flexion - pt to cont with scar  massage and PROM and place and hold - for flexion and extention now  - pt limited with  increase stiffness more than edema and pain this date  in R dominant - limiting her functional use - pt can benefit from skilled OT services.    OT Occupational Profile and History Problem Focused Assessment - Including review of records relating to presenting problem    Occupational performance deficits (Please refer to evaluation for details): ADL's;IADL's;Work;Play;Leisure;Social Participation    Body Structure / Function / Physical Skills ADL;Strength;Pain;Edema;UE functional use;ROM;Scar mobility;Flexibility;FMC;Decreased knowledge of precautions;Skin integrity    Rehab Potential Good    Clinical Decision Making Limited treatment options, no task modification necessary    Comorbidities Affecting Occupational Performance: None    Modification or Assistance to Complete Evaluation  No modification of tasks or assist necessary to complete eval    OT Frequency 2x / week    OT Duration 8 weeks    OT Treatment/Interventions Self-care/ADL training;Splinting;Manual Therapy;Paraffin;Passive range of motion;Scar  mobilization;Therapeutic exercise;Contrast Bath;Fluidtherapy;Cryotherapy;Patient/family education    Consulted and Agree with Plan of Care Patient             Patient will benefit from skilled therapeutic intervention in order to improve the following deficits and impairments:   Body Structure / Function / Physical Skills: ADL, Strength, Pain, Edema, UE functional use, ROM, Scar mobility, Flexibility, FMC, Decreased knowledge of precautions, Skin integrity       Visit Diagnosis: Muscle weakness (generalized)  Scar condition and fibrosis of skin  Stiffness of right hand, not elsewhere classified  Radial styloid tenosynovitis  Pain in left wrist    Problem List Patient Active Problem List   Diagnosis Date Noted   Anxiety 04/23/2017   Depression 04/23/2017   Gestational diabetes 04/23/2017   Endometriosis 04/23/2017   Menstrual problem 04/23/2017   Status post normal childbirth 04/23/2017   Physical exam, organ donor 02/05/2013   Pre-op evaluation 02/05/2013   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    Rosalyn Gess, OTR/L,CLT 05/04/2021, 7:49 PM  [facility] [address], [place], [address] Phone: [phone]   Fax:  [phone]  Name: Gabrielle Malone MRN: [id number] Date of Birth: Oct 07, 1955

## 2021-05-09 ENCOUNTER — Ambulatory Visit: Payer: BC Managed Care – PPO | Attending: Surgery | Admitting: Occupational Therapy

## 2021-05-09 DIAGNOSIS — M79641 Pain in right hand: Secondary | ICD-10-CM | POA: Diagnosis present

## 2021-05-09 DIAGNOSIS — M25641 Stiffness of right hand, not elsewhere classified: Secondary | ICD-10-CM | POA: Diagnosis present

## 2021-05-09 DIAGNOSIS — M6281 Muscle weakness (generalized): Secondary | ICD-10-CM | POA: Diagnosis present

## 2021-05-09 DIAGNOSIS — M654 Radial styloid tenosynovitis [de Quervain]: Secondary | ICD-10-CM

## 2021-05-09 DIAGNOSIS — L905 Scar conditions and fibrosis of skin: Secondary | ICD-10-CM | POA: Diagnosis present

## 2021-05-09 NOTE — Therapy (Signed)
[facility] [address], [place], [address] Phone: [phone]   Fax:  [phone]  Occupational Therapy Treatment  Patient Details  Name: Gabrielle Malone MRN: [id number] Date of Birth: 1956-02-08 Referring Provider (OT): Dr Roland Rack   Encounter Date: 05/09/2021   OT End of Session - 05/09/21 1617     Visit Number 9    Number of Visits 18    Date for OT Re-Evaluation 06/08/21    OT Start Time 1535    OT Stop Time 1615    OT Time Calculation (min) 40 min    Activity Tolerance Patient tolerated treatment well    Behavior During Therapy [facility] for tasks assessed/performed             Past Medical History:  Diagnosis Date   Anxiety    Complication of anesthesia    Remembers part of surgery from rhinoplasty and woke up during another surgery   Depression    Endometriosis    GERD (gastroesophageal reflux disease)    Gestational diabetes    Insomnia     Past Surgical History:  Procedure Laterality Date   carpal tunnel     CESAREAN SECTION     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   Procedure: DUPUYTREN CONTRACTURE RELEASE RIGHT HAND AND RELEASE OF RIGHT RING TRIGGER FINGER;  Surgeon: Corky Mull, MD;  Location: [facility] ORS;  Service: Orthopedics;  Laterality: Right;   GASTRIC BYPASS     OOPHORECTOMY     RHINOPLASTY     TRIGGER FINGER RELEASE Right 10/13/2020   Procedure: RIGHT MIDDLE FINGER 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;  Surgeon: Corky Mull, MD;  Location: [facility] ORS;  Service: Orthopedics;  Laterality: Right;    There were no vitals filed for this visit.   Subjective Assessment - 05/09/21 1615     Subjective  My hand is ache - used it a lot since last time cutting halloween things with scissors and using staple gun for things on board- and just stiff my hand- I need to get more paraffin for my paraffin bath - cannot use it    Pertinent History The patient  is a 65 year old female who is now nearly 6 months status post a debridement of a Dupuytren's contracture of her right palm with a right long trigger finger release. Despite extensive OT and home exercises, the patient continues to have difficulty with long finger range of motion. On examination, he has thickened scarring along the palmar incision which may be interfering with her ability to flex her finger. In addition, she now notes a recurrent painful catching of her right ring finger, consistent with a right ring trigger finger. The patient presents at this time for release of the recurrent Dupuytren's contracture of the right palm as well as for release of a right ring trigger finger.   03/28/21- and now refer to OT to start in 1-3 days    Patient Stated Goals To be able to get full motion in my R hand  so I can use it normally and pain/tenderness better    Currently in Pain? Yes    Pain Score 1     Pain Location Hand    Pain Orientation Right    Pain Descriptors / Indicators Aching;Tightness    Pain Type Surgical pain                [facility] OT Assessment - 05/09/21 0001  Strength   Right Hand Grip (lbs) 30    Right Hand Lateral Pinch 14 lbs    Right Hand 3 Point Pinch 10 lbs    Left Hand Grip (lbs) 48    Left Hand Lateral Pinch 13 lbs    Left Hand 3 Point Pinch 10 lbs      Right Hand AROM   R Long  MCP 0-90 100 Degrees    R Long PIP 0-100 95 Degrees    R Ring  MCP 0-90 90 Degrees    R Ring PIP 0-100 100 Degrees               Stiffness last week for AROM fisting was 10/10 - this date 3/10         OT Treatments/Exercises (OP) - 05/09/21 0001       RUE Paraffin   Number Minutes Paraffin 8 Minutes    RUE Paraffin Location Hand    Comments prior to scar tissue and increase ROM                scar massage done by OT and mini massager with great improvement as well as xtractor use  - and demo and ed pt  last week Review again importance on doing scar  mobs , stretches for extention and use of CICA scar pad night time  Rolling over red roller for extention for digits 20 reps  Prayer stretch - and table slides 20 reps- stretch in palm      PROM composite flexion- including DIP - pain free 10 reps And then composite fist pain free- - PLACE and HOLD 10 reps And REINFORCE COMPOSITE extention in between - can do PROM  Opposition to all digits AROM 10  Several times during day  And use paraffin at home - need to fix it  Recommend sping loaded scissors        OT Education - 05/09/21 1617     Education Details progress and HEP    Person(s) Educated Patient    Methods Explanation;Demonstration;Tactile cues;Verbal cues;Handout    Comprehension Verbal cues required;Returned demonstration;Verbalized understanding              OT Short Term Goals - 03/30/21 1509       OT SHORT TERM GOAL #1   Title Pt to be independeint in HEP to decrease scar tissue, edema and pain to touch palm with all digits    Baseline pain 3-5  and stitches in place - Z scar  surgery was 3 days ago - Flexion of MC's 75-85, PIP's 70-80 - decrease extention - at [facility]    Time 3    Period Weeks    Status New    Target Date 04/20/21               OT Long Term Goals - 03/30/21 1511       OT LONG TERM GOAL #1   Title Pt to make composite fist with R hand touching palm without increase symptoms    Baseline pain and scar tissue 3-5/10 -Z scar in palm - incision in place - MC's 75-85 and PIP's 70-80 - decrease extention of MC's    Time 5    Period Weeks    Status New    Target Date 05/04/21      OT LONG TERM GOAL #2   Title R grip and prehension strength increase to WNL compare to L to carry groceries, kayak and use tools without  symptoms    Baseline NT - pt 3 days out of surgery    Time 10    Period Weeks    Status New    Target Date 06/08/21      OT LONG TERM GOAL #3   Title R hand digits extention increase to WNL to put hand in pocket and no  issue typing on computer    Baseline incision in palm and stitches in place - decrease MC extention - and pull and pain - not able to lay hand flat - need reminder to extend digits during tendon glides    Time 4    Period Weeks    Status New    Target Date 04/27/21                   Plan - 05/09/21 1618     Clinical Impression Statement The patient is a 65 year old female who is nearly 6 months status post a debridement of a Dupuytren's contracture of her right palm with a right long trigger finger release. She had dupuytrens scar release and 4th digit trigger finger released on 03/30/21 - Order by Dr Roland Rack to start immediately ROM -NOW pt is 6 wks s/p had infection about 4 wks ago, done that caused some delayed in healing of incision -  able to do paraffin since last week and focus on scar massage and extention stretches more - Responded great on soft tissue mobs in session after paraffin and extention stretches and PROM for composite flexion - pt to cont with scar  massage and PROM and place and hold - for flexion and extention now  - pt limited moslty by stiffness more than edema and pain this date  in R dominant - limiting her functional use - pt can benefit from skilled OT services.    OT Occupational Profile and History Problem Focused Assessment - Including review of records relating to presenting problem    Occupational performance deficits (Please refer to evaluation for details): ADL's;IADL's;Work;Play;Leisure;Social Participation    Body Structure / Function / Physical Skills ADL;Strength;Pain;Edema;UE functional use;ROM;Scar mobility;Flexibility;FMC;Decreased knowledge of precautions;Skin integrity    Rehab Potential Good    Clinical Decision Making Limited treatment options, no task modification necessary    Comorbidities Affecting Occupational Performance: None    Modification or Assistance to Complete Evaluation  No modification of tasks or assist necessary to complete eval     OT Frequency 1x / week    OT Duration 8 weeks    OT Treatment/Interventions Self-care/ADL training;Splinting;Manual Therapy;Paraffin;Passive range of motion;Scar mobilization;Therapeutic exercise;Contrast Bath;Fluidtherapy;Cryotherapy;Patient/family education    Consulted and Agree with Plan of Care Patient             Patient will benefit from skilled therapeutic intervention in order to improve the following deficits and impairments:   Body Structure / Function / Physical Skills: ADL, Strength, Pain, Edema, UE functional use, ROM, Scar mobility, Flexibility, FMC, Decreased knowledge of precautions, Skin integrity       Visit Diagnosis: Muscle weakness (generalized)  Scar condition and fibrosis of skin  Stiffness of right hand, not elsewhere classified  Radial styloid tenosynovitis  Pain in right hand    Problem List Patient Active Problem List   Diagnosis Date Noted   Anxiety 04/23/2017   Depression 04/23/2017   Gestational diabetes 04/23/2017   Endometriosis 04/23/2017   Menstrual problem 04/23/2017   Status post normal childbirth 04/23/2017   Physical exam, organ donor 02/05/2013   Pre-op evaluation 02/05/2013  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    Rosalyn Gess, OTR/L,CLT 05/09/2021, 4:22 PM  [facility] [address] [place], [address] Phone: [phone]   Fax:  [phone]  Name: Gabrielle Malone MRN: [id number] Date of Birth: August 08, 1955

## 2021-05-10 ENCOUNTER — Encounter: Payer: BC Managed Care – PPO | Admitting: Occupational Therapy

## 2021-05-15 ENCOUNTER — Ambulatory Visit: Admit: 2021-05-15 | Payer: BC Managed Care – PPO | Admitting: Ophthalmology

## 2021-05-15 SURGERY — PHACOEMULSIFICATION, CATARACT, WITH IOL INSERTION
Anesthesia: Topical | Laterality: Right

## 2021-05-22 ENCOUNTER — Emergency Department
Admission: EM | Admit: 2021-05-22 | Discharge: 2021-05-23 | Disposition: A | Discharge status: Home / Self Care | Payer: BC Managed Care – PPO | Attending: Emergency Medicine | Admitting: Emergency Medicine

## 2021-05-22 ENCOUNTER — Emergency Department: Payer: BC Managed Care – PPO

## 2021-05-22 ENCOUNTER — Encounter: Payer: Self-pay | Admitting: Emergency Medicine

## 2021-05-22 DIAGNOSIS — F10120 Alcohol abuse with intoxication, uncomplicated: Secondary | ICD-10-CM | POA: Insufficient documentation

## 2021-05-22 DIAGNOSIS — Y905 Blood alcohol level of 100-119 mg/100 ml: Secondary | ICD-10-CM | POA: Insufficient documentation

## 2021-05-22 DIAGNOSIS — W108XXA Fall (on) (from) other stairs and steps, initial encounter: Secondary | ICD-10-CM | POA: Diagnosis not present

## 2021-05-22 DIAGNOSIS — S0181XA Laceration without foreign body of other part of head, initial encounter: Secondary | ICD-10-CM | POA: Diagnosis not present

## 2021-05-22 DIAGNOSIS — F10929 Alcohol use, unspecified with intoxication, unspecified: Secondary | ICD-10-CM

## 2021-05-22 DIAGNOSIS — S0003XA Contusion of scalp, initial encounter: Secondary | ICD-10-CM

## 2021-05-22 DIAGNOSIS — S2242XA Multiple fractures of ribs, left side, initial encounter for closed fracture: Secondary | ICD-10-CM

## 2021-05-22 DIAGNOSIS — S299XXA Unspecified injury of thorax, initial encounter: Secondary | ICD-10-CM | POA: Diagnosis present

## 2021-05-22 DIAGNOSIS — S0990XA Unspecified injury of head, initial encounter: Secondary | ICD-10-CM

## 2021-05-22 DIAGNOSIS — Y92009 Unspecified place in unspecified non-institutional (private) residence as the place of occurrence of the external cause: Secondary | ICD-10-CM | POA: Insufficient documentation

## 2021-05-22 DIAGNOSIS — W19XXXA Unspecified fall, initial encounter: Secondary | ICD-10-CM

## 2021-05-22 HISTORY — DX: Alcohol abuse, uncomplicated: F10.10

## 2021-05-22 NOTE — ED Triage Notes (Addendum)
Pt arrived via [facility] from home post unwitnessed fall down unknown wooden/carpeted stairs in home. (Approx 15 steps) Per family at home, pt is an alcoholic and takes Ambien at night while consuming alcohol. Pt has laceration to the posterior head and above the left eye. Family denies LOC. Daughter at bedside and reports pt not currently on blood thinners. Pt able to answer questions but having trouble staying awake. Pt able to transfer from EMS stretcher to ED bed with assistance, no pain reported with standing in place and transferring other than face and head.

## 2021-05-22 NOTE — ED Provider Notes (Signed)
[facility] - Closed Emergency Department Provider Note  ____________________________________________   Event Date/Time   First MD Initiated Contact with Patient 05/22/21 2317     (approximate)  I have reviewed the triage vital signs and the nursing notes.   HISTORY  Chief Complaint Fall  Level 5 caveat:  history/ROS limited by acute intoxication  HPI Gabrielle Malone is a 65 y.o. female with medical history as listed below which notably includes regular alcohol use.  She presents by EMS for evaluation after a fall down a flight of stairs.  She reports that she has had a small bottle of wine tonight as she usually does in the evenings.  She said she was "trying to get back to bed" and fell down a flight of stairs inside the house.  She landed on her back at the bottom of the stairs.  She does not believe that she lost consciousness.  She denies any pain in her head or neck but has some pain in the top of her left shoulder although she is able to move her arm.  She denies chest pain, abdominal pain, nausea, vomiting and any pain in her right arm or either leg.  She was able to ambulate a little bit for EMS.  She is awake and alert and calm at this time.  Onset was acute in the accident was reportedly severe based on the length of the staircase.  Patient reports that her last tetanus vaccination was within a year.   Past Medical History:  Diagnosis Date   Alcohol abuse    Anxiety    Complication of anesthesia    Remembers part of surgery from rhinoplasty and woke up during another surgery   Depression    Endometriosis    GERD (gastroesophageal reflux disease)    Gestational diabetes    Insomnia     Patient Active Problem List   Diagnosis Date Noted   Anxiety 04/23/2017   Depression 04/23/2017   Gestational diabetes 04/23/2017   Endometriosis 04/23/2017   Menstrual problem 04/23/2017   Status post normal childbirth 04/23/2017   Physical exam, organ donor  02/05/2013   Pre-op evaluation 02/05/2013   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    Past Surgical History:  Procedure Laterality Date   carpal tunnel     CESAREAN SECTION     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 Authorizing Provider  docusate sodium (COLACE) 100 MG capsule Take 1 tablet once or twice daily as needed for constipation while taking narcotic pain medicine 05/23/21  Yes Hinda Kehr, MD  oxyCODONE-acetaminophen (PERCOCET) 5-325 MG tablet Take 2 tablets by mouth every 6 (six) hours as needed for severe pain. 05/23/21  Yes Hinda Kehr, MD  amphetamine-dextroamphetamine (ADDERALL) 10 MG tablet Take 10 mg by mouth daily with breakfast. Can take up to twice daily 09/14/20   [provider]  cyanocobalamin (,VITAMIN B-12,) 1000 MCG/ML injection Inject 1,000 mcg into  the muscle every 30 (thirty) days.    [provider]  gabapentin (NEURONTIN) 300 MG capsule Take 150-300 mg by mouth as needed.    [provider]  omeprazole (PRILOSEC) 40 MG capsule Take 40 mg by mouth daily as needed (acid reflux). 04/20/17   [provider]  propranolol (INDERAL) 20 MG tablet Take 20 mg by mouth every morning.    [provider]  sertraline (ZOLOFT) 100 MG tablet Take 100 mg by mouth every morning.    [provider]  traMADol (ULTRAM) 50 MG tablet Take 1 tablet (50 mg total) by mouth every 6 (six)  hours as needed for moderate pain. 03/28/21 03/28/22  Poggi, Marshall Cork, MD  zolpidem (AMBIEN) 10 MG tablet Take 10 mg by mouth at bedtime. 04/08/17   [provider]    Allergies Patient has no known allergies.  Family History  Problem Relation Age of Onset   Dementia Mother    Cancer Father        lung,,melanoma   Dementia Maternal Grandmother    Breast cancer Maternal Grandmother     Social History Social History   Tobacco Use   Smoking status: Never   Smokeless tobacco: Never  Vaping Use   Vaping Use: Never used  Substance Use Topics   Alcohol use: Yes    Comment: nightly wine   Drug use: No    Review of Systems Level 5 caveat:  history/ROS limited by acute intoxication  ____________________________________________   PHYSICAL EXAM:  ED Triage Vitals [05/22/21 2329]  Enc Vitals Group     BP 128/90     Pulse Rate 98     Resp 18     Temp 98.4 F (36.9 C)     Temp Source Oral     SpO2 100 %     Weight      Height      Head Circumference      Peak Flow      Pain Score      Pain Loc      Pain Edu?      Excl. in [place]?      Constitutional: Awake and alert.  Oriented to self, location, and husband. Eyes: Conjunctivae are normal.  Pupils are equal and reactive. Head:  large hematoma with laceration 1.5 cm in a crescent shape above the left eyebrow and abrasion on left forehead, no active bleeding at this time but a large volume of blood is present on her face.  She also has a contusion and abrasion on the back of her head, but we can visualize no laceration after the nurse and I examined her carefully after her nurse cleaned of the blood from her hair and we carefully inspected visually to find any laceration. Nose: No congestion/rhinnorhea. Mouth/Throat: Patient is wearing a mask. Neck: No stridor.  No meningeal signs.   Cardiovascular: Normal rate, regular rhythm. Good peripheral circulation. Respiratory: Normal respiratory effort.  No  retractions. Gastrointestinal: Soft and nontender. No distention.  Musculoskeletal: No obvious injury to her extremities.  She is reporting pain to the top of her left shoulder but has normal range of motion with only minimal reproducible tenderness with full range of motion of the left shoulder. Neurologic:  Normal speech and language. No gross focal neurologic deficits are appreciated.  Skin:  Skin is warm and dry.  Laceration/contusion to left forehead and occipital scalp as described above.   ____________________________________________   LABS (all labs ordered are listed,  but only abnormal results are displayed)  Labs Reviewed  COMPREHENSIVE METABOLIC PANEL - Abnormal; Notable for the following components:      Result Value   Glucose, Bld 109 (*)    Calcium 8.5 (*)    All other components within normal limits  ETHANOL - Abnormal; Notable for the following components:   Alcohol, Ethyl (B) 116 (*)    All other components within normal limits  CBC WITH DIFFERENTIAL/PLATELET  PROTIME-INR   ____________________________________________   RADIOLOGY Ursula Alert, personally viewed and evaluated these images (plain radiographs) as part of my medical decision making, as well as reviewing the written report by the radiologist.  ED MD interpretation: Negative left shoulder films.  Negative CT head and cervical spine CT scans.  2 rib fractures on the left side of her chest.  Official radiology report(s): DG Ribs Unilateral W/Chest Left  Result Date: 05/23/2021 CLINICAL DATA:  Recent fall with left-sided chest pain, initial encounter EXAM: LEFT RIBS AND CHEST - 3+ VIEW COMPARISON:  None. FINDINGS: Cardiac shadow is within normal limits. Lungs are well aerated bilaterally. No focal infiltrate or sizable pneumothorax is seen. Mildly angulated fracture of the left third rib is noted laterally. Similar findings are noted in the fourth and fifth ribs laterally. No other focal abnormality is  noted. IMPRESSION: Fractures of the left third through fifth ribs without complicating factors. Electronically Signed   By: Inez Catalina M.D.   On: 05/23/2021 03:09   CT Head Wo Contrast  Result Date: 05/23/2021 CLINICAL DATA:  Unwitnessed fall injury. EXAM: CT HEAD WITHOUT CONTRAST CT CERVICAL SPINE WITHOUT CONTRAST TECHNIQUE: Multidetector CT imaging of the head and cervical spine was performed following the standard protocol without intravenous contrast. Multiplanar CT image reconstructions of the cervical spine were also generated. COMPARISON:  Head CT and cervical spine CT both 10/02/2019. FINDINGS: CT HEAD FINDINGS Brain: There are mild features of atrophy and small vessel disease of the cerebral hemispheres with unremarkable brainstem and cerebellum. The ventricles are normal in size and position. No asymmetry is seen concerning for an acute infarct, hemorrhage or mass Vascular: There calcifications of the carotid siphons but no hyperdense central vessels Skull: There is a left frontal scalp hematoma extending to the brow area. The calvarium and skull base are intact. Sinuses/Orbits: No acute finding. Other: None. CT CERVICAL SPINE FINDINGS Alignment: There is a 2-3 mm unchanged anterolisthesis at C3-4 and C4-5, likely degenerative etiology with no posttraumatic listhesis or other alignment abnormality. Skull base and vertebrae: There is osteopenia. Prominent anterior osteophytes C3-4, C4-5, C5-6 and C6-7, also C7-T1. There is no evidence of fractures. Posterior osteophytes are also seen at C5-6 and C6-7. there is narrowing and spurring of the anterior atlantodental joint. Soft tissues and spinal canal: There is calcification of carotid bifurcations. There were no acute paravertebral soft tissue findings or masses common the no precervical soft tissue thickening is seen. There is mild to moderate encroachment on the spinal canal and cervical cord at C5-6 C6-7 due to posterior disc osteophyte complexes,  broad-based at C5-6 eccentric to the left at C6-7. Other levels do not show significant soft tissue or bony encroachment on the thecal sac. Disc levels: There is mild disc space loss at C4-5, moderate disc space loss C5-6 C6-7, mild disc space loss C7-T1. As above, posterior disc osteophyte complexes moderately encroach on the thecal sac and cord at C5-6 and C6-7. Facet joint and uncinate spurs are present at multiple levels, with acquired foraminal stenosis which is  mild-to-moderate on the left at C2-3, moderate to severe on the left at C3-4, severe on the right and mild on the left at C5-6, bilaterally mild at C6-7. Upper chest: Negative. Other: None. IMPRESSION: 1. Left frontal scalp hematoma with no acute intracranial CT findings, depressed skull fractures, or other changes from the prior head CT. 2. Osteopenia and degenerative change of the cervical spine without evidence of fractures or interval changes. Electronically Signed   By: Telford Nab M.D.   On: 05/23/2021 00:36   CT Chest Wo Contrast  Result Date: 05/23/2021 CLINICAL DATA:  Fall, bilateral rib pain EXAM: CT CHEST WITHOUT CONTRAST TECHNIQUE: Multidetector CT imaging of the chest was performed following the standard protocol without IV contrast. COMPARISON:  05/09/2017 FINDINGS: Cardiovascular: Mild coronary artery calcification. Global cardiac size within normal limits. No pericardial effusion. The central pulmonary arteries are of normal caliber. Mild atherosclerotic calcification within the thoracic aorta. No aortic aneurysm. Mediastinum/Nodes: No enlarged mediastinal or axillary lymph nodes. Thyroid gland, trachea, and esophagus demonstrate no significant findings. Small hiatal hernia. Lungs/Pleura: Clustered pulmonary nodules within the subpleural right middle lobe, axial image # 65/3, are stable since remote prior examination and are safely considered benign. Similarly, multiple pulmonary nodules within the peripheral left lower lobe and  axial image # 88 and 95 are also stable. Minimal bibasilar atelectasis. The lungs are otherwise clear. No pneumothorax or pleural effusion. Upper Abdomen: Surgical changes of Roux-en-Y gastric bypass are identified. No acute abnormality. Musculoskeletal: Acute fractures of the left third and fourth ribs are seen anterolaterally. No other acute bone abnormality. Osseous structures are age-appropriate. IMPRESSION: Acute fractures of the left third and fourth ribs anterolaterally. No pneumothorax. Mild coronary artery calcification. 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 Numerous tiny pulmonary nodules, stable since prior examination, safely considered benign. No suspicious or indeterminate pulmonary nodules identified. Aortic Atherosclerosis (ICD10-I70.0). Electronically Signed   By: Fidela Salisbury M.D.   On: 05/23/2021 04:17   CT Cervical Spine Wo Contrast  Result Date: 05/23/2021 CLINICAL DATA:  Unwitnessed fall injury. EXAM: CT HEAD WITHOUT CONTRAST CT CERVICAL SPINE WITHOUT CONTRAST TECHNIQUE: Multidetector CT imaging of the head and cervical spine was performed following the standard protocol without intravenous contrast. Multiplanar CT image reconstructions of the cervical spine were also generated. COMPARISON:  Head CT and cervical spine CT both 10/02/2019. FINDINGS: CT HEAD FINDINGS Brain: There are mild features of atrophy and small vessel disease of the cerebral hemispheres with unremarkable brainstem and cerebellum. The ventricles are normal in size and position. No asymmetry is seen concerning for an acute infarct, hemorrhage or mass Vascular: There calcifications of the carotid siphons but no hyperdense central vessels Skull: There is a left frontal scalp hematoma extending to the brow area. The calvarium and skull base are intact. Sinuses/Orbits: No acute finding. Other: None. CT CERVICAL SPINE FINDINGS Alignment: There is a 2-3 mm unchanged  anterolisthesis at C3-4 and C4-5, likely degenerative etiology with no posttraumatic listhesis or other alignment abnormality. Skull base and vertebrae: There is osteopenia. Prominent anterior osteophytes C3-4, C4-5, C5-6 and C6-7, also C7-T1. There is no evidence of fractures. Posterior osteophytes are also seen at C5-6 and C6-7. there is narrowing and spurring of the anterior atlantodental joint. Soft tissues and spinal canal: There is calcification of carotid bifurcations. There were no acute paravertebral soft tissue findings or masses common the no precervical soft tissue thickening is seen. There is mild to moderate encroachment on the spinal canal  and cervical cord at C5-6 C6-7 due to posterior disc osteophyte complexes, broad-based at C5-6 eccentric to the left at C6-7. Other levels do not show significant soft tissue or bony encroachment on the thecal sac. Disc levels: There is mild disc space loss at C4-5, moderate disc space loss C5-6 C6-7, mild disc space loss C7-T1. As above, posterior disc osteophyte complexes moderately encroach on the thecal sac and cord at C5-6 and C6-7. Facet joint and uncinate spurs are present at multiple levels, with acquired foraminal stenosis which is mild-to-moderate on the left at C2-3, moderate to severe on the left at C3-4, severe on the right and mild on the left at C5-6, bilaterally mild at C6-7. Upper chest: Negative. Other: None. IMPRESSION: 1. Left frontal scalp hematoma with no acute intracranial CT findings, depressed skull fractures, or other changes from the prior head CT. 2. Osteopenia and degenerative change of the cervical spine without evidence of fractures or interval changes. Electronically Signed   By: Telford Nab M.D.   On: 05/23/2021 00:36   DG Shoulder Left  Result Date: 05/22/2021 CLINICAL DATA:  Left shoulder pain after fall down flight of stairs. EXAM: LEFT SHOULDER - 2+ VIEW COMPARISON:  None. FINDINGS: There is no evidence of fracture or  dislocation. There is no evidence of arthropathy or other focal bone abnormality. No fracture of included left ribs. Soft tissues are unremarkable. IMPRESSION: Negative radiographs of the left shoulder. Electronically Signed   By: Keith Rake M.D.   On: 05/22/2021 23:56    ____________________________________________   PROCEDURES   Procedure(s) performed (including Critical Care):  .1-3 Lead EKG Interpretation Performed by: Hinda Kehr, MD Authorized by: Hinda Kehr, MD     Interpretation: normal     ECG rate:  95   ECG rate assessment: normal     Rhythm: sinus rhythm     Ectopy: none     Conduction: normal   ..Laceration Repair  Date/Time: 05/23/2021 4:52 AM Performed by: Hinda Kehr, MD Authorized by: Hinda Kehr, MD   Consent:    Consent obtained:  Verbal   Consent given by:  Patient   Risks discussed:  Infection, pain, retained foreign body, poor cosmetic result and poor wound healing Universal protocol:    Patient identity confirmed:  Verbally with patient and arm band Laceration details:    Location:  Face   Face location:  Forehead (left above eyebrow)   Length (cm):  1.5 Exploration:    Hemostasis achieved with:  Direct pressure   Contaminated: no   Treatment:    Area cleansed with:  Saline   Amount of cleaning:  Extensive   Irrigation solution:  Sterile saline   Visualized foreign bodies/material removed: no   Skin repair:    Repair method:  Tissue adhesive and Steri-Strips   Number of Steri-Strips:  2 Approximation:    Approximation:  Close Repair type:    Repair type:  Simple Post-procedure details:    Dressing:  Open (no dressing)   Procedure completion:  Tolerated well, no immediate complications   ____________________________________________   INITIAL IMPRESSION / MDM / ASSESSMENT AND PLAN / ED COURSE  As part of my medical decision making, I reviewed the following data within the electronic MEDICAL RECORD NUMBER History obtained from  family, Nursing notes reviewed and incorporated, Labs reviewed , EKG interpreted , Old chart reviewed, Radiograph reviewed , and Notes from prior ED visits   Differential diagnosis includes, but is not limited to, alcohol intoxication, trauma, head injury,  cervical spine injury, thoracic or abdominal trauma, fracture or dislocation, metabolic or electrolyte abnormality.  The patient is on the cardiac monitor to evaluate for evidence of arrhythmia and/or significant heart rate changes.  No neck pain or headache at this time but with obvious head trauma on the front and back of her head.  Patient is intoxicated and NEXUS criteria do not apply.  Ordering CT head and CT cervical spine as well as x-rays of her left shoulder although I have a low suspicion for left shoulder fracture or dislocation based on her physical exam.  Vital signs are stable and within normal limits.  No indication for emergent medication at this time.  Tdap up-to-date.  No active extravasation from her lacerations.  Will obtain imaging and reassess.  Basic blood work pending as well.  Patient and husband, who is at bedside, understand and agree with the plan.     Clinical Course as of 05/23/21 0455  Tue May 23, 2021  0453 (Delayed documentation due to multiple critically ill patients in the ED at once).  Patient developed pain in the left side of her chest wall.  I obtained radiographs of the ribs.  I personally reviewed the images of her left shoulder and her left ribs and chest x-ray, and I agree with the radiologist that the left shoulder films are negative for fracture or dislocation but there is some evidence of rib fractures on the left side of her ribs.  Given the concern that there may be occult injuries not seen on the chest x-ray, I obtained a CT chest without contrast and confirmed the presence of only 2 rib fractures.  Patient has been stable throughout almost 6 hours in the ED with no changes in vital signs and she is  clinically sober.  Alcohol level is only slightly elevated. [CF]  4765 Her comprehensive metabolic panel, coags, and CBC are all within normal limits.  Successfully repaired her left forehead laceration without the need for sutures.  Talked with patient and husband about incentive spirometer, pain control, and outpatient follow-up.  They understand and agree with the plan.  No indication of an emergent medical condition requiring further work-up or admission. [CF]    Clinical Course User Index [CF] Hinda Kehr, MD     ____________________________________________  FINAL CLINICAL IMPRESSION(S) / ED DIAGNOSES  Final diagnoses:  Fall, initial encounter  Injury of head, initial encounter  Laceration of forehead, initial encounter  Contusion of scalp, initial encounter  Closed fracture of multiple ribs of left side, initial encounter  Alcoholic intoxication with complication ([place])     MEDICATIONS GIVEN DURING THIS VISIT:  Medications  lidocaine (PF) (XYLOCAINE) 1 % injection 5 mL (5 mLs Other Not Given 05/23/21 0454)  oxyCODONE-acetaminophen (PERCOCET/ROXICET) 5-325 MG per tablet 2 tablet (2 tablets Oral Given 05/23/21 0454)     ED Discharge Orders          Ordered    oxyCODONE-acetaminophen (PERCOCET) 5-325 MG tablet  Every 6 hours PRN        05/23/21 0450    docusate sodium (COLACE) 100 MG capsule        05/23/21 0450             Note:  This document was prepared using Dragon voice recognition software and may include unintentional dictation errors.   Hinda Kehr, MD 05/23/21 [phone]

## 2021-05-23 ENCOUNTER — Ambulatory Visit: Payer: BC Managed Care – PPO | Admitting: Occupational Therapy

## 2021-05-23 ENCOUNTER — Emergency Department: Payer: BC Managed Care – PPO

## 2021-05-23 DIAGNOSIS — M25641 Stiffness of right hand, not elsewhere classified: Secondary | ICD-10-CM

## 2021-05-23 DIAGNOSIS — M79641 Pain in right hand: Secondary | ICD-10-CM

## 2021-05-23 DIAGNOSIS — L905 Scar conditions and fibrosis of skin: Secondary | ICD-10-CM

## 2021-05-23 DIAGNOSIS — M6281 Muscle weakness (generalized): Secondary | ICD-10-CM

## 2021-05-23 LAB — CBC WITH DIFFERENTIAL/PLATELET
Abs Immature Granulocytes: 0.03 10*3/uL (ref 0.00–0.07)
Basophils Absolute: 0 10*3/uL (ref 0.0–0.1)
Basophils Relative: 0 %
Eosinophils Absolute: 0.1 10*3/uL (ref 0.0–0.5)
Eosinophils Relative: 1 %
HCT: 37 % (ref 36.0–46.0)
Hemoglobin: 12.6 g/dL (ref 12.0–15.0)
Immature Granulocytes: 0 %
Lymphocytes Relative: 15 %
Lymphs Abs: 1.3 10*3/uL (ref 0.7–4.0)
MCH: 32.6 pg (ref 26.0–34.0)
MCHC: 34.1 g/dL (ref 30.0–36.0)
MCV: 95.6 fL (ref 80.0–100.0)
Monocytes Absolute: 0.6 10*3/uL (ref 0.1–1.0)
Monocytes Relative: 7 %
Neutro Abs: 6.3 10*3/uL (ref 1.7–7.7)
Neutrophils Relative %: 77 %
Platelets: 223 10*3/uL (ref 150–400)
RBC: 3.87 MIL/uL (ref 3.87–5.11)
RDW: 13 % (ref 11.5–15.5)
WBC: 8.4 10*3/uL (ref 4.0–10.5)
nRBC: 0 % (ref 0.0–0.2)

## 2021-05-23 LAB — COMPREHENSIVE METABOLIC PANEL
ALT: 20 U/L (ref 0–44)
AST: 28 U/L (ref 15–41)
Albumin: 3.9 g/dL (ref 3.5–5.0)
Alkaline Phosphatase: 69 U/L (ref 38–126)
Anion gap: 9 (ref 5–15)
BUN: 20 mg/dL (ref 8–23)
CO2: 25 mmol/L (ref 22–32)
Calcium: 8.5 mg/dL — ABNORMAL LOW (ref 8.9–10.3)
Chloride: 102 mmol/L (ref 98–111)
Creatinine, Ser: 0.6 mg/dL (ref 0.44–1.00)
GFR, Estimated: >60 mL/min (ref >60)
Glucose, Bld: 109 mg/dL — ABNORMAL HIGH (ref 70–99)
Potassium: 4.1 mmol/L (ref 3.5–5.1)
Sodium: 136 mmol/L (ref 135–145)
Total Bilirubin: 0.4 mg/dL (ref 0.3–1.2)
Total Protein: 6.9 g/dL (ref 6.5–8.1)

## 2021-05-23 LAB — ETHANOL: Alcohol, Ethyl (B): 116 mg/dL — ABNORMAL HIGH (ref <10)

## 2021-05-23 LAB — PROTIME-INR
INR: 1 (ref 0.8–1.2)
Prothrombin Time: 12.8 seconds (ref 11.4–15.2)

## 2021-05-23 MED ORDER — OXYCODONE-ACETAMINOPHEN 5-325 MG PO TABS
2.0000 | ORAL_TABLET | Freq: Once | ORAL | Status: AC
  Administered 2021-05-23: 2 via ORAL
  Filled 2021-05-23: qty 2

## 2021-05-23 MED ORDER — OXYCODONE-ACETAMINOPHEN 5-325 MG PO TABS: 2.0000 | ORAL_TABLET | Freq: Four times a day (QID) | ORAL | 0 refills | Status: DC | PRN

## 2021-05-23 MED ORDER — DOCUSATE SODIUM 100 MG PO CAPS: ORAL_CAPSULE | ORAL | 0 refills | Status: DC

## 2021-05-23 MED ORDER — LIDOCAINE HCL (PF) 1 % IJ SOLN
5.0000 mL | Freq: Once | INTRAMUSCULAR | Status: DC
  Filled 2021-05-23: qty 5

## 2021-05-23 NOTE — Discharge Instructions (Addendum)
Your workup today showed that you have a fracture to one or more ribs.  Unfortunately this type of injury hurts but there is no way to fix it immediately; it must heal over time.  Be sure to take plenty of deep breaths so that you get rid of the "bad air" in your lungs.  If you are given a device called an incentive spirometer, please use it as recommended.  Unless you have been told by your doctor not to do so, we recommend you take ibuprofen 600 mg 3 times daily with meals for no more than 5 days.  You can also take Tylenol 1000 mg every 6 hours for pain.  Take Percocet as prescribed for severe pain. Do not drink alcohol, drive or participate in any other potentially dangerous activities while taking this medication as it may make you sleepy. Do not take this medication with any other sedating medications, either prescription or over-the-counter. If you were prescribed Percocet or Vicodin, do not take these with acetaminophen (Tylenol) as it is already contained within these medications.   This medication is an opiate (or narcotic) pain medication and can be habit forming.  Use it as little as possible to achieve adequate pain control.  Do not use or use it with extreme caution if you have a history of opiate abuse or dependence.  If you are on a pain contract with your primary care doctor or a pain specialist, be sure to let them know you were prescribed this medication today from the [facility] Emergency Department.  This medication is intended for your use only - do not give any to anyone else and keep it in a secure place where nobody else, especially children, have access to it.  It will also cause or worsen constipation, so you may want to consider taking an over-the-counter stool softener while you are taking this medication.  Allow of the skin adhesive and Steri-Strips on your left forehead laceration to follow-up on their own, probably in 1 to 2 weeks.  If the edges began to [place], you can  trim them with scissors, but please do not try to pull off the tape or it may reopen your wound.  Follow-up at the clinics or with the doctors described in this paperwork.  Return to the emergency department if he develop new or worsening symptoms that concern you.

## 2021-05-23 NOTE — Therapy (Signed)
[facility] [address], [place], [address] Phone: [phone]   Fax:  [phone]  Occupational Therapy Treatment  Patient Details  Name: Gabrielle Malone MRN: [id number] Date of Birth: 09-03-1955 Referring Provider (OT): Dr Roland Rack   Encounter Date: 05/23/2021   OT End of Session - 05/23/21 1353     Visit Number 10    Number of Visits 18    Date for OT Re-Evaluation 06/08/21    OT Start Time 2836    OT Stop Time 1345    OT Time Calculation (min) 30 min    Activity Tolerance Patient tolerated treatment well    Behavior During Therapy [facility] for tasks assessed/performed             Past Medical History:  Diagnosis Date   Alcohol abuse    Anxiety    Complication of anesthesia    Remembers part of surgery from rhinoplasty and woke up during another surgery   Depression    Endometriosis    GERD (gastroesophageal reflux disease)    Gestational diabetes    Insomnia     Past Surgical History:  Procedure Laterality Date   carpal tunnel     CESAREAN SECTION     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   Procedure: DUPUYTREN CONTRACTURE RELEASE RIGHT HAND AND RELEASE OF RIGHT RING TRIGGER FINGER;  Surgeon: Corky Mull, MD;  Location: [facility] ORS;  Service: Orthopedics;  Laterality: Right;   GASTRIC BYPASS     OOPHORECTOMY     RHINOPLASTY     TRIGGER FINGER RELEASE Right 10/13/2020   Procedure: RIGHT MIDDLE FINGER 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;  Surgeon: Corky Mull, MD;  Location: [facility] ORS;  Service: Orthopedics;  Laterality: Right;    There were no vitals filed for this visit.   Subjective Assessment - 05/23/21 1352     Subjective  I fell down the stairs- was since midnight in ER - and came home around 5am - fractured 2-3 ribs, my shoulder hurts and then my hand feels swollen and sore - wnated you to look at my hands    Pertinent History The patient is a  65 year old female who is now nearly 6 months status post a debridement of a Dupuytren's contracture of her right palm with a right long trigger finger release. Despite extensive OT and home exercises, the patient continues to have difficulty with long finger range of motion. On examination, he has thickened scarring along the palmar incision which may be interfering with her ability to flex her finger. In addition, she now notes a recurrent painful catching of her right ring finger, consistent with a right ring trigger finger. The patient presents at this time for release of the recurrent Dupuytren's contracture of the right palm as well as for release of a right ring trigger finger.   03/28/21- and now refer to OT to start in 1-3 days    Patient Stated Goals To be able to get full motion in my R hand  so I can use it normally and pain/tenderness better    Currently in Pain? Yes    Pain Score 3     Pain Location Hand    Pain Orientation Left    Pain Descriptors / Indicators Tightness;Tender    Pain Type Acute pain    Pain Onset Today    Pain Frequency Intermittent  Pt report she fell down stairs last night and was 5-6hrs in ED - 2 rib fractures on the L - shoulder bruise and open area on R 3rd MC  and edema  Some soreness on L 3rd MC with fisting          OT Treatments/Exercises (OP) - 05/23/21 0001       RUE Contrast Bath   Time 8 minutes    Comments prior to review of AROM - decrease stiffness      LUE Contrast Bath   Time 8 minutes    Comments decrease pain in 3rd MC to 1/10             Daughter present - pt to cont after next few days her  scar massage  Review again importance on doing scar mobs , stretches for extention and use of CICA scar pad night time when she feels better Rolling over red roller for extention for digits 20 reps  Prayer stretch - and table slides 20 reps- stretch in palm when feeling better      R digits PROM composite  flexion- including DIP - pain free 10 reps And then composite fist pain free- - PLACE and HOLD 10 reps And REINFORCE COMPOSITE extention in between - can do PROM  But when feeling better  Opposition to all digits AROM 10  Several times during day  Pt TO NOT USE  paraffin at home while she has open area -  Can use heating and cold pack for contrast            OT Education - 05/23/21 1353     Education Details HEP    Person(s) Educated Patient;Child(ren)    Methods Explanation;Demonstration;Tactile cues;Verbal cues;Handout    Comprehension Verbal cues required;Returned demonstration;Verbalized understanding              OT Short Term Goals - 03/30/21 1509       OT SHORT TERM GOAL #1   Title Pt to be independeint in HEP to decrease scar tissue, edema and pain to touch palm with all digits    Baseline pain 3-5  and stitches in place - Z scar  surgery was 3 days ago - Flexion of MC's 75-85, PIP's 70-80 - decrease extention - at [facility]    Time 3    Period Weeks    Status New    Target Date 04/20/21               OT Long Term Goals - 03/30/21 1511       OT LONG TERM GOAL #1   Title Pt to make composite fist with R hand touching palm without increase symptoms    Baseline pain and scar tissue 3-5/10 -Z scar in palm - incision in place - MC's 75-85 and PIP's 70-80 - decrease extention of MC's    Time 5    Period Weeks    Status New    Target Date 05/04/21      OT LONG TERM GOAL #2   Title R grip and prehension strength increase to WNL compare to L to carry groceries, kayak and use tools without symptoms    Baseline NT - pt 3 days out of surgery    Time 10    Period Weeks    Status New    Target Date 06/08/21      OT LONG TERM GOAL #3   Title R hand digits extention increase to WNL to put  hand in pocket and no issue typing on computer    Baseline incision in palm and stitches in place - decrease MC extention - and pull and pain - not able to lay hand flat -  need reminder to extend digits during tendon glides    Time 4    Period Weeks    Status New    Target Date 04/27/21                   Plan - 05/23/21 1354     Clinical Impression Statement Pt and daugthr this date report she fell last night and was in the ER the whole night- wanted me to check on her hands -3rd MC swollen and open scab from fallling - but no pain in R hand only stiffness -and AROM same as last time- L 3rd MC some soreness with composite fist 3/10 -but after some contrast had less pain - pt to only do contrast and gentle tendon glides and PROM for flexion to R hand - reinforce NOT to do paraffin on R hand - until area is closed - Daughter with pt - report she bruised shoulder and has 2-3 fracture ribs- pt seen and refer to OT and seen for 10 visits  for post a debridement of a Dupuytren's contracture of her right palm with a right long trigger finger release. She had dupuytrens scar release and 4th digit trigger finger released on 03/30/21 - Order by Dr Roland Rack to start immediately ROM -NOW pt is 6 wks s/p had infection about 4 wks ago, done that caused some delayed in healing of incision -  able to do paraffin since 2 wksago and focus on scar massage and extention stretches more - Responded great on soft tissue mobs in last week after paraffin and extention stretches and PROM for composite flexion - pt to cont with scar  massage and PROM and place and hold - for flexion and extention now  - pt limited moslty by stiffness more than edema and pain in R dominant - limiting her functional use - pt can benefit from skilled OT services.    OT Occupational Profile and History Problem Focused Assessment - Including review of records relating to presenting problem    Occupational performance deficits (Please refer to evaluation for details): ADL's;IADL's;Work;Play;Leisure;Social Participation    Body Structure / Function / Physical Skills ADL;Strength;Pain;Edema;UE functional use;ROM;Scar  mobility;Flexibility;FMC;Decreased knowledge of precautions;Skin integrity    Rehab Potential Good    Clinical Decision Making Limited treatment options, no task modification necessary    Comorbidities Affecting Occupational Performance: None    Modification or Assistance to Complete Evaluation  No modification of tasks or assist necessary to complete eval    OT Frequency 1x / week    OT Duration 8 weeks    OT Treatment/Interventions Self-care/ADL training;Splinting;Manual Therapy;Paraffin;Passive range of motion;Scar mobilization;Therapeutic exercise;Contrast Bath;Fluidtherapy;Cryotherapy;Patient/family education    Consulted and Agree with Plan of Care Patient             Patient will benefit from skilled therapeutic intervention in order to improve the following deficits and impairments:   Body Structure / Function / Physical Skills: ADL, Strength, Pain, Edema, UE functional use, ROM, Scar mobility, Flexibility, FMC, Decreased knowledge of precautions, Skin integrity       Visit Diagnosis: Muscle weakness (generalized)  Stiffness of right hand, not elsewhere classified  Pain in right hand  Scar condition and fibrosis of skin    Problem List Patient Active Problem  List   Diagnosis Date Noted   Anxiety 04/23/2017   Depression 04/23/2017   Gestational diabetes 04/23/2017   Endometriosis 04/23/2017   Menstrual problem 04/23/2017   Status post normal childbirth 04/23/2017   Physical exam, organ donor 02/05/2013   Pre-op evaluation 02/05/2013   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    Rosalyn Gess, OTR/L,CLT 05/23/2021, 3:07 PM  [facility] [address] [place], [address] Phone: [phone]   Fax:  [phone]  Name: Gabrielle Malone MRN: [id number] Date of Birth: 02-Mar-1956

## 2021-05-25 ENCOUNTER — Emergency Department
Admission: EM | Admit: 2021-05-25 | Discharge: 2021-05-26 | Disposition: A | Discharge status: Home / Self Care | Payer: BC Managed Care – PPO | Attending: Emergency Medicine | Admitting: Emergency Medicine

## 2021-05-25 ENCOUNTER — Other Ambulatory Visit: Payer: Self-pay

## 2021-05-25 DIAGNOSIS — S5012XD Contusion of left forearm, subsequent encounter: Secondary | ICD-10-CM | POA: Insufficient documentation

## 2021-05-25 DIAGNOSIS — S2242XD Multiple fractures of ribs, left side, subsequent encounter for fracture with routine healing: Secondary | ICD-10-CM | POA: Insufficient documentation

## 2021-05-25 DIAGNOSIS — R600 Localized edema: Secondary | ICD-10-CM | POA: Diagnosis not present

## 2021-05-25 DIAGNOSIS — S299XXD Unspecified injury of thorax, subsequent encounter: Secondary | ICD-10-CM | POA: Diagnosis present

## 2021-05-25 DIAGNOSIS — S0012XA Contusion of left eyelid and periocular area, initial encounter: Secondary | ICD-10-CM

## 2021-05-25 DIAGNOSIS — H538 Other visual disturbances: Secondary | ICD-10-CM | POA: Insufficient documentation

## 2021-05-25 DIAGNOSIS — W19XXXD Unspecified fall, subsequent encounter: Secondary | ICD-10-CM | POA: Diagnosis not present

## 2021-05-25 DIAGNOSIS — Y92009 Unspecified place in unspecified non-institutional (private) residence as the place of occurrence of the external cause: Secondary | ICD-10-CM | POA: Insufficient documentation

## 2021-05-25 DIAGNOSIS — R609 Edema, unspecified: Secondary | ICD-10-CM

## 2021-05-25 DIAGNOSIS — S40012D Contusion of left shoulder, subsequent encounter: Secondary | ICD-10-CM | POA: Diagnosis not present

## 2021-05-25 MED ORDER — LIDOCAINE 5 % EX PTCH: 1.0000 | MEDICATED_PATCH | Freq: Two times a day (BID) | CUTANEOUS | 0 refills | Status: AC | PRN

## 2021-05-25 NOTE — ED Provider Notes (Signed)
[facility] Emergency Department Provider Note ____________________________________________  Time seen: 1530  I have reviewed the triage vital signs and the nursing notes.  HISTORY  Chief Complaint  Fall   HPI Gabrielle Malone is a 65 y.o. female returns to the ED for evaluation of some extensive bruising subsequent to her mechanical fall few days prior.  Patient was evaluated in the ED with reassuring overall work-up including head CT, showed exam, chest CT.  She was found to have no acute fractures with exception of some left-sided rib fractures.  She has been managing at home and sparingly dosing the pain medicine.  She denies any headache, nausea, vomiting, dizziness.  She does endorse some increased disability with right shoulder range of motion, but denies any reinjury by fall.  She was only concerned because she developed some swelling and fullness to the lid of the left eye, and some distal hand soft tissue swelling and bruising.  She denies any ongoing cough, congestion, chest pain, shortness of breath.  Past Medical History:  Diagnosis Date   Alcohol abuse    Anxiety    Complication of anesthesia    Remembers part of surgery from rhinoplasty and woke up during another surgery   Depression    Endometriosis    GERD (gastroesophageal reflux disease)    Gestational diabetes    Insomnia     Patient Active Problem List   Diagnosis Date Noted   Anxiety 04/23/2017   Depression 04/23/2017   Gestational diabetes 04/23/2017   Endometriosis 04/23/2017   Menstrual problem 04/23/2017   Status post normal childbirth 04/23/2017   Physical exam, organ donor 02/05/2013   Pre-op evaluation 02/05/2013   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    Past Surgical History:  Procedure Laterality Date   carpal tunnel     CESAREAN SECTION     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   Procedure: DUPUYTREN CONTRACTURE RELEASE RIGHT HAND AND RELEASE OF RIGHT RING TRIGGER  FINGER;  Surgeon: Corky Mull, MD;  Location: [facility] ORS;  Service: Orthopedics;  Laterality: Right;   GASTRIC BYPASS     OOPHORECTOMY     RHINOPLASTY     TRIGGER FINGER RELEASE Right 10/13/2020   Procedure: RIGHT MIDDLE FINGER 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 Authorizing Provider  amphetamine-dextroamphetamine (ADDERALL) 10 MG tablet Take 10 mg by mouth daily with breakfast. Can take up to twice daily 09/14/20   [provider]  cyanocobalamin (,VITAMIN B-12,) 1000 MCG/ML injection Inject 1,000 mcg into the muscle every 30 (thirty) days.    [provider]  docusate sodium (COLACE) 100 MG capsule Take 1 tablet once or twice daily as needed for constipation while taking narcotic pain medicine 05/23/21   Hinda Kehr, MD  gabapentin (NEURONTIN) 300 MG capsule Take 150-300 mg by mouth as needed.    [provider]  omeprazole (PRILOSEC) 40 MG capsule Take 40 mg by mouth daily as needed (acid reflux). 04/20/17   [provider]  oxyCODONE-acetaminophen (PERCOCET) 5-325 MG tablet Take 2 tablets by mouth every 6 (six) hours as needed for severe pain. 05/23/21   Hinda Kehr, MD  propranolol (INDERAL) 20 MG tablet Take 20 mg by mouth every morning.    [provider]  sertraline (ZOLOFT) 100 MG tablet Take 100 mg by mouth  every morning.    [provider]  traMADol (ULTRAM) 50 MG tablet Take 1 tablet (50 mg total) by mouth every 6 (six) hours as needed for moderate pain. 03/28/21 03/28/22  Poggi, Marshall Cork, MD  zolpidem (AMBIEN) 10 MG tablet Take 10 mg by mouth at bedtime. 04/08/17   [provider]    Allergies Patient has no known allergies.  Family History  Problem Relation Age of Onset   Dementia Mother    Cancer Father         lung,,melanoma   Dementia Maternal Grandmother    Breast cancer Maternal Grandmother     Social History Social History   Tobacco Use   Smoking status: Never   Smokeless tobacco: Never  Vaping Use   Vaping Use: Never used  Substance Use Topics   Alcohol use: Yes    Comment: nightly wine   Drug use: No    Review of Systems  Constitutional: Negative for fever. Eyes: Negative for visual changes. Left upper lid edema/bruising ENT: Negative for sore throat. Cardiovascular: Negative for chest pain. Respiratory: Negative for shortness of breath. Gastrointestinal: Negative for abdominal pain, vomiting and diarrhea. Genitourinary: Negative for dysuria. Musculoskeletal: Negative for back pain. Left shoulder decreased ROM Skin: Negative for rash. LUE swelling and bruising.  Neurological: Negative for headaches, focal weakness or numbness. ____________________________________________  PHYSICAL EXAM:  VITAL SIGNS: ED Triage Vitals  Enc Vitals Group     BP 05/25/21 1522 118/87     Pulse Rate 05/25/21 1522 71     Resp 05/25/21 1522 18     Temp 05/25/21 1522 98.6 F (37 C)     Temp Source 05/25/21 1522 Oral     SpO2 05/25/21 1522 96 %     Weight 05/25/21 1523 122 lb (55.3 kg)     Height 05/25/21 1523 5\' 1"  (1.549 m)     Head Circumference --      Peak Flow --      Pain Score 05/25/21 1523 10     Pain Loc --      Pain Edu? --      Excl. in [place]? --     Constitutional: Alert and oriented. Well appearing and in no distress. GCS =15 Head: Normocephalic and atraumatic, except for STS to the left brow. Eyes: Conjunctivae are normal. Normal extraocular movements Nose: No congestion/rhinorrhea/epistaxis. Mouth/Throat: Mucous membranes are moist. Neck: Supple. No thyromegaly. Hematological/Lymphatic/Immunological: No cervical lymphadenopathy. Cardiovascular: Normal rate, regular rhythm. Normal distal pulses. Respiratory: Normal respiratory effort. No  wheezes/rales/rhonchi. Gastrointestinal: Soft and nontender. No distention. Musculoskeletal: Shoulder without obvious deformity, dislocation, or effusion.  Patient is noted to have significant ecchymosis over the posterior and anterior left shoulder.  Some decreased range of motion but no deformity or step-off is noted.  Distally the patient has some bruising and ecchymosis to the distal forearm and hand, which likely represents some dependent edema.  Nontender with normal range of motion in all extremities.  Neurologic: Cranial nerves II to XII grossly intact.  Normal gait without ataxia. Normal speech and language. No gross focal neurologic deficits are appreciated. Skin:  Skin is warm, dry and intact. No rash noted. Psychiatric: Mood and affect are normal. Patient exhibits appropriate insight and judgment. ____________________________________________    {LABS (pertinent positives/negatives)  ____________________________________________  {EKG  ____________________________________________   RADIOLOGY Official radiology report(s): No results found. ____________________________________________  PROCEDURES   Procedures ____________________________________________   INITIAL IMPRESSION / ASSESSMENT AND PLAN / ED COURSE  As part of my  medical decision making, I reviewed the following data within the electronic MEDICAL RECORD NUMBER Notes from prior ED visits and [place] Controlled Substance Database  DDX: shoulder pain, hand edema, facial ecchymosis  Patient with subsequent ED evaluation of progressive soft tissue swelling and ecchymosis noted to the left upper lid, the left shoulder and left upper extremity.  Patient with a normal exam without neuromuscular deficit or indication of any distal paresthesias.  Symptoms are consistent with significant soft tissue swelling and contusion from her recent fall.  Patient is otherwise without significant complaint at this time.  She denies any recent or  recurrent injury.  She is reassured by her exam, and is aware that symptoms may persist for several days to weeks.  She be discharged with a prescription for Lidoderm patches, and encouraged to take her previously prescribed oxycodone pain medicine.  She is encouraged to follow-up with primary provider return to the ED if needed.  Alithea Lapage was evaluated in Emergency Department on 05/25/2021 for the symptoms described in the history of present illness. She was evaluated in the context of the global COVID-19 pandemic, which necessitated consideration that the patient might be at risk for infection with the SARS-CoV-2 virus that causes COVID-19. Institutional protocols and algorithms that pertain to the evaluation of patients at risk for COVID-19 are in a state of rapid change based on information released by regulatory bodies including the CDC and federal and state organizations. These policies and algorithms were followed during the patient's care in the ED. ____________________________________________  FINAL CLINICAL IMPRESSION(S) / ED DIAGNOSES  Final diagnoses:  Fall in home, subsequent encounter  Black eye of left side, initial encounter  Dependent edema  Closed fracture of multiple ribs of left side with routine healing, subsequent encounter      Melvenia Needles, PA-C 05/25/21 1547    Nena Polio, MD 05/25/21 [phone]

## 2021-05-25 NOTE — ED Triage Notes (Signed)
Pt states that she was here on Monday night for a fall and was seen here- pt states that she knows she has rib fractures but now her L arm is more swollen and she now has a L black eye- pt was sent over here from [facility]- pt having increased pain in the L shoulder

## 2021-05-25 NOTE — Discharge Instructions (Signed)
Your exam is overall reassuring at this time.  No signs of any new or worsening injury.  You have some progressive soft tissue swelling and bruising secondary to your initial soft tissue injuries.  Apply ice to reduce swelling and use lidocaine patches as prescribed.  Take the prescription pain medicine as directed.  Follow with your primary provider or return to the ED if needed.

## 2021-05-29 ENCOUNTER — Ambulatory Visit: Payer: BC Managed Care – PPO | Admitting: Occupational Therapy

## 2021-05-29 ENCOUNTER — Ambulatory Visit: Admit: 2021-05-29 | Payer: BC Managed Care – PPO | Admitting: Ophthalmology

## 2021-05-29 DIAGNOSIS — M79641 Pain in right hand: Secondary | ICD-10-CM

## 2021-05-29 DIAGNOSIS — M25641 Stiffness of right hand, not elsewhere classified: Secondary | ICD-10-CM

## 2021-05-29 DIAGNOSIS — M6281 Muscle weakness (generalized): Secondary | ICD-10-CM

## 2021-05-29 DIAGNOSIS — L905 Scar conditions and fibrosis of skin: Secondary | ICD-10-CM

## 2021-05-29 SURGERY — PHACOEMULSIFICATION, CATARACT, WITH IOL INSERTION
Anesthesia: Topical | Laterality: Right

## 2021-05-29 NOTE — Therapy (Signed)
[facility] [address], [place], [address] Phone: [phone]   Fax:  [phone]  Occupational Therapy Treatment  Patient Details  Name: Gabrielle Malone MRN: [id number] Date of Birth: January 07, 1956 Referring Provider (OT): Dr Roland Rack   Encounter Date: 05/29/2021   OT End of Session - 05/29/21 1553     Visit Number 11    Number of Visits 18    Date for OT Re-Evaluation 06/08/21    OT Start Time 5465    OT Stop Time 1550    OT Time Calculation (min) 35 min    Activity Tolerance Patient tolerated treatment well    Behavior During Therapy [facility] for tasks assessed/performed             Past Medical History:  Diagnosis Date   Alcohol abuse    Anxiety    Complication of anesthesia    Remembers part of surgery from rhinoplasty and woke up during another surgery   Depression    Endometriosis    GERD (gastroesophageal reflux disease)    Gestational diabetes    Insomnia     Past Surgical History:  Procedure Laterality Date   carpal tunnel     CESAREAN SECTION     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   Procedure: DUPUYTREN CONTRACTURE RELEASE RIGHT HAND AND RELEASE OF RIGHT RING TRIGGER FINGER;  Surgeon: Corky Mull, MD;  Location: [facility] ORS;  Service: Orthopedics;  Laterality: Right;   GASTRIC BYPASS     OOPHORECTOMY     RHINOPLASTY     TRIGGER FINGER RELEASE Right 10/13/2020   Procedure: RIGHT MIDDLE FINGER 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;  Surgeon: Corky Mull, MD;  Location: [facility] ORS;  Service: Orthopedics;  Laterality: Right;    There were no vitals filed for this visit.   Subjective Assessment - 05/29/21 1551     Subjective  I am doing okay - just sore from the fall -but otherwise okay - my hand is just stiff - cannot use paraffin with sore on top of my hand you told me last week    Pertinent History The patient is a 65 year old female who is now  nearly 6 months status post a debridement of a Dupuytren's contracture of her right palm with a right long trigger finger release. Despite extensive OT and home exercises, the patient continues to have difficulty with long finger range of motion. On examination, he has thickened scarring along the palmar incision which may be interfering with her ability to flex her finger. In addition, she now notes a recurrent painful catching of her right ring finger, consistent with a right ring trigger finger. The patient presents at this time for release of the recurrent Dupuytren's contracture of the right palm as well as for release of a right ring trigger finger.   03/28/21- and now refer to OT to start in 1-3 days    Patient Stated Goals To be able to get full motion in my R hand  so I can use it normally and pain/tenderness better    Currently in Pain? Yes    Pain Score 3     Pain Location Hand    Pain Orientation Left    Pain Descriptors / Indicators Tightness;Aching    Pain Type Acute pain;Surgical pain    Pain Onset More than a month ago    Pain Frequency Intermittent  [facility] OT Assessment - 05/29/21 0001       Strength   Right Hand Grip (lbs) 38    Right Hand 3 Point Pinch 10 lbs    Left Hand 3 Point Pinch 10 lbs                      OT Treatments/Exercises (OP) - 05/29/21 0001       RUE Paraffin   Number Minutes Paraffin 8 Minutes    RUE Paraffin Location Hand    Comments palm only and digits - NOT dorsal MC's             scar massage done by OT and mini massager  over cica scar pad on palmar scar  Review again importance on doing scar mobs , stretches for extention and use of CICA scar pad night time  Rolling over red roller for extention for digits 20 reps  Prayer stretch - and table slides 20 reps- stretch in palm      PROM composite flexion- including DIP - pain free 10 reps And then composite fist pain free- - PLACE and HOLD 10 reps And  REINFORCE COMPOSITE extention in between - can do PROM  Opposition to all digits AROM 10  Several times during day  And use paraffin at home  now but only palm and digits - no dorsal MC 's   Recommend build up her grip on cooking utensils for increase grip       OT Education - 05/29/21 1553     Education Details HEP    Person(s) Educated Patient;Child(ren)    Methods Explanation;Demonstration;Tactile cues;Verbal cues;Handout    Comprehension Verbal cues required;Returned demonstration;Verbalized understanding              OT Short Term Goals - 03/30/21 1509       OT SHORT TERM GOAL #1   Title Pt to be independeint in HEP to decrease scar tissue, edema and pain to touch palm with all digits    Baseline pain 3-5  and stitches in place - Z scar  surgery was 3 days ago - Flexion of MC's 75-85, PIP's 70-80 - decrease extention - at [facility]    Time 3    Period Weeks    Status New    Target Date 04/20/21               OT Long Term Goals - 03/30/21 1511       OT LONG TERM GOAL #1   Title Pt to make composite fist with R hand touching palm without increase symptoms    Baseline pain and scar tissue 3-5/10 -Z scar in palm - incision in place - MC's 75-85 and PIP's 70-80 - decrease extention of MC's    Time 5    Period Weeks    Status New    Target Date 05/04/21      OT LONG TERM GOAL #2   Title R grip and prehension strength increase to WNL compare to L to carry groceries, kayak and use tools without symptoms    Baseline NT - pt 3 days out of surgery    Time 10    Period Weeks    Status New    Target Date 06/08/21      OT LONG TERM GOAL #3   Title R hand digits extention increase to WNL to put hand in pocket and no issue typing on computer    Baseline incision in  palm and stitches in place - decrease MC extention - and pull and pain - not able to lay hand flat - need reminder to extend digits during tendon glides    Time 4    Period Weeks    Status New    Target  Date 04/27/21                   Plan - 05/29/21 1553     Clinical Impression Statement Pt and daugthr return this date - 11th visit- pt fell down stairs last week  -77rd MC lwss swollen than last week but still open area with  scab from fallling on dorsal 3rd MC - but no pain in R hand only stiffness with composite flexion - pt cont to be able to do Place and hold to palm.R  Grip increase compare to 3 wks to 38 lbs- increase of 8 lbs - reinforce again not to do paraffin at home for whole hand -only volar scar or palm - and digits - showed increase extention this date -done scar massage over cica scar pad this date. She is s/pa debridement of a Dupuytren's contracture of her right palm with a right long trigger finger release. She had dupuytrens scar release and 4th digit trigger finger released on 03/30/21 - pt to cont with scar  massage and PROM for extention and place and hold  for flexion   - pt limited moslty by stiffness more than edema and pain in R dominant - limiting her functional use - pt can benefit from skilled OT services.    OT Occupational Profile and History Problem Focused Assessment - Including review of records relating to presenting problem    Occupational performance deficits (Please refer to evaluation for details): ADL's;IADL's;Work;Play;Leisure;Social Participation    Body Structure / Function / Physical Skills ADL;Strength;Pain;Edema;UE functional use;ROM;Scar mobility;Flexibility;FMC;Decreased knowledge of precautions;Skin integrity    Rehab Potential Good    Clinical Decision Making Limited treatment options, no task modification necessary    Comorbidities Affecting Occupational Performance: None    Modification or Assistance to Complete Evaluation  No modification of tasks or assist necessary to complete eval    OT Frequency 1x / week    OT Duration 8 weeks    OT Treatment/Interventions Self-care/ADL training;Splinting;Manual Therapy;Paraffin;Passive range of  motion;Scar mobilization;Therapeutic exercise;Contrast Bath;Fluidtherapy;Cryotherapy;Patient/family education    Consulted and Agree with Plan of Care Patient             Patient will benefit from skilled therapeutic intervention in order to improve the following deficits and impairments:   Body Structure / Function / Physical Skills: ADL, Strength, Pain, Edema, UE functional use, ROM, Scar mobility, Flexibility, FMC, Decreased knowledge of precautions, Skin integrity       Visit Diagnosis: Muscle weakness (generalized)  Stiffness of right hand, not elsewhere classified  Pain in right hand  Scar condition and fibrosis of skin    Problem List Patient Active Problem List   Diagnosis Date Noted   Anxiety 04/23/2017   Depression 04/23/2017   Gestational diabetes 04/23/2017   Endometriosis 04/23/2017   Menstrual problem 04/23/2017   Status post normal childbirth 04/23/2017   Physical exam, organ donor 02/05/2013   Pre-op evaluation 02/05/2013   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    Rosalyn Gess, OTR/L,CLT 05/29/2021, 3:58 PM  [facility] [address] [place], [address] Phone: [phone]   Fax:  [phone]  Name: Gabrielle Malone MRN: [id number] Date  of Birth: September 14, 1955

## 2021-05-30 ENCOUNTER — Ambulatory Visit: Payer: BC Managed Care – PPO | Admitting: Occupational Therapy

## 2021-06-08 ENCOUNTER — Ambulatory Visit: Payer: BC Managed Care – PPO | Attending: Surgery | Admitting: Occupational Therapy

## 2021-08-24 ENCOUNTER — Ambulatory Visit: Payer: BC Managed Care – PPO | Attending: Surgery | Admitting: Occupational Therapy

## 2021-08-24 ENCOUNTER — Encounter: Payer: Self-pay | Admitting: Occupational Therapy

## 2021-08-24 ENCOUNTER — Other Ambulatory Visit: Payer: Self-pay

## 2021-08-24 DIAGNOSIS — R278 Other lack of coordination: Secondary | ICD-10-CM | POA: Insufficient documentation

## 2021-08-24 DIAGNOSIS — M25641 Stiffness of right hand, not elsewhere classified: Secondary | ICD-10-CM | POA: Diagnosis present

## 2021-08-24 DIAGNOSIS — M6281 Muscle weakness (generalized): Secondary | ICD-10-CM | POA: Insufficient documentation

## 2021-08-24 DIAGNOSIS — L905 Scar conditions and fibrosis of skin: Secondary | ICD-10-CM | POA: Insufficient documentation

## 2021-08-24 NOTE — Therapy (Signed)
[facility] [address], [place], [address] Phone: [phone]   Fax:  [phone]  Occupational Therapy Evaluation  Patient Details  Name: Gabrielle Malone MRN: [id number] Date of Birth: August 22, 1955 Referring Provider (OT): Dr Roland Rack   Encounter Date: 08/24/2021   OT End of Session - 08/24/21 2033     Visit Number 1    Number of Visits 8    Date for OT Re-Evaluation 10/19/21    OT Start Time 1062    OT Stop Time 1620    OT Time Calculation (min) 49 min    Activity Tolerance Patient tolerated treatment well    Behavior During Therapy [facility] for tasks assessed/performed             Past Medical History:  Diagnosis Date   Alcohol abuse    Anxiety    Complication of anesthesia    Remembers part of surgery from rhinoplasty and woke up during another surgery   Depression    Endometriosis    GERD (gastroesophageal reflux disease)    Gestational diabetes    Insomnia     Past Surgical History:  Procedure Laterality Date   carpal tunnel     CESAREAN SECTION     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   Procedure: DUPUYTREN CONTRACTURE RELEASE RIGHT HAND AND RELEASE OF RIGHT RING TRIGGER FINGER;  Surgeon: Corky Mull, MD;  Location: [facility] ORS;  Service: Orthopedics;  Laterality: Right;   GASTRIC BYPASS     OOPHORECTOMY     RHINOPLASTY     TRIGGER FINGER RELEASE Right 10/13/2020   Procedure: RIGHT MIDDLE FINGER 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;  Surgeon: Corky Mull, MD;  Location: [facility] ORS;  Service: Orthopedics;  Laterality: Right;    There were no vitals filed for this visit.   Subjective Assessment - 08/24/21 2024     Subjective  My R hand is still so tight , cannot make tight fist and curl fingers in, coordination not as good - hard to do buttons, write or doodle, draw and paint, cut food, open jars, do yoga    Pertinent History Gabrielle Malone is a 66 y.o.  female seen by Dr Roland Rack 08/18/21  for follow-up now nearly 5 months status post release of a Dupuytren's contracture involving the right palm as well as release of the right ring trigger finger. Overall, the patient feels that she is doing well. She still notes some intermittent soreness in the hand which she rates at 2/10 on today's visit. However she is not taking any medications for discomfort at this time. She has completed formal occupational therapy, but continues to do exercises on her own at home. She has resumed most of her normal daily activities without difficulty, but continues to note difficulty with certain fine motor skill activities such as painting or handwriting. She denies any reinjury to the hand, and denies any fevers or chills. She also denies any numbness or paresthesias to her fingers. -refer to OT for [facility] , stiffness and weakness    Patient Stated Goals I want my stiffness in hand better, stronger and able to write, doodle, buttons, cut, open jars, packages and carry/do yoga pose    Currently in Pain? No/denies               [facility] OT Assessment - 08/24/21 0001       Assessment   Medical Diagnosis R dupuytrens scar release,, 4th  trigger finger release    Referring Provider (OT) Dr Roland Rack    Onset Date/Surgical Date 03/28/21    Hand Dominance Right    Prior Therapy last year after duputrens and after Trigger finger      Home  Environment   Lives With Alone;Spouse      AROM   Right Wrist Extension 45 Degrees    Right Wrist Flexion 85 Degrees    Left Wrist Extension 70 Degrees    Left Wrist Flexion 90 Degrees      Strength   Right Hand Grip (lbs) 50    Right Hand Lateral Pinch 11 lbs    Right Hand 3 Point Pinch 9 lbs    Left Hand Grip (lbs) 36    Left Hand Lateral Pinch 11 lbs    Left Hand 3 Point Pinch 9 lbs      Right Hand AROM   R Index  MCP 0-90 90 Degrees    R Index PIP 0-100 95 Degrees    R Long  MCP 0-90 100 Degrees   -45   R Long PIP 0-100 90  Degrees    R Ring  MCP 0-90 100 Degrees   -35   R Ring PIP 0-100 90 Degrees    R Little  MCP 0-90 95 Degrees    R Little PIP 0-100 90 Degrees                     Pt to do moist heat or paraffin , table slides/prayer stretch or rolling over putty  20 reps  Pain free  Golf ball palm <> fingers tips  Then palm to 2nd and 3rd , palm 4th and 5th  20 reps  Rotate 2 balls in palm  Pick up 5 small object one at time to palm - out to finger tips one at time - use 4th and 5th as storage  Teal med putty - 5  1 cm objects in putty and retrieve using 2 point pinch of R to retrieve   1 x day        OT Education - 08/24/21 2033     Education Details Findings of eval and HEP    Person(s) Educated Patient    Methods Explanation;Demonstration;Tactile cues;Verbal cues;Handout    Comprehension Verbal cues required;Returned demonstration;Verbalized understanding              OT Short Term Goals - 08/24/21 2048       OT SHORT TERM GOAL #1   Title Pt to be independeint in HEP to decrease scar tissue, stiffness and increase extenton of digits and wrist to be able to do weight bearing for yoga    Baseline Wrist ext 45 and -35 to -45 for 3rd and 4th MC of digits- scar tissue thick    Time 4    Period Weeks    Status New    Target Date 09/21/21               OT Long Term Goals - 08/24/21 2050       OT LONG TERM GOAL #1   Title Pt to make composite fist with R hand touching palm without increase symptoms and more ease    Baseline Pt able to touch palm but no DIP flexion of 3rd and 4th -and great effort to touch palm - [facility] 's 95-100 and PIP 's 70 -    Time 4    Period Weeks  Status New    Target Date 09/21/21      OT LONG TERM GOAL #2   Title R prehension strength increase wit 1-2 lbs to do buttons, write and open packages wiht more ease    Baseline 3 point pinch and lat pinch decrease compare to last year on R hand was 14 lat , 10 3point - now 10 and 9 lbs     Time 8    Period Weeks    Status New    Target Date 10/19/21      OT LONG TERM GOAL #3   Title R hand digits FMC improve with more than 2 sec on peg test  for pt to retrieve object out ot palm with ease, doodle ad write with more ease    Baseline 10 peg simulations R 40 sec, L 36 sec - and taking out 10 sec R and L 8 sec- pt is R hand dominant -trouble with [facility]    Time 8    Period Weeks    Status New    Target Date 10/19/21                   Plan - 08/24/21 2034     Clinical Impression Statement Pt present s/p dupuytrens release on R hand - and had on 03/28/21 debridement of a Dupuytren's contracture of her right palm with a right long trigger finger release. Pt refer now 5 months out for continues stiffness ,hand weakness and decrease FMC in ADL's and IADL's. jPt show decrease extention of 3rd and 4th MC , able to touch palm but unable to make composite tight fist with 3rd and 4th - limited by thick scar tissue in palm, Pt do show some arthritic changes in thumb - unable to make opposition pad on pad and thumb ADD during prehension. She do show decrease lat and 3 point pinch and decrease FMC - simulated 10 peg test R 40 sec, L 36 sec and taking out 10 sec R and L 8 sec. Pt is R hand dominant - pt do show decrease opposition with object palm to fingertips , and using ulnar side of hand for storage and 3 point use for release.  Pt limited in use of R dominant - limiting her functional use - pt can benefit from skilled OT services.    OT Occupational Profile and History Problem Focused Assessment - Including review of records relating to presenting problem    Occupational performance deficits (Please refer to evaluation for details): ADL's;IADL's;Work;Play;Leisure;Social Participation    Body Structure / Function / Physical Skills ADL;Strength;UE functional use;ROM;Scar mobility;Flexibility;FMC;Dexterity;IADL;Coordination    Rehab Potential Good    Clinical Decision Making Limited  treatment options, no task modification necessary    Comorbidities Affecting Occupational Performance: None    Modification or Assistance to Complete Evaluation  No modification of tasks or assist necessary to complete eval    OT Frequency 1x / week    OT Duration 8 weeks    OT Treatment/Interventions Self-care/ADL training;Splinting;Manual Therapy;Paraffin;Passive range of motion;Scar mobilization;Therapeutic exercise;Contrast Bath;Fluidtherapy;Patient/family education    Consulted and Agree with Plan of Care Patient             Patient will benefit from skilled therapeutic intervention in order to improve the following deficits and impairments:   Body Structure / Function / Physical Skills: ADL, Strength, UE functional use, ROM, Scar mobility, Flexibility, FMC, Dexterity, IADL, Coordination       Visit Diagnosis: Muscle weakness (generalized) -  Plan: Ot plan of care cert/re-cert  Stiffness of right hand, not elsewhere classified - Plan: Ot plan of care cert/re-cert  Scar condition and fibrosis of skin - Plan: Ot plan of care cert/re-cert  Other lack of coordination - Plan: Ot plan of care cert/re-cert    Problem List Patient Active Problem List   Diagnosis Date Noted   Anxiety 04/23/2017   Depression 04/23/2017   Gestational diabetes 04/23/2017   Endometriosis 04/23/2017   Menstrual problem 04/23/2017   Status post normal childbirth 04/23/2017   Physical exam, organ donor 02/05/2013   Pre-op evaluation 02/05/2013   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    Rosalyn Gess, OTR/L,CLT 08/24/2021, 8:56 PM  [facility] [address] [place], [address] Phone: [phone]   Fax:  [phone]  Name: Gabrielle Malone MRN: [id number] Date of Birth: 12/24/1955

## 2021-08-30 ENCOUNTER — Ambulatory Visit: Payer: BC Managed Care – PPO | Admitting: Occupational Therapy

## 2021-09-11 ENCOUNTER — Ambulatory Visit: Payer: BC Managed Care – PPO | Admitting: Occupational Therapy

## 2021-09-13 ENCOUNTER — Other Ambulatory Visit: Payer: Self-pay

## 2021-09-13 ENCOUNTER — Ambulatory Visit: Payer: BC Managed Care – PPO | Attending: Surgery | Admitting: Occupational Therapy

## 2021-09-13 DIAGNOSIS — M6281 Muscle weakness (generalized): Secondary | ICD-10-CM | POA: Diagnosis present

## 2021-09-13 DIAGNOSIS — M25641 Stiffness of right hand, not elsewhere classified: Secondary | ICD-10-CM | POA: Insufficient documentation

## 2021-09-13 DIAGNOSIS — L905 Scar conditions and fibrosis of skin: Secondary | ICD-10-CM | POA: Diagnosis present

## 2021-09-13 DIAGNOSIS — R278 Other lack of coordination: Secondary | ICD-10-CM | POA: Insufficient documentation

## 2021-09-13 NOTE — Therapy (Signed)
[place] [facility] [address], [place], [address] Phone: [phone]   Fax:  [phone]  Occupational Therapy Treatment  Patient Details  Name: Gabrielle Malone MRN: [id number] Date of Birth: 1956/02/06 Referring Provider (OT): Dr Roland Rack   Encounter Date: 09/13/2021   OT End of Session - 09/13/21 1624     Visit Number 2    Number of Visits 8    Date for OT Re-Evaluation 10/19/21    OT Start Time 6333    OT Stop Time 57    OT Time Calculation (min) 42 min    Activity Tolerance Patient tolerated treatment well    Behavior During Therapy [facility] for tasks assessed/performed             Past Medical History:  Diagnosis Date   Alcohol abuse    Anxiety    Complication of anesthesia    Remembers part of surgery from rhinoplasty and woke up during another surgery   Depression    Endometriosis    GERD (gastroesophageal reflux disease)    Gestational diabetes    Insomnia     Past Surgical History:  Procedure Laterality Date   carpal tunnel     CESAREAN SECTION     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   Procedure: DUPUYTREN CONTRACTURE RELEASE RIGHT HAND AND RELEASE OF RIGHT RING TRIGGER FINGER;  Surgeon: Corky Mull, MD;  Location: [facility] ORS;  Service: Orthopedics;  Laterality: Right;   GASTRIC BYPASS     OOPHORECTOMY     RHINOPLASTY     TRIGGER FINGER RELEASE Right 10/13/2020   Procedure: RIGHT MIDDLE FINGER 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;  Surgeon: Corky Mull, MD;  Location: [facility] ORS;  Service: Orthopedics;  Laterality: Right;    There were no vitals filed for this visit.   Subjective Assessment - 09/13/21 1623     Subjective  Doing better- writing still problem and want my grip stronger and do some yoga - looking into bike for summer - did climb some trees    Pertinent History Gabrielle Malone is a 66 y.o. female seen by Dr Roland Rack 08/18/21  for follow-up now  nearly 5 months status post release of a Dupuytren's contracture involving the right palm as well as release of the right ring trigger finger. Overall, the patient feels that she is doing well. She still notes some intermittent soreness in the hand which she rates at 2/10 on today's visit. However she is not taking any medications for discomfort at this time. She has completed formal occupational therapy, but continues to do exercises on her own at home. She has resumed most of her normal daily activities without difficulty, but continues to note difficulty with certain fine motor skill activities such as painting or handwriting. She denies any reinjury to the hand, and denies any fevers or chills. She also denies any numbness or paresthesias to her fingers. -refer to OT for [facility] , stiffness and weakness    Patient Stated Goals I want my stiffness in hand better, stronger and able to write, doodle, buttons, cut, open jars, packages and carry/do yoga pose    Currently in Pain? No/denies                [facility] OT Assessment - 09/13/21 0001       Strength   Right Hand Grip (lbs) 51    Right Hand Lateral Pinch 14 lbs    Right  Hand 3 Point Pinch 10 lbs    Left Hand Grip (lbs) 41    Left Hand Lateral Pinch 13 lbs    Left Hand 3 Point Pinch 9 lbs             Grip  and prehension strength improved greatly within normal limits for her age Fine motor coordination improved since 2 weeks ago with simulated ten peg test  - improved from 40 to 25 seconds on the right hand, 36 seconds to 29 seconds on left. Patient do show still issues with manipulation of small objects in three-point rotating hand to insert PEG. Continue to have difficulty with manipulation or opposition manipulation of objects, from palm to ulnar  2 fingers,  and palm to index and middle finger. Patient to continue working on manipulation and using ulnar side of hand storage while retrieving and releasing small objects with  three-point pinch and two-point pinch.    Pt to do moist heat or paraffin , table slides/prayer stretch or rolling over putty - done on clinic with scar massage uisng mini massager  And then weight shifting - and wall pushups 10 reps - no pain  10 - 20 reps  Pain free  Golf ball palm <> fingers tips  Then palm to 2nd and 3rd , palm 4th and 5th  20 reps  Rotate 2 balls in palm  Pick up 5 small object one at time to palm - out to finger tips one at time - use 4th and 5th as storage  Teal med putty - 5  1 cm objects in putty and retrieve using 2 point pinch of R to retrieve    1 x day         OT Treatments/Exercises (OP) - 09/13/21 0001       RUE Paraffin   Number Minutes Paraffin 8 Minutes    RUE Paraffin Location Hand    Comments prior to table slides and wall pushups                    OT Education - 09/13/21 1624     Education Details progress and HEP    Person(s) Educated Patient    Methods Explanation;Demonstration;Tactile cues;Verbal cues;Handout    Comprehension Verbal cues required;Returned demonstration;Verbalized understanding              OT Short Term Goals - 08/24/21 2048       OT SHORT TERM GOAL #1   Title Pt to be independeint in HEP to decrease scar tissue, stiffness and increase extenton of digits and wrist to be able to do weight bearing for yoga    Baseline Wrist ext 45 and -35 to -45 for 3rd and 4th MC of digits- scar tissue thick    Time 4    Period Weeks    Status New    Target Date 09/21/21               OT Long Term Goals - 08/24/21 2050       OT LONG TERM GOAL #1   Title Pt to make composite fist with R hand touching palm without increase symptoms and more ease    Baseline Pt able to touch palm but no DIP flexion of 3rd and 4th -and great effort to touch palm - [facility] 's 95-100 and PIP 's 96 -    Time 4    Period Weeks    Status New    Target Date 09/21/21  OT LONG TERM GOAL #2   Title R prehension strength  increase wit 1-2 lbs to do buttons, write and open packages wiht more ease    Baseline 3 point pinch and lat pinch decrease compare to last year on R hand was 14 lat , 10 3point - now 10 and 9 lbs    Time 8    Period Weeks    Status New    Target Date 10/19/21      OT LONG TERM GOAL #3   Title R hand digits FMC improve with more than 2 sec on peg test  for pt to retrieve object out ot palm with ease, doodle ad write with more ease    Baseline 10 peg simulations R 40 sec, L 36 sec - and taking out 10 sec R and L 8 sec- pt is R hand dominant -trouble with [facility]    Time 8    Period Weeks    Status New    Target Date 10/19/21                   Plan - 09/13/21 1626     Clinical Impression Statement Pt present s/p dupuytrens release on R hand - and had on 03/28/21 debridement of a Dupuytren's contracture of her right palm with a right long trigger finger release. Pt refer to OT  5 1/2 months out for continues stiffness ,hand weakness and decrease FMC in ADL's and IADL's. jPt show decrease extention of 3rd and 4th MC , able to touch palm but unable to make composite tight fist with 3rd and 4th - limited by thick scar tissue in palm, Pt do show some arthritic changes in thumb - unable to make opposition pad on pad and thumb ADD during prehension. She showed decrease lat and 3 point pinch and decrease FMC in evaluation -but show great progress in 2 wks in [facility] - improve from 40 sec to 25 sec on the R, L 36 to 29 sec for simulated 10 peg test . Pt is R hand dominant - pt cont to show decrease opposition with object palm to fingertips , and using ulnar side of hand for storage and 3 point use for release as well as 3 point pinch manipulation.  Pt limited in use of R dominant - limiting her functional use - pt can benefit from skilled OT services.    OT Occupational Profile and History Problem Focused Assessment - Including review of records relating to presenting problem    Occupational performance  deficits (Please refer to evaluation for details): ADL's;IADL's;Work;Play;Leisure;Social Participation    Body Structure / Function / Physical Skills ADL;Strength;UE functional use;ROM;Scar mobility;Flexibility;FMC;Dexterity;IADL;Coordination    Rehab Potential Good    Clinical Decision Making Limited treatment options, no task modification necessary    Comorbidities Affecting Occupational Performance: None    Modification or Assistance to Complete Evaluation  No modification of tasks or assist necessary to complete eval    OT Frequency Biweekly    OT Duration 6 weeks    OT Treatment/Interventions Self-care/ADL training;Splinting;Manual Therapy;Paraffin;Passive range of motion;Scar mobilization;Therapeutic exercise;Contrast Bath;Fluidtherapy;Patient/family education    Consulted and Agree with Plan of Care Patient             Patient will benefit from skilled therapeutic intervention in order to improve the following deficits and impairments:   Body Structure / Function / Physical Skills: ADL, Strength, UE functional use, ROM, Scar mobility, Flexibility, FMC, Dexterity, IADL, Coordination  Visit Diagnosis: Muscle weakness (generalized)  Stiffness of right hand, not elsewhere classified  Scar condition and fibrosis of skin  Other lack of coordination    Problem List Patient Active Problem List   Diagnosis Date Noted   Anxiety 04/23/2017   Depression 04/23/2017   Gestational diabetes 04/23/2017   Endometriosis 04/23/2017   Menstrual problem 04/23/2017   Status post normal childbirth 04/23/2017   Physical exam, organ donor 02/05/2013   Pre-op evaluation 02/05/2013   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    Rosalyn Gess, OTR/L,CLT 09/13/2021, 4:33 PM  [facility] [address], [place], [address] Phone: [phone]   Fax:  [phone]  Name: Benny Henrie MRN: [id number] Date of Birth:  15-Apr-1956

## 2021-09-27 ENCOUNTER — Other Ambulatory Visit: Payer: Self-pay

## 2021-09-27 ENCOUNTER — Ambulatory Visit: Payer: BC Managed Care – PPO | Admitting: Occupational Therapy

## 2021-09-27 DIAGNOSIS — M6281 Muscle weakness (generalized): Secondary | ICD-10-CM

## 2021-09-27 DIAGNOSIS — R278 Other lack of coordination: Secondary | ICD-10-CM

## 2021-09-27 DIAGNOSIS — M25641 Stiffness of right hand, not elsewhere classified: Secondary | ICD-10-CM

## 2021-09-27 NOTE — Therapy (Signed)
[place] [facility] [facility] ?[address] ?[place], [place], [address] ?Phone: [phone]   Fax:  [phone] ? ?Occupational Therapy Treatment ? ?Patient Details  ?Name: Gabrielle Malone ?MRN: [id number] ?Date of Birth: 09/07/1955 ?Referring Provider (OT): Dr Roland Rack ? ? ?Encounter Date: 09/27/2021 ? ? OT End of Session - 09/27/21 1407   ? ? Visit Number 3   ? Number of Visits 8   ? Date for OT Re-Evaluation 10/19/21   ? OT Start Time 1407   ? OT Stop Time 6812   ? OT Time Calculation (min) 38 min   ? Activity Tolerance Patient tolerated treatment well   ? Behavior During Therapy [facility] for tasks assessed/performed   ? ?  ?  ? ?  ? ? ?Past Medical History:  ?Diagnosis Date  ? Alcohol abuse   ? Anxiety   ? Complication of anesthesia   ? Remembers part of surgery from rhinoplasty and woke up during another surgery  ? Depression   ? Endometriosis   ? GERD (gastroesophageal reflux disease)   ? Gestational diabetes   ? Insomnia   ? ? ?Past Surgical History:  ?Procedure Laterality Date  ? carpal tunnel    ? CESAREAN SECTION    ? 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  ? Procedure: DUPUYTREN CONTRACTURE RELEASE RIGHT HAND AND RELEASE OF RIGHT RING TRIGGER FINGER;  Surgeon: Corky Mull, MD;  Location: [facility] OR[facility];  Service: Orthopedics;  Laterality: Right;  ? GASTRIC BYPASS    ? OOPHORECTOMY    ? RHINOPLASTY    ? TRIGGER FINGER RELEASE Right 10/13/2020  ? Procedure: RIGHT MIDDLE FINGER 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;  Surgeon: Corky Mull, MD;  Location: [facility];  Service: Orthopedics;  Laterality: Right;  ? ? ?There were no vitals filed for this visit. ? ? Subjective Assessment - 09/27/21 1407   ? ? Subjective  Thinks is getting better but I still right horrible, my drawings is not good like it used to be, I could not open a preservative jar this morning and with this cold weather my hands is stiff and my paraffin machine at home is not  working   ? Pertinent History MALANEY MCBEAN is a 66 y.o. female seen by Dr Roland Rack 08/18/21  for follow-up now nearly 5 months status post release of a Dupuytren's contracture involving the right palm as well as release of the right ring trigger finger. Overall, the patient feels that she is doing well. She still notes some intermittent soreness in the hand which she rates at 2/10 on today's visit. However she is not taking any medications for discomfort at this time. She has completed formal occupational therapy, but continues to do exercises on her own at home. She has resumed most of her normal daily activities without difficulty, but continues to note difficulty with certain fine motor skill activities such as painting or handwriting. She denies any reinjury to the hand, and denies any fevers or chills. She also denies any numbness or paresthesias to her fingers. -refer to OT for [facility] , stiffness and weakness   ? Patient Stated Goals I want my stiffness in hand better, stronger and able to write, doodle, buttons, cut, open jars, packages and carry/do yoga pose   ? Currently in Pain? No/denies   ? ?  ?  ? ?  ? ? ? ? ? [facility] OT Assessment - 09/27/21 0001   ? ?  ?  Strength  ? Right Hand Grip (lbs) 51   ? Right Hand Lateral Pinch 14 lbs   ? Right Hand 3 Point Pinch 10 lbs   ? Left Hand Grip (lbs) 46   ? Left Hand Lateral Pinch 13 lbs   ? Left Hand 3 Point Pinch 9 lbs   ? ?  ?  ? ?  ? ? ? ? ? ?  ? Grip  and prehension strength within normal limits for her age ?Fine motor coordination improved since 4 weeks ago with simulated ten peg test  - improved from 40 to 25 seconds on the right hand, 36 seconds to 29 seconds on left. ?Patient do show still issues with manipulation of small objects in three-point rotating hand to insert PEG but if pay attention she can do it - pt to practice at home ?Continue to have difficulty with manipulation or opposition manipulation of objects, from palm to ulnar  2 fingers,  and palm to index  and middle finger. ?Patient to continue working on manipulation and using ulnar side of hand storage while retrieving and releasing small objects with three-point pinch and two-point pinch. ?Reviewed again and reinforce importance for pt to do at home  ?  ?  ?Pt to do moist heat or paraffin , table slides/prayer stretch or rolling over putty And then weight shifting - and wall pushups 10 reps - no pain  ?10 - 20 reps ? Pain free  ?Golf ball palm <> fingers tips  ?Then palm to 2nd and 3rd , palm 4th and 5th  ?20 reps  ?Pick up 5 small object one at time to palm - out to finger tips one at time - use 4th and 5th as storage  ?Teal med putty - 5  1 cm objects in putty and retrieve using 2 point pinch of R to retrieve  ?Reinforce for pt that she did not draw, paint since April 22 -and will need to practice  ?And then importance about doing her HEP - ?Follow up in month  ?  ? ? ? ? ? ? ? ? ? ? ? ? ? OT Education - 09/27/21 1407   ? ? Education Details progress and HEP   ? Person(s) Educated Patient   ? Methods Explanation;Demonstration;Tactile cues;Verbal cues;Handout   ? Comprehension Verbal cues required;Returned demonstration;Verbalized understanding   ? ?  ?  ? ?  ? ? ? OT Short Term Goals - 08/24/21 2048   ? ?  ? OT SHORT TERM GOAL #1  ? Title Pt to be independeint in HEP to decrease scar tissue, stiffness and increase extenton of digits and wrist to be able to do weight bearing for yoga   ? Baseline Wrist ext 45 and -35 to -45 for 3rd and 4th MC of digits- scar tissue thick   ? Time 4   ? Period Weeks   ? Status New   ? Target Date 09/21/21   ? ?  ?  ? ?  ? ? ? ? OT Long Term Goals - 08/24/21 2050   ? ?  ? OT LONG TERM GOAL #1  ? Title Pt to make composite fist with R hand touching palm without increase symptoms and more ease   ? Baseline Pt able to touch palm but no DIP flexion of 3rd and 4th -and great effort to touch palm - [facility] 's 95-100 and PIP 's 90 -   ? Time 4   ? Period Weeks   ?  Status New   ? Target Date  09/21/21   ?  ? OT LONG TERM GOAL #2  ? Title R prehension strength increase wit 1-2 lbs to do buttons, write and open packages wiht more ease   ? Baseline 3 point pinch and lat pinch decrease compare to last year on R hand was 14 lat , 10 3point - now 10 and 9 lbs   ? Time 8   ? Period Weeks   ? Status New   ? Target Date 10/19/21   ?  ? OT LONG TERM GOAL #3  ? Title R hand digits FMC improve with more than 2 sec on peg test  for pt to retrieve object out ot palm with ease, doodle ad write with more ease   ? Baseline 10 peg simulations R 40 sec, L 36 sec - and taking out 10 sec R and L 8 sec- pt is R hand dominant -trouble with [facility]   ? Time 8   ? Period Weeks   ? Status New   ? Target Date 10/19/21   ? ?  ?  ? ?  ? ? ? ? ? ? ? ? Plan - 09/27/21 1408   ? ? Clinical Impression Statement Patient present postop Dupuytren's release on the right hand April 2022 and then in a September 2022 had a debridement of the Dupuytren's contracture as well as a right long finger release.  Patient was referred about a month ago for stiffness hand weakness and decreased fine motor control in ADLs and IADLs.  Patient able to make a fist and touch palm but not composite fisting because of scar tissue.  Patient can do a wall push-up now and working on weight shifting on floor pain-free.  She do have arthritic changes to the thumb affecting her three-point and two-point precision pinch.  Her grip and prehension strength is within range for her age.  Patient's fine motor control did improve in the right hand from 40 seconds to 25, left hand from 36 seconds to 29 seconds.  Patient to continue to work on opposition of objects palm to fingertips, using ulnar side of hand storage and three-point use of release as well as three-point pinch object manipulation.  Discussed with patient that it is okay to get maybe a jar opener to help with tight jars, check into Silver sneakers with her insurance for gym membership, referred to tai chi class.   To look into fixing paraffin bath to decrease stiffness in the mornings.  Doing her home program and that she needs to practice writing and drawing after not doing it for about 10 months.  Patient in agreeme

## 2021-10-26 ENCOUNTER — Ambulatory Visit: Payer: BC Managed Care – PPO | Attending: Surgery | Admitting: Occupational Therapy

## 2021-10-26 DIAGNOSIS — M6281 Muscle weakness (generalized): Secondary | ICD-10-CM | POA: Diagnosis present

## 2021-10-26 DIAGNOSIS — M25641 Stiffness of right hand, not elsewhere classified: Secondary | ICD-10-CM | POA: Insufficient documentation

## 2021-10-26 DIAGNOSIS — L905 Scar conditions and fibrosis of skin: Secondary | ICD-10-CM | POA: Diagnosis present

## 2021-10-26 DIAGNOSIS — R278 Other lack of coordination: Secondary | ICD-10-CM | POA: Insufficient documentation

## 2021-10-26 NOTE — Therapy (Signed)
[facility] ?[address] ?[place], [place], [address] ?Phone: [phone]   Fax:  [phone] ? ?Occupational Therapy Treatment ? ?Patient Details  ?Name: Gabrielle Malone ?MRN: [id number] ?Date of Birth: 25-Oct-1955 ?Referring Provider (OT): Dr Roland Rack ? ? ?Encounter Date: 10/26/2021 ? ? OT End of Session - 10/26/21 1519   ? ? Visit Number 4   ? Number of Visits 6   ? Date for OT Re-Evaluation 01/18/22   ? OT Start Time 1319   ? OT Stop Time 1400   ? OT Time Calculation (min) 41 min   ? Activity Tolerance Patient tolerated treatment well   ? Behavior During Therapy [facility] for tasks assessed/performed   ? ?  ?  ? ?  ? ? ?Past Medical History:  ?Diagnosis Date  ? Alcohol abuse   ? Anxiety   ? Complication of anesthesia   ? Remembers part of surgery from rhinoplasty and woke up during another surgery  ? Depression   ? Endometriosis   ? GERD (gastroesophageal reflux disease)   ? Gestational diabetes   ? Insomnia   ? ? ?Past Surgical History:  ?Procedure Laterality Date  ? carpal tunnel    ? CESAREAN SECTION    ? 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  ? Procedure: DUPUYTREN CONTRACTURE RELEASE RIGHT HAND AND RELEASE OF RIGHT RING TRIGGER FINGER;  Surgeon: Corky Mull, MD;  Location: [facility] ORS;  Service: Orthopedics;  Laterality: Right;  ? GASTRIC BYPASS    ? OOPHORECTOMY    ? RHINOPLASTY    ? TRIGGER FINGER RELEASE Right 10/13/2020  ? Procedure: RIGHT MIDDLE FINGER 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;  Surgeon: Corky Mull, MD;  Location: [facility] OR[facility];  Service: Orthopedics;  Laterality: Right;  ? ? ?There were no vitals filed for this visit. ? ? Subjective Assessment - 10/26/21 1518   ? ? Subjective  I will I did not contact [facility] about those exercise classes and tai chi.  Just a lot on me this week with my husband.  Still can like my hand in a tight fist or opening my hand all the way.  But I do want to start  exercising can start with 10 pounds.   ? Pertinent History Gabrielle Malone is a 66 y.o. female seen by Dr Roland Rack 08/18/21  for follow-up now nearly 5 months status post release of a Dupuytren's contracture involving the right palm as well as release of the right ring trigger finger. Overall, the patient feels that she is doing well. She still notes some intermittent soreness in the hand which she rates at 2/10 on today's visit. However she is not taking any medications for discomfort at this time. She has completed formal occupational therapy, but continues to do exercises on her own at home. She has resumed most of her normal daily activities without difficulty, but continues to note difficulty with certain fine motor skill activities such as painting or handwriting. She denies any reinjury to the hand, and denies any fevers or chills. She also denies any numbness or paresthesias to her fingers. -refer to OT for [facility] , stiffness and weakness   ? Patient Stated Goals I want my stiffness in hand better, stronger and able to write, doodle, buttons, cut, open jars, packages and carry/do yoga pose   ? Currently in Pain? No/denies   ? ?  ?  ? ?  ? ? ? ? ? [facility]  OT Assessment - 10/26/21 0001   ? ?  ? Strength  ? Right Hand Grip (lbs) 60   ? Right Hand Lateral Pinch 14 lbs   ? Right Hand 3 Point Pinch 10 lbs   ? Left Hand Grip (lbs) 55   ? Left Hand Lateral Pinch 14 lbs   ? Left Hand 3 Point Pinch 10 lbs   ? ?  ?  ? ?  ? ? ? ? ? ?Grip  and prehension strength  in top range  for her age ?Fine motor coordination improved since 8 weeks ago with simulated ten peg test  - improved from 40 to 25 seconds on the right hand, 36 seconds to 29 seconds on left. ?  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 ?  Reviewed with patient today about returning back to normal activities working out in the gym, contacting [employer] for yoga, Pilates or tai chi  ?Patient can modify weightbearing by using a towel or a weight but also can do weightbearing through elbow with plain poses  ?Patient can do 3 pounds during elbow, triceps and scapular retraction  ?Discussed with patient to return to normal activities and using the right hand  ?She has a good right grip strength and prehension strength  ?She would need to practice her painting and drawing activities because she did not do it for a year.   ?Patient wants to follow-up in 6 weeks while checking into and encouraging patient to look into community exercise programs that she can return to  ?Remind patient to not have her hand injury limit her and returning back to activities  ?.    ?  ?  ?  ? ? ? ? ? ? ? ? ? ? ? ? ? OT Education - 10/26/21 1519   ? ? Education Details progress and HEP   ? Person(s) Educated Patient   ? Methods Explanation;Demonstration;Tactile cues;Verbal cues;Handout   ? Comprehension Verbal cues required;Returned demonstration;Verbalized understanding   ? ?  ?  ? ?  ? ? ? OT Short Term Goals - 10/26/21 1527   ? ?  ? OT SHORT TERM GOAL #1  ? Title Pt to be independeint in HEP to decrease scar tissue, stiffness and increase extenton of digits and wrist to be able to do weight bearing for yoga   ? Baseline Wrist ext 45 and -35 to -45 for 3rd and 4th MC of digits- scar tissue thick NOW pt can do wall pushups and weight shift on hand on floor - but modify to weight bear over roll up towel, handle or weight for pushup -or do planks thru elbows   ? Status Achieved   ? ?  ?  ? ?  ? ? ? ? OT Long Term Goals - 10/26/21 1528   ? ?  ? OT LONG TERM GOAL #1  ? Title Pt to make composite fist with R hand touching palm without increase symptoms and more ease   ? Baseline Pt able to touch  palm but no DIP flexion of 3rd and 4th -and great effort to touch palm - [facility] 's 95-100 and PIP 's  90 -  NOW scar tissue limiting her to get DIP flexion and composite   ? Status Deferred   ?  ? OT LONG TERM GOAL #2  ? Title R prehension strength increase wit 1-2 lbs to do buttons, write and open packages wiht more ease   ? Baseline 3 point pinch and lat pinch decrease compare to last year on R hand was 14 lat , 10 3point - now 10 and 9 lbs  NOW great progress - upper range for her age -and can do most all activities   ? Status Achieved   ?  ? OT LONG TERM GOAL #3  ? Title R hand digits FMC improve with more than 2 sec on peg test  for pt to retrieve object out ot palm with ease, doodle ad write with more ease   ? Baseline 10 peg simulations R 40 sec, L 36 sec - and taking out 10 sec R and L 8 sec- pt is R hand dominant -trouble with [facility] NOW great progress in bil hands , retrieve objects out of palm and manipulation   ? Status Achieved   ?  ? OT LONG TERM GOAL #5  ? Title Patient initiate to return to prior level of function with exercise classes, or weights, drawing, painting   ? 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 Time 12   ? Period Weeks   ? Status New   ? Target Date 01/18/22   ? ?  ?  ? ?  ? ? ? ? ? ? ? ? Plan - 10/26/21 1522   ? ? Clinical Impression Statement Patient present postop Dupuytren's release on the right hand April 2022 and then in a September 2022 had a debridement of the Dupuytren's contracture as well as a right long finger release.  Patient was referred about 2 months ago for stiffness hand weakness and decreased fine motor control in ADLs and IADLs.  Patient able to make a fist and touch palm but not composite fisting because of scar tissue.  Patient can do a wall push-up now and working on weight shifting on floor pain-free.  She do have arthritic changes to the thumb affecting her three-point and two-point precision pinch.  Her grip and  prehension strength is  WNL for her age at top of the range.  Patient's fine motor control did improve in the right hand from 40 seconds to 25, left hand from 36 seconds to 29 seconds.  Discussed with patient toda

## 2021-11-06 ENCOUNTER — Encounter: Payer: BC Managed Care – PPO | Admitting: Occupational Therapy

## 2021-12-07 ENCOUNTER — Ambulatory Visit: Payer: BC Managed Care – PPO | Attending: Surgery | Admitting: Occupational Therapy

## 2022-04-09 ENCOUNTER — Other Ambulatory Visit: Payer: Self-pay

## 2022-04-09 ENCOUNTER — Telehealth: Payer: Self-pay

## 2022-04-09 DIAGNOSIS — Z1211 Encounter for screening for malignant neoplasm of colon: Secondary | ICD-10-CM

## 2022-04-09 MED ORDER — NA SULFATE-K SULFATE-MG SULF 17.5-3.13-1.6 GM/177ML PO SOLN: 1.0000 | Freq: Once | ORAL | 0 refills | Status: AC

## 2022-04-09 NOTE — Telephone Encounter (Signed)
Gastroenterology Pre-Procedure Request Date: 05/25/22 Requesting Physician: Dr. Marius Ditch  PATIENT REVIEW QUESTIONS: The patient responded to the following health history questions as indicated:    1. Are you having any GI issues? no 2. Do you have a personal history of Polyps? yes (unsure of the last colonoscopy.  Did not see a record of this) 3. Do you have a family history of Colon Cancer or Polyps? yes (paternal aunts colon cancer) 4. Diabetes Mellitus? no 5. Joint replacements in the past 12 months?no 6. Major health problems in the past 3 months?no 7. Any artificial heart valves, MVP, or defibrillator?no    MEDICATIONS & ALLERGIES:    Patient reports the following regarding taking any anticoagulation/antiplatelet therapy:   Plavix, Coumadin, Eliquis, Xarelto, Lovenox, Pradaxa, Brilinta, or Effient? no Aspirin? no  Patient confirms/reports the following medications:  Current Outpatient Medications  Medication Sig Dispense Refill   amphetamine-dextroamphetamine (ADDERALL) 10 MG tablet Take 10 mg by mouth daily with breakfast. Can take up to twice daily     cyanocobalamin (,VITAMIN B-12,) 1000 MCG/ML injection Inject 1,000 mcg into the muscle every 30 (thirty) days.     docusate sodium (COLACE) 100 MG capsule Take 1 tablet once or twice daily as needed for constipation while taking narcotic pain medicine 30 capsule 0   gabapentin (NEURONTIN) 300 MG capsule Take 150-300 mg by mouth as needed.     omeprazole (PRILOSEC) 40 MG capsule Take 40 mg by mouth daily as needed (acid reflux).     oxyCODONE-acetaminophen (PERCOCET) 5-325 MG tablet Take 2 tablets by mouth every 6 (six) hours as needed for severe pain. 16 tablet 0   propranolol (INDERAL) 20 MG tablet Take 20 mg by mouth every morning.     sertraline (ZOLOFT) 100 MG tablet Take 100 mg by mouth every morning.     zolpidem (AMBIEN) 10 MG tablet Take 10 mg by mouth at bedtime.  0   No current facility-administered medications for this  visit.    Patient confirms/reports the following allergies:  No Known Allergies  No orders of the defined types were placed in this encounter.   AUTHORIZATION INFORMATION Primary Insurance: 1D#: Group #:  Secondary Insurance: 1D#: Group #:  SCHEDULE INFORMATION: Date: 05/25/22 Time: Location: [facility]

## 2022-05-25 ENCOUNTER — Encounter: Payer: Self-pay | Admitting: Gastroenterology

## 2022-05-25 ENCOUNTER — Ambulatory Visit: Payer: BC Managed Care – PPO | Admitting: Anesthesiology

## 2022-05-25 ENCOUNTER — Encounter
Admission: RE | Disposition: A | Discharge status: Home / Self Care | Payer: Self-pay | Source: Home / Self Care | Attending: Gastroenterology

## 2022-05-25 ENCOUNTER — Ambulatory Visit
Admission: RE | Admit: 2022-05-25 | Discharge: 2022-05-25 | Disposition: A | Discharge status: Home / Self Care | Payer: BC Managed Care – PPO | Attending: Gastroenterology | Admitting: Gastroenterology

## 2022-05-25 ENCOUNTER — Other Ambulatory Visit: Payer: Self-pay

## 2022-05-25 DIAGNOSIS — D122 Benign neoplasm of ascending colon: Secondary | ICD-10-CM | POA: Diagnosis not present

## 2022-05-25 DIAGNOSIS — Z800 Family history of malignant neoplasm of digestive organs: Secondary | ICD-10-CM

## 2022-05-25 DIAGNOSIS — K635 Polyp of colon: Secondary | ICD-10-CM

## 2022-05-25 DIAGNOSIS — E119 Type 2 diabetes mellitus without complications: Secondary | ICD-10-CM | POA: Diagnosis not present

## 2022-05-25 DIAGNOSIS — D123 Benign neoplasm of transverse colon: Secondary | ICD-10-CM | POA: Insufficient documentation

## 2022-05-25 DIAGNOSIS — Z1211 Encounter for screening for malignant neoplasm of colon: Secondary | ICD-10-CM

## 2022-05-25 DIAGNOSIS — K219 Gastro-esophageal reflux disease without esophagitis: Secondary | ICD-10-CM | POA: Insufficient documentation

## 2022-05-25 HISTORY — PX: COLONOSCOPY WITH PROPOFOL: SHX5780

## 2022-05-25 SURGERY — COLONOSCOPY WITH PROPOFOL
Anesthesia: General

## 2022-05-25 MED ORDER — PROPOFOL 500 MG/50ML IV EMUL
INTRAVENOUS | Status: DC | PRN
  Administered 2022-05-25: 150 ug/kg/min via INTRAVENOUS

## 2022-05-25 MED ORDER — PROPOFOL 10 MG/ML IV BOLUS
INTRAVENOUS | Status: DC | PRN
  Administered 2022-05-25: 30 mg via INTRAVENOUS
  Administered 2022-05-25: 50 mg via INTRAVENOUS

## 2022-05-25 MED ORDER — LIDOCAINE HCL (CARDIAC) PF 100 MG/5ML IV SOSY
PREFILLED_SYRINGE | INTRAVENOUS | Status: DC | PRN
  Administered 2022-05-25: 50 mg via INTRAVENOUS

## 2022-05-25 MED ORDER — SODIUM CHLORIDE 0.9 % IV SOLN: INTRAVENOUS | Status: DC

## 2022-05-25 MED ORDER — PROPOFOL 1000 MG/100ML IV EMUL
INTRAVENOUS | Status: AC
  Filled 2022-05-25: qty 100

## 2022-05-25 MED ORDER — DEXMEDETOMIDINE HCL IN NACL 80 MCG/20ML IV SOLN
INTRAVENOUS | Status: DC | PRN
  Administered 2022-05-25: 4 ug via BUCCAL

## 2022-05-25 NOTE — Transfer of Care (Signed)
Immediate Anesthesia Transfer of Care Note  Patient: Gabrielle Malone  Procedure(s) Performed: COLONOSCOPY WITH PROPOFOL  Patient Location: PACU  Anesthesia Type:General  Level of Consciousness: awake and drowsy  Airway & Oxygen Therapy: Patient Spontanous Breathing  Post-op Assessment: Report given to RN and Post -op Vital signs reviewed and stable  Post vital signs: Reviewed and stable  Last Vitals:  Vitals Value Taken Time  BP 107/67 05/25/22 0958  Temp 35.8 C 05/25/22 0957  Pulse 59 05/25/22 0958  Resp 16 05/25/22 0958  SpO2 100 % 05/25/22 0958    Last Pain:  Vitals:   05/25/22 0957  TempSrc: Temporal  PainSc:          Complications: No notable events documented.

## 2022-05-25 NOTE — Anesthesia Procedure Notes (Signed)
Date/Time: 05/25/2022 9:28 AM  Performed by: Johnna Acosta, CRNAPre-anesthesia Checklist: Patient identified, Suction available, Emergency Drugs available, Patient being monitored and Timeout performed Patient Re-evaluated:Patient Re-evaluated prior to induction Oxygen Delivery Method: Nasal cannula Induction Type: IV induction

## 2022-05-25 NOTE — Anesthesia Postprocedure Evaluation (Signed)
Anesthesia Post Note  Patient: Gabrielle Malone  Procedure(s) Performed: COLONOSCOPY WITH PROPOFOL  Patient location during evaluation: Endoscopy Anesthesia Type: General Level of consciousness: awake and alert Pain management: pain level controlled Vital Signs Assessment: post-procedure vital signs reviewed and stable Respiratory status: spontaneous breathing, nonlabored ventilation, respiratory function stable and patient connected to nasal cannula oxygen Cardiovascular status: blood pressure returned to baseline and stable Postop Assessment: no apparent nausea or vomiting Anesthetic complications: no   No notable events documented.   Last Vitals:  Vitals:   05/25/22 0958 05/25/22 1017  BP: 107/67 120/78  Pulse: (!) 59   Resp: 16   Temp:    SpO2: 100%     Last Pain:  Vitals:   05/25/22 1007  TempSrc:   PainSc: 0-No pain                 Precious Haws Kennie Snedden

## 2022-05-25 NOTE — H&P (Signed)
Cephas Darby, MD [address]  [place]  [place], [place] [address]  Main: [phone]  Fax: [phone] Pager: [phone]  Primary Care Physician:  Maryland Pink, MD Primary Gastroenterologist:  Dr. Cephas Darby  Pre-Procedure History & Physical: HPI:  Diedra Sinor is a 66 y.o. female is here for an colonoscopy.   Past Medical History:  Diagnosis Date   Alcohol abuse    Anxiety    Complication of anesthesia    Remembers part of surgery from rhinoplasty and woke up during another surgery   Depression    Endometriosis    GERD (gastroesophageal reflux disease)    Insomnia     Past Surgical History:  Procedure Laterality Date   carpal tunnel     CESAREAN SECTION     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 Authorizing Provider  amphetamine-dextroamphetamine (ADDERALL) 10 MG tablet Take 10 mg by mouth daily with breakfast. Can take up to twice daily 09/14/20  Yes [provider]  cyanocobalamin (,VITAMIN B-12,) 1000 MCG/ML injection Inject 1,000 mcg into the muscle every 30 (thirty) days.   Yes [provider]  docusate sodium (COLACE) 100 MG capsule Take 1 tablet once or twice daily as needed for constipation while taking narcotic pain medicine 05/23/21  Yes Hinda Kehr, MD  gabapentin (NEURONTIN) 300 MG capsule Take 150-300 mg by mouth as needed.   Yes [provider]  omeprazole  (PRILOSEC) 40 MG capsule Take 40 mg by mouth daily as needed (acid reflux). 04/20/17  Yes [provider]  oxyCODONE-acetaminophen (PERCOCET) 5-325 MG tablet Take 2 tablets by mouth every 6 (six) hours as needed for severe pain. 05/23/21  Yes Hinda Kehr, MD  propranolol (INDERAL) 20 MG tablet Take 20 mg by mouth every morning.   Yes [provider]  sertraline (ZOLOFT) 100 MG tablet Take 100 mg by mouth every morning.   Yes [provider]  zolpidem (AMBIEN) 10 MG tablet Take 10 mg by mouth at bedtime. 04/08/17  Yes [provider]    Allergies as of 04/10/2022   (No Known Allergies)    Family History  Problem Relation Age of Onset   Dementia Mother    Cancer Father        lung,,melanoma   Dementia Maternal Grandmother    Breast cancer Maternal Grandmother     Social History   Socioeconomic History   Marital status: Married    Spouse name: Not on file   Number of children: Not on file   Years of education: Not on file   Highest education level: Not on file  Occupational History   Not on file  Tobacco Use   Smoking status: Never   Smokeless tobacco: Never  Vaping Use   Vaping Use: Never used  Substance and Sexual Activity   Alcohol use: Yes    Comment: nightly wine   Drug use: No   Sexual  activity: Not on file  Other Topics Concern   Not on file  Social History Narrative   Not on file   Social Determinants of Health   Financial Resource Strain: Not on file  Food Insecurity: Not on file  Transportation Needs: Not on file  Physical Activity: Not on file  Stress: Not on file  Social Connections: Not on file  Intimate Partner Violence: Not on file    Review of Systems: See HPI, otherwise negative ROS  Physical Exam: BP (!) 150/94   Pulse (!) 54   Temp (!) 96.4 F (35.8 C) (Temporal)   Resp 18   Ht 5' (1.524 m)   Wt 54.4 kg   SpO2 100%   BMI 23.44 kg/m  General:   Alert,  pleasant and cooperative in NAD Head:   Normocephalic and atraumatic. Neck:  Supple; no masses or thyromegaly. Lungs:  Clear throughout to auscultation.    Heart:  Regular rate and rhythm. Abdomen:  Soft, nontender and nondistended. Normal bowel sounds, without guarding, and without rebound.   Neurologic:  Alert and  oriented x4;  grossly normal neurologically.  Impression/Plan: Tandy Grawe is here for an colonoscopy to be performed for colon cancer screening, family h/o colon cancer in 2 second degree relatives in their 36s  Risks, benefits, limitations, and alternatives regarding  colonoscopy have been reviewed with the patient.  Questions have been answered.  All parties agreeable.   Sherri Sear, MD  05/25/2022, 9:14 AM

## 2022-05-25 NOTE — Anesthesia Preprocedure Evaluation (Signed)
Anesthesia Evaluation  Patient identified by MRN, date of birth, ID band Patient awake    Reviewed: Allergy & Precautions, NPO status , Patient's Chart, lab work & pertinent test results  History of Anesthesia Complications (+) AWARENESS UNDER ANESTHESIA and history of anesthetic complications  Airway Mallampati: III  TM Distance: >3 FB Neck ROM: full    Dental  (+) Chipped   Pulmonary neg pulmonary ROS, neg shortness of breath   Pulmonary exam normal        Cardiovascular Exercise Tolerance: Good (-) angina negative cardio ROS Normal cardiovascular exam     Neuro/Psych  PSYCHIATRIC DISORDERS      negative neurological ROS     GI/Hepatic Neg liver ROS,GERD  Controlled,,  Endo/Other  diabetes, Type 2    Renal/GU negative Renal ROS  negative genitourinary   Musculoskeletal   Abdominal   Peds  Hematology negative hematology ROS (+)   Anesthesia Other Findings Past Medical History: No date: Alcohol abuse No date: Anxiety No date: Complication of anesthesia     Comment:  Remembers part of surgery from rhinoplasty and woke up               during another surgery No date: Depression No date: Endometriosis No date: GERD (gastroesophageal reflux disease) No date: Insomnia  Past Surgical History: No date: carpal tunnel No date: CESAREAN SECTION 03/28/2021: DUPUYTREN CONTRACTURE RELEASE; Right     Comment:  Procedure: DUPUYTREN CONTRACTURE RELEASE RIGHT HAND AND               RELEASE OF RIGHT RING TRIGGER FINGER;  Surgeon: Corky Mull, MD;  Location: [facility] OR[facility];  Service: Orthopedics;                Laterality: Right; No date: GASTRIC BYPASS No date: OOPHORECTOMY No date: RHINOPLASTY 10/13/2020: TRIGGER FINGER RELEASE; Right     Comment:  Procedure: RIGHT MIDDLE FINGER TRIGGER RELEASE WITH               DEBRIDEMENT OF EARLY DUPUYTREN'S CONTRACTURE;  Surgeon:               Corky Mull, MD;   Location: [facility] ORS;  Service:               Orthopedics;  Laterality: Right;  BMI    Body Mass Index: 23.44 kg/m      Reproductive/Obstetrics negative OB ROS                             Anesthesia Physical Anesthesia Plan  ASA: 3  Anesthesia Plan: General   Post-op Pain Management:    Induction: Intravenous  PONV Risk Score and Plan: Propofol infusion and TIVA  Airway Management Planned: Natural Airway and Nasal Cannula  Additional Equipment:   Intra-op Plan:   Post-operative Plan:   Informed Consent: I have reviewed the patients History and Physical, chart, labs and discussed the procedure including the risks, benefits and alternatives for the proposed anesthesia with the patient or authorized representative who has indicated his/her understanding and acceptance.     Dental Advisory Given  Plan Discussed with: Anesthesiologist, CRNA and Surgeon  Anesthesia Plan Comments: (Patient consented for risks of anesthesia including but not limited to:  - adverse reactions to medications - risk of airway placement if required - damage to eyes, teeth, lips or other oral  mucosa - nerve damage due to positioning  - sore throat or hoarseness - Damage to heart, brain, nerves, lungs, other parts of body or loss of life  Patient voiced understanding.)       Anesthesia Quick Evaluation

## 2022-05-25 NOTE — Op Note (Signed)
[facility] Patient Name: Gabrielle Malone Procedure Date: 05/25/2022 9:13 AM MRN: [id number] Account #: [id number] Date of Birth: [birth year] Admit Type: Outpatient Age: 66 Room: [facility] ENDO ROOM 2 Gender: Female Note Status: Finalized Instrument Name: Peds Colonoscope [id number] Procedure:             Colonoscopy Indications:           Screening for colon cancer: Family history of                         colorectal cancer in multiple 2nd degree relatives,                         Last colonoscopy: October 2010 Providers:             Lin Landsman MD, MD Referring MD:          Lin Landsman MD, MD (Referring MD), Irven Easterly.                         Kary Kos, MD (Referring MD) Medicines:             General Anesthesia Complications:         No immediate complications. Estimated blood loss: None. Procedure:             Pre-Anesthesia Assessment:                        - Prior to the procedure, a History and Physical was                         performed, and patient medications and allergies were                         reviewed. The patient is competent. The risks and                         benefits of the procedure and the sedation options and                         risks were discussed with the patient. All questions                         were answered and informed consent was obtained.                         Patient identification and proposed procedure were                         verified by the physician, the nurse, the                         anesthesiologist, the anesthetist and the technician                         in the pre-procedure area in the procedure room in the                         endoscopy suite. Mental Status Examination: alert and  oriented. Airway Examination: normal oropharyngeal                         airway and neck mobility. Respiratory Examination:                         clear to  auscultation. CV Examination: normal.                         Prophylactic Antibiotics: The patient does not require                         prophylactic antibiotics. Prior Anticoagulants: The                         patient has taken no anticoagulant or antiplatelet                         agents. ASA Grade Assessment: III - A patient with                         severe systemic disease. After reviewing the risks and                         benefits, the patient was deemed in satisfactory                         condition to undergo the procedure. The anesthesia                         plan was to use general anesthesia. Immediately prior                         to administration of medications, the patient was                         re-assessed for adequacy to receive sedatives. The                         heart rate, respiratory rate, oxygen saturations,                         blood pressure, adequacy of pulmonary ventilation, and                         response to care were monitored throughout the                         procedure. The physical status of the patient was                         re-assessed after the procedure.                        After obtaining informed consent, the colonoscope was                         passed under direct vision. Throughout the procedure,  the patient's blood pressure, pulse, and oxygen                         saturations were monitored continuously. The                         Colonoscope was introduced through the anus and                         advanced to the the terminal ileum, with                         identification of the appendiceal orifice and IC                         valve. The colonoscopy was performed without                         difficulty. The patient tolerated the procedure well.                         The quality of the bowel preparation was evaluated                         using the BBPS  [facility] Bowel Preparation Scale) with                         scores of: Right Colon = 3, Transverse Colon = 3 and                         Left Colon = 3 (entire mucosa seen well with no                         residual staining, small fragments of stool or opaque                         liquid). The total BBPS score equals 9. The terminal                         ileum, ileocecal valve, appendiceal orifice, and                         rectum were photographed. Findings:      The perianal and digital rectal examinations were normal. Pertinent       negatives include normal sphincter tone and no palpable rectal lesions.      The terminal ileum appeared normal.      A diminutive polyp was found in the ascending colon. The polyp was       sessile. The polyp was removed with a cold biopsy forceps. Resection and       retrieval were complete. Estimated blood loss: none.      A 5 mm polyp was found in the transverse colon. The polyp was sessile.       The polyp was removed with a cold snare. Resection and retrieval were       complete. Estimated blood loss: none.      The retroflexed view of the distal rectum and anal verge was normal  and       showed no anal or rectal abnormalities. Impression:            - The examined portion of the ileum was normal.                        - One diminutive polyp in the ascending colon, removed                         with a cold biopsy forceps. Resected and retrieved.                        - One 5 mm polyp in the transverse colon, removed with                         a cold snare. Resected and retrieved.                        - The distal rectum and anal verge are normal on                         retroflexion view. Recommendation:        - Discharge patient to home (with escort).                        - Resume previous diet today.                        - Continue present medications.                        - Await pathology results.                         - Repeat colonoscopy in 5 years for surveillance. Procedure Code(s):     --- Professional ---                        [phone], Colonoscopy, flexible; with removal of                         tumor(s), polyp(s), or other lesion(s) by snare                         technique                        45380, 9, Colonoscopy, flexible; with biopsy, single                         or multiple Diagnosis Code(s):     --- Professional ---                        Z80.0, Family history of malignant neoplasm of                         digestive organs                        D12.2, Benign neoplasm of ascending colon  D12.3, Benign neoplasm of transverse colon (hepatic                         flexure or splenic flexure) CPT copyright 2022 American Medical Association. All rights reserved. The codes documented in this report are preliminary and upon coder review may  be revised to meet current compliance requirements. Dr. Ulyess Mort Lin Landsman MD, MD 05/25/2022 9:56:00 AM This report has been signed electronically. Number of Addenda: 0 Note Initiated On: 05/25/2022 9:13 AM Scope Withdrawal Time: 0 hours 14 minutes 22 seconds  Total Procedure Duration: 0 hours 19 minutes 30 seconds  Estimated Blood Loss:  Estimated blood loss: none.      [facility]

## 2022-05-28 ENCOUNTER — Encounter: Payer: Self-pay | Admitting: Gastroenterology

## 2022-05-28 LAB — SURGICAL PATHOLOGY

## 2022-06-06 ENCOUNTER — Other Ambulatory Visit: Payer: Self-pay | Admitting: Family Medicine

## 2022-06-06 DIAGNOSIS — Z1231 Encounter for screening mammogram for malignant neoplasm of breast: Secondary | ICD-10-CM

## 2022-06-28 ENCOUNTER — Encounter: Payer: Self-pay | Admitting: Ophthalmology

## 2022-07-12 NOTE — Discharge Instructions (Signed)

## 2022-07-16 ENCOUNTER — Ambulatory Visit: Payer: BC Managed Care – PPO | Admitting: Anesthesiology

## 2022-07-16 ENCOUNTER — Ambulatory Visit
Admission: RE | Admit: 2022-07-16 | Discharge: 2022-07-16 | Disposition: A | Discharge status: Home / Self Care | Payer: BC Managed Care – PPO | Attending: Ophthalmology | Admitting: Ophthalmology

## 2022-07-16 ENCOUNTER — Other Ambulatory Visit: Payer: Self-pay

## 2022-07-16 ENCOUNTER — Encounter
Admission: RE | Disposition: A | Discharge status: Home / Self Care | Payer: Self-pay | Source: Home / Self Care | Attending: Ophthalmology

## 2022-07-16 DIAGNOSIS — Z79899 Other long term (current) drug therapy: Secondary | ICD-10-CM | POA: Diagnosis not present

## 2022-07-16 DIAGNOSIS — K219 Gastro-esophageal reflux disease without esophagitis: Secondary | ICD-10-CM | POA: Insufficient documentation

## 2022-07-16 DIAGNOSIS — I10 Essential (primary) hypertension: Secondary | ICD-10-CM | POA: Insufficient documentation

## 2022-07-16 DIAGNOSIS — H2511 Age-related nuclear cataract, right eye: Secondary | ICD-10-CM | POA: Insufficient documentation

## 2022-07-16 DIAGNOSIS — F32A Depression, unspecified: Secondary | ICD-10-CM | POA: Insufficient documentation

## 2022-07-16 DIAGNOSIS — E119 Type 2 diabetes mellitus without complications: Secondary | ICD-10-CM | POA: Insufficient documentation

## 2022-07-16 DIAGNOSIS — F419 Anxiety disorder, unspecified: Secondary | ICD-10-CM | POA: Insufficient documentation

## 2022-07-16 DIAGNOSIS — Z9884 Bariatric surgery status: Secondary | ICD-10-CM | POA: Diagnosis not present

## 2022-07-16 DIAGNOSIS — G4700 Insomnia, unspecified: Secondary | ICD-10-CM | POA: Insufficient documentation

## 2022-07-16 HISTORY — PX: CATARACT EXTRACTION W/PHACO: SHX586

## 2022-07-16 SURGERY — PHACOEMULSIFICATION, CATARACT, WITH IOL INSERTION
Anesthesia: Monitor Anesthesia Care | Site: Eye | Laterality: Right

## 2022-07-16 MED ORDER — FENTANYL CITRATE PF 50 MCG/ML IJ SOSY: 25.0000 ug | PREFILLED_SYRINGE | INTRAMUSCULAR | Status: DC | PRN

## 2022-07-16 MED ORDER — ARMC OPHTHALMIC DILATING DROPS
1.0000 | OPHTHALMIC | Status: DC | PRN
  Administered 2022-07-16 (×3): 1 via OPHTHALMIC

## 2022-07-16 MED ORDER — ONDANSETRON HCL 4 MG/2ML IJ SOLN: 4.0000 mg | Freq: Once | INTRAMUSCULAR | Status: DC | PRN

## 2022-07-16 MED ORDER — MIDAZOLAM HCL 2 MG/2ML IJ SOLN
INTRAMUSCULAR | Status: DC | PRN
  Administered 2022-07-16: 2 mg via INTRAVENOUS

## 2022-07-16 MED ORDER — SIGHTPATH DOSE#1 BSS IO SOLN
INTRAOCULAR | Status: DC | PRN
  Administered 2022-07-16: 114 mL via OPHTHALMIC

## 2022-07-16 MED ORDER — SIGHTPATH DOSE#1 BSS IO SOLN
INTRAOCULAR | Status: DC | PRN
  Administered 2022-07-16: 15 mL

## 2022-07-16 MED ORDER — FENTANYL CITRATE (PF) 100 MCG/2ML IJ SOLN
INTRAMUSCULAR | Status: DC | PRN
  Administered 2022-07-16: 50 ug via INTRAVENOUS
  Administered 2022-07-16 (×2): 25 ug via INTRAVENOUS

## 2022-07-16 MED ORDER — MOXIFLOXACIN HCL 0.5 % OP SOLN
OPHTHALMIC | Status: DC | PRN
  Administered 2022-07-16: .2 mL via OPHTHALMIC

## 2022-07-16 MED ORDER — TETRACAINE HCL 0.5 % OP SOLN
1.0000 [drp] | OPHTHALMIC | Status: DC | PRN
  Administered 2022-07-16 (×3): 1 [drp] via OPHTHALMIC

## 2022-07-16 MED ORDER — LIDOCAINE HCL (PF) 2 % IJ SOLN
INTRAOCULAR | Status: DC | PRN
  Administered 2022-07-16: 1 mL via INTRAOCULAR

## 2022-07-16 MED ORDER — SIGHTPATH DOSE#1 NA HYALUR & NA CHOND-NA HYALUR IO KIT
PACK | INTRAOCULAR | Status: DC | PRN
  Administered 2022-07-16: 1 via OPHTHALMIC

## 2022-07-16 MED ORDER — LACTATED RINGERS IV SOLN: INTRAVENOUS | Status: DC

## 2022-07-16 SURGICAL SUPPLY — 15 items
CATARACT SUITE SIGHTPATH (MISCELLANEOUS) ×1 IMPLANT
DISSECTOR HYDRO NUCLEUS 50X22 (MISCELLANEOUS) ×1 IMPLANT
FEE CATARACT SUITE SIGHTPATH (MISCELLANEOUS) ×1 IMPLANT
GLOVE SURG GAMMEX PI TX LF 7.5 (GLOVE) ×1 IMPLANT
GLOVE SURG SYN 8.5  E (GLOVE) ×1
GLOVE SURG SYN 8.5 E (GLOVE) ×1 IMPLANT
GLOVE SURG SYN 8.5 PF PI (GLOVE) ×1 IMPLANT
LENS IOL EYHANCE TORIC II 21.5 ×1 IMPLANT
LENS IOL EYHANCE TRC 150 21.5 IMPLANT
LENS IOL EYHNC TORIC 150 21.5 ×1 IMPLANT
NDL FILTER BLUNT 18X1 1/2 (NEEDLE) ×1 IMPLANT
NEEDLE FILTER BLUNT 18X1 1/2 (NEEDLE) ×1 IMPLANT
SYR 3ML LL SCALE MARK (SYRINGE) ×1 IMPLANT
SYR 5ML LL (SYRINGE) ×1 IMPLANT
WATER STERILE IRR 250ML POUR (IV SOLUTION) ×1 IMPLANT

## 2022-07-16 NOTE — Anesthesia Preprocedure Evaluation (Signed)
Anesthesia Evaluation  Patient identified by MRN, date of birth, ID band Patient awake    Reviewed: Allergy & Precautions, H&P , NPO status , Patient's Chart, lab work & pertinent test results, reviewed documented beta blocker date and time   History of Anesthesia Complications (+) history of anesthetic complications  Airway Mallampati: II  TM Distance: >3 FB Neck ROM: full    Dental no notable dental hx. (+) Teeth Intact   Pulmonary neg pulmonary ROS   Pulmonary exam normal breath sounds clear to auscultation       Cardiovascular Exercise Tolerance: Good hypertension, On Medications negative cardio ROS  Rhythm:regular Rate:Normal     Neuro/Psych  PSYCHIATRIC DISORDERS Anxiety Depression    negative neurological ROS     GI/Hepatic Neg liver ROS,GERD  Medicated,,  Endo/Other  negative endocrine ROSdiabetes, Well Controlled    Renal/GU      Musculoskeletal   Abdominal   Peds  Hematology negative hematology ROS (+)   Anesthesia Other Findings   Reproductive/Obstetrics negative OB ROS                             Anesthesia Physical Anesthesia Plan  ASA: 3  Anesthesia Plan: MAC   Post-op Pain Management:    Induction:   PONV Risk Score and Plan: 2  Airway Management Planned:   Additional Equipment:   Intra-op Plan:   Post-operative Plan:   Informed Consent: I have reviewed the patients History and Physical, chart, labs and discussed the procedure including the risks, benefits and alternatives for the proposed anesthesia with the patient or authorized representative who has indicated his/her understanding and acceptance.       Plan Discussed with: CRNA  Anesthesia Plan Comments:        Anesthesia Quick Evaluation

## 2022-07-16 NOTE — Op Note (Signed)
OPERATIVE NOTE  Gabrielle Malone [id number] 07/16/2022   PREOPERATIVE DIAGNOSIS:  Nuclear sclerotic cataract right eye.  H25.11   POSTOPERATIVE DIAGNOSIS:    Nuclear sclerotic cataract right eye.     PROCEDURE:  Phacoemusification with posterior chamber intraocular lens placement of the right eye   LENS:   Implant Name Type Inv. Item Serial No. Manufacturer Lot No. LRB No. Used Action  LENS IOL EYHANCE TORIC II 21.5 - [id number]  LENS IOL Tyler Deis II 21.5 [id number] [employer]  Right 1 Implanted       Procedure(s): CATARACT EXTRACTION PHACO AND INTRAOCULAR LENS PLACEMENT (IOC) RIGHT  EYHANCE TORIC LENS  2.47  00:35.1 (Right)  [id number] +21.5 @010  degrees   ULTRASOUND TIME: 0 minutes 35 seconds.  CDE 2.47   SURGEON:  Benay Pillow, MD, MPH  ANESTHESIOLOGIST: Anesthesiologist: Molli Barrows, MD CRNA: Jacqualin Combes, CRNA   ANESTHESIA:  Topical with tetracaine drops augmented with 1% preservative-free intracameral lidocaine.  ESTIMATED BLOOD LOSS: less than 1 mL.   COMPLICATIONS:  None.   DESCRIPTION OF PROCEDURE:  The patient was identified in the holding room and transported to the operating room and placed in the supine position under the operating microscope.  The right eye was identified as the operative eye and it was prepped and draped in the usual sterile ophthalmic fashion.  The verion system was registered without difficulty.   A 1.0 millimeter clear-corneal paracentesis was made at the 10:30 position. 0.5 ml of preservative-free 1% lidocaine with epinephrine was injected into the anterior chamber.  The anterior chamber was filled with viscoelastic.  A 2.4 millimeter keratome was used to make a near-clear corneal incision at the 8:00 position.  A curvilinear capsulorrhexis was made with a cystotome and capsulorrhexis forceps.  Balanced salt solution was used to hydrodissect and hydrodelineate the nucleus.   Phacoemulsification was then used in stop and chop fashion to  remove the lens nucleus and epinucleus.  The remaining cortex was then removed using the irrigation and aspiration handpiece. Viscoelastic was then placed into the capsular bag to distend it for lens placement.  A lens was then injected into the capsular bag.  The remaining viscoelastic was aspirated.  The lens was rotated with guidance from the Clements system.   Wounds were hydrated with balanced salt solution.  The anterior chamber was inflated to a physiologic pressure with balanced salt solution. The lens was well centered.  Intracameral vigamox 0.1 mL undiluted was injected into the eye and a drop placed onto the ocular surface.  No wound leaks were noted.  The patient was taken to the recovery room in stable condition without complications of anesthesia or surgery  Benay Pillow 07/16/2022, 11:48 AM

## 2022-07-16 NOTE — Transfer of Care (Signed)
Immediate Anesthesia Transfer of Care Note  Patient: Gabrielle Malone  Procedure(s) Performed: CATARACT EXTRACTION PHACO AND INTRAOCULAR LENS PLACEMENT (IOC) RIGHT  EYHANCE TORIC LENS  2.47  00:35.1 (Right: Eye)  Patient Location: PACU  Anesthesia Type: MAC  Level of Consciousness: awake, alert  and patient cooperative  Airway and Oxygen Therapy: Patient Spontanous Breathing and Patient connected to supplemental oxygen  Post-op Assessment: Post-op Vital signs reviewed, Patient's Cardiovascular Status Stable, Respiratory Function Stable, Patent Airway and No signs of Nausea or vomiting  Post-op Vital Signs: Reviewed and stable  Complications: No notable events documented.

## 2022-07-16 NOTE — Anesthesia Postprocedure Evaluation (Signed)
Anesthesia Post Note  Patient: Gabrielle Malone  Procedure(s) Performed: CATARACT EXTRACTION PHACO AND INTRAOCULAR LENS PLACEMENT (IOC) RIGHT  EYHANCE TORIC LENS  2.47  00:35.1 (Right: Eye)  Patient location during evaluation: PACU Anesthesia Type: MAC Level of consciousness: awake and alert Pain management: pain level controlled Vital Signs Assessment: post-procedure vital signs reviewed and stable Respiratory status: spontaneous breathing, nonlabored ventilation, respiratory function stable and patient connected to nasal cannula oxygen Cardiovascular status: stable and blood pressure returned to baseline Postop Assessment: no apparent nausea or vomiting Anesthetic complications: no   No notable events documented.   Last Vitals:  Vitals:   07/16/22 1150 07/16/22 1154  BP: (!) 114/98 133/89  Pulse: 61 62  Resp: 14 11  Temp: (!) 36.3 C (!) 36.3 C  SpO2: 98% 98%    Last Pain:  Vitals:   07/16/22 1154  TempSrc:   PainSc: 0-No pain                 Molli Barrows

## 2022-07-16 NOTE — H&P (Signed)
[facility]   Primary Care Physician:  Maryland Pink, MD Ophthalmologist: Dr. Benay Pillow  Pre-Procedure History & Physical: HPI:  Gabrielle Malone is a 67 y.o. female here for cataract surgery.   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 Authorizing Provider  alendronate (FOSAMAX) 70 MG tablet Take 70 mg by mouth once a week. Take with a full glass of water on an empty stomach.   Yes [provider]  amphetamine-dextroamphetamine (ADDERALL) 10 MG tablet Take 10 mg by mouth daily with breakfast. Can take up to twice daily 09/14/20  Yes [provider]  cyanocobalamin (,VITAMIN B-12,) 1000 MCG/ML injection Inject 1,000 mcg into the muscle every 30 (thirty) days.   Yes [provider]  propranolol (INDERAL) 20 MG tablet Take 20 mg by mouth every morning.   Yes  [provider]  sertraline (ZOLOFT) 100 MG tablet Take 100 mg by mouth every morning.   Yes [provider]  zolpidem (AMBIEN) 10 MG tablet Take 10 mg by mouth at bedtime. 04/08/17  Yes [provider]    Allergies as of 06/11/2022   (No Known Allergies)    Family History  Problem Relation Age of Onset   Dementia Mother    Cancer Father        lung,,melanoma   Dementia Maternal Grandmother    Breast cancer Maternal Grandmother     Social History   Socioeconomic History   Marital status: Married    Spouse name: Not on file   Number of children: Not on file   Years of education: Not on file   Highest education level: Not on file  Occupational History   Not on file  Tobacco Use   Smoking status: Never   Smokeless tobacco: Never  Vaping Use   Vaping Use: Never used  Substance and Sexual Activity   Alcohol use: Yes    Comment: nightly wine   Drug use: No   Sexual activity: Not on file  Other Topics Concern   Not on file  Social History Narrative   Not on file   Social Determinants of Health   Financial Resource Strain: Not on file  Food Insecurity: Not on file  Transportation Needs: Not on file  Physical Activity: Not on file  Stress: Not on file  Social Connections: Not on file  Intimate Partner Violence: Not on file    Review of Systems: See HPI, otherwise negative ROS  Physical Exam: BP (!) 135/91   Pulse 68   Temp (!) 97.4 F (36.3 C) (Temporal)   Resp 17   Ht 5\' 1"  (1.549 m)   Wt 56.2 kg   SpO2 100%   BMI 23.43 kg/m  General:   Alert, cooperative in NAD Head:  Normocephalic and atraumatic. Respiratory:  Normal work of breathing. Cardiovascular:  RRR  Impression/Plan: Gabrielle Malone is here for cataract surgery.  Risks, benefits, limitations, and alternatives regarding cataract surgery have been reviewed with the patient.  Questions have been answered.  All parties agreeable.   Benay Pillow, MD  07/16/2022, 11:19  AM

## 2022-07-17 ENCOUNTER — Encounter: Payer: Self-pay | Admitting: Ophthalmology

## 2022-07-24 ENCOUNTER — Encounter: Payer: Self-pay | Admitting: Ophthalmology

## 2022-07-26 ENCOUNTER — Emergency Department (HOSPITAL_COMMUNITY): Payer: BC Managed Care – PPO

## 2022-07-26 ENCOUNTER — Emergency Department (HOSPITAL_COMMUNITY)
Admission: EM | Admit: 2022-07-26 | Discharge: 2022-07-26 | Disposition: A | Discharge status: Home / Self Care | Payer: BC Managed Care – PPO | Attending: Emergency Medicine | Admitting: Emergency Medicine

## 2022-07-26 ENCOUNTER — Encounter (HOSPITAL_COMMUNITY): Payer: Self-pay

## 2022-07-26 ENCOUNTER — Other Ambulatory Visit: Payer: Self-pay

## 2022-07-26 DIAGNOSIS — D72819 Decreased white blood cell count, unspecified: Secondary | ICD-10-CM | POA: Diagnosis not present

## 2022-07-26 DIAGNOSIS — S0990XA Unspecified injury of head, initial encounter: Secondary | ICD-10-CM

## 2022-07-26 DIAGNOSIS — S0081XA Abrasion of other part of head, initial encounter: Secondary | ICD-10-CM | POA: Insufficient documentation

## 2022-07-26 DIAGNOSIS — R000 Tachycardia, unspecified: Secondary | ICD-10-CM | POA: Diagnosis not present

## 2022-07-26 DIAGNOSIS — W19XXXA Unspecified fall, initial encounter: Secondary | ICD-10-CM | POA: Diagnosis not present

## 2022-07-26 LAB — CBC WITH DIFFERENTIAL/PLATELET
Abs Immature Granulocytes: 0.01 10*3/uL (ref 0.00–0.07)
Abs Immature Granulocytes: 0.01 10*3/uL (ref 0.00–0.07)
Basophils Absolute: 0 10*3/uL (ref 0.0–0.1)
Basophils Absolute: 0.1 10*3/uL (ref 0.0–0.1)
Basophils Relative: 1 %
Basophils Relative: 1 %
Eosinophils Absolute: 0 10*3/uL (ref 0.0–0.5)
Eosinophils Absolute: 0.1 10*3/uL (ref 0.0–0.5)
Eosinophils Relative: 1 %
Eosinophils Relative: 3 %
HCT: 41.4 % (ref 36.0–46.0)
HCT: 40.7 % (ref 36.0–46.0)
Hemoglobin: 13.5 g/dL (ref 12.0–15.0)
Hemoglobin: 13.7 g/dL (ref 12.0–15.0)
Immature Granulocytes: 1 %
Immature Granulocytes: 0 %
Lymphocytes Relative: 53 %
Lymphocytes Relative: 28 %
Lymphs Abs: 0.8 10*3/uL (ref 0.7–4.0)
Lymphs Abs: 1.6 10*3/uL (ref 0.7–4.0)
MCH: 31.8 pg (ref 26.0–34.0)
MCH: 31.6 pg (ref 26.0–34.0)
MCHC: 32.6 g/dL (ref 30.0–36.0)
MCHC: 33.7 g/dL (ref 30.0–36.0)
MCV: 97.6 fL (ref 80.0–100.0)
MCV: 94 fL (ref 80.0–100.0)
Monocytes Absolute: 0.1 10*3/uL (ref 0.1–1.0)
Monocytes Absolute: 0.5 10*3/uL (ref 0.1–1.0)
Monocytes Relative: 5 %
Monocytes Relative: 8 %
Neutro Abs: 0.6 10*3/uL — ABNORMAL LOW (ref 1.7–7.7)
Neutro Abs: 3.5 10*3/uL (ref 1.7–7.7)
Neutrophils Relative %: 39 %
Neutrophils Relative %: 60 %
Platelets: 37 10*3/uL — ABNORMAL LOW (ref 150–400)
Platelets: 257 10*3/uL (ref 150–400)
RBC: 4.24 MIL/uL (ref 3.87–5.11)
RBC: 4.33 MIL/uL (ref 3.87–5.11)
RDW: 13.2 % (ref 11.5–15.5)
RDW: 13 % (ref 11.5–15.5)
WBC: 1.5 10*3/uL — ABNORMAL LOW (ref 4.0–10.5)
WBC: 5.7 10*3/uL (ref 4.0–10.5)
nRBC: 0 % (ref 0.0–0.2)
nRBC: 0 % (ref 0.0–0.2)

## 2022-07-26 LAB — RAPID URINE DRUG SCREEN, HOSP PERFORMED
Amphetamines: NOT DETECTED
Barbiturates: NOT DETECTED
Benzodiazepines: NOT DETECTED
Cocaine: NOT DETECTED
Opiates: NOT DETECTED
Tetrahydrocannabinol: NOT DETECTED

## 2022-07-26 LAB — URINALYSIS, ROUTINE W REFLEX MICROSCOPIC
Bilirubin Urine: NEGATIVE
Glucose, UA: NEGATIVE mg/dL
Hgb urine dipstick: NEGATIVE
Ketones, ur: NEGATIVE mg/dL
Leukocytes,Ua: NEGATIVE
Nitrite: NEGATIVE
Protein, ur: NEGATIVE mg/dL
Specific Gravity, Urine: 1.004 — ABNORMAL LOW (ref 1.005–1.030)
pH: 5 (ref 5.0–8.0)

## 2022-07-26 LAB — COMPREHENSIVE METABOLIC PANEL
ALT: 18 U/L (ref 0–44)
AST: 24 U/L (ref 15–41)
Albumin: 3.5 g/dL (ref 3.5–5.0)
Alkaline Phosphatase: 56 U/L (ref 38–126)
Anion gap: 10 (ref 5–15)
BUN: 19 mg/dL (ref 8–23)
CO2: 21 mmol/L — ABNORMAL LOW (ref 22–32)
Calcium: 8.3 mg/dL — ABNORMAL LOW (ref 8.9–10.3)
Chloride: 106 mmol/L (ref 98–111)
Creatinine, Ser: 0.62 mg/dL (ref 0.44–1.00)
GFR, Estimated: >60 mL/min (ref >60)
Glucose, Bld: 86 mg/dL (ref 70–99)
Potassium: 3.9 mmol/L (ref 3.5–5.1)
Sodium: 137 mmol/L (ref 135–145)
Total Bilirubin: 0.2 mg/dL — ABNORMAL LOW (ref 0.3–1.2)
Total Protein: 6 g/dL — ABNORMAL LOW (ref 6.5–8.1)

## 2022-07-26 LAB — TROPONIN I (HIGH SENSITIVITY)
Troponin I (High Sensitivity): 4 ng/L (ref <18)
Troponin I (High Sensitivity): 3 ng/L (ref <18)

## 2022-07-26 LAB — ETHANOL: Alcohol, Ethyl (B): 82 mg/dL — ABNORMAL HIGH (ref <10)

## 2022-07-26 MED ORDER — LACTATED RINGERS IV BOLUS
1000.0000 mL | Freq: Once | INTRAVENOUS | Status: AC
  Administered 2022-07-26: 1000 mL via INTRAVENOUS

## 2022-07-26 MED ORDER — IOHEXOL 350 MG/ML SOLN
75.0000 mL | Freq: Once | INTRAVENOUS | Status: AC | PRN
  Administered 2022-07-26: 75 mL via INTRAVENOUS

## 2022-07-26 NOTE — ED Triage Notes (Signed)
Pt bib acems from home. Pt fell unsure if patient fell at top of steps or at bottom there was 1 flight of stairs with 13 steps Pt unable to follow directions upon ems arrival. Responds to voice.  Bp first 136/78  Second 106/71 100% ra 70s 167 cbg

## 2022-07-26 NOTE — Discharge Instructions (Addendum)
Your testing shows no serious injury from your fall.  X-rays and CT scans are negative.  You do have a lung nodule that should be followed up in 1 year with a repeat CT scan to ensure this is stable.  Your blood work today showed a low white blood cell count of 1.5.  This may be from a viral infection but you should follow-up with your doctor for further assessment of this.  You may need to see a hematologist. Reduce your alcohol intake.  Follow-up with your primary doctor.  Return to the ED with new or worsening symptoms.

## 2022-07-26 NOTE — ED Provider Notes (Signed)
[facility] EMERGENCY DEPARTMENT Provider Note   CSN: [id number] Arrival date & time: 07/26/22  5027     History  No chief complaint on file.   Gabrielle Malone is a 67 y.o. female.  Patient brought in by EMS after apparent fall versus syncope.  This was unwitnessed.  She was found at the bottom of a flight of stairs.  On EMS arrival she was slow to respond and oriented to person and place only.  It was reported she took Ambien this evening as well as maybe had some alcohol.  She is found to have an abrasion hematoma to her scalp but no other obvious injuries.  Patient denies any pain.  She denies feeling dizziness or lightheadedness.  Denies chest pain, shortness of breath, abdominal pain, nausea or vomiting.  Does not remember falling.  Does not recall what happened.  She is oriented to person and place.  She knows the month is January.  Denies head, neck, back pain.  No blood thinner use.  The history is provided by the patient and the EMS personnel. The history is limited by the condition of the patient.       Home Medications Prior to Admission medications   Medication Sig Start Date End Date Taking? Authorizing Provider  alendronate (FOSAMAX) 70 MG tablet Take 70 mg by mouth once a week. Take with a full glass of water on an empty stomach.    [provider]  amphetamine-dextroamphetamine (ADDERALL) 10 MG tablet Take 10 mg by mouth daily with breakfast. Can take up to twice daily 09/14/20   [provider]  cyanocobalamin (,VITAMIN B-12,) 1000 MCG/ML injection Inject 1,000 mcg into the muscle every 30 (thirty) days.    [provider]  propranolol (INDERAL) 20 MG tablet Take 20 mg by mouth every morning.    [provider]  sertraline (ZOLOFT) 100 MG tablet Take 100 mg by mouth every morning.    [provider]  zolpidem (AMBIEN) 10 MG tablet Take 10 mg by mouth at bedtime. 04/08/17   [provider]       Allergies    Patient has no known allergies.    Review of Systems   Review of Systems  Constitutional:  Negative for activity change and appetite change.  HENT:  Negative for congestion.   Respiratory:  Negative for cough and chest tightness.   Cardiovascular:  Negative for chest pain.  Gastrointestinal:  Negative for abdominal pain, nausea and vomiting.  Genitourinary:  Negative for dysuria and pelvic pain.  Musculoskeletal:  Negative for arthralgias, back pain and myalgias.  Skin:  Negative for rash.  Neurological:  Negative for dizziness, weakness and light-headedness.   all other systems are negative except as noted in the HPI and PMH.    Physical Exam Updated Vital Signs There were no vitals taken for this visit. Physical Exam Vitals and nursing note reviewed.  Constitutional:      General: She is not in acute distress.    Appearance: She is well-developed.     Comments: Oriented to person, January 2024  HENT:     Head: Normocephalic.     Comments: Abrasion left forehead    Mouth/Throat:     Pharynx: No oropharyngeal exudate.  Eyes:     Conjunctiva/sclera: Conjunctivae normal.     Pupils: Pupils are equal, round, and reactive to light.  Neck:     Comments: No meningismus. Cardiovascular:     Rate and Rhythm: Regular rhythm.  Tachycardia present.     Heart sounds: Normal heart sounds. No murmur heard. Pulmonary:     Effort: Pulmonary effort is normal. No respiratory distress.     Breath sounds: Normal breath sounds.  Chest:     Chest wall: No tenderness.  Abdominal:     Palpations: Abdomen is soft.     Tenderness: There is no abdominal tenderness. There is no guarding or rebound.  Musculoskeletal:        General: No tenderness. Normal range of motion.     Cervical back: Normal range of motion and neck supple.     Comments: Full range of motion of hips without pain.  Skin:    General: Skin is warm.  Neurological:     General: No focal deficit present.      Mental Status: She is alert and oriented to person, place, and time. Mental status is at baseline.     Cranial Nerves: No cranial nerve deficit.     Motor: No abnormal muscle tone.     Coordination: Coordination normal.     Comments: No ataxia on finger to nose bilaterally. No pronator drift. 5/5 strength throughout. CN 2-12 intact.Equal grip strength. Sensation intact.   Psychiatric:        Behavior: Behavior normal.     ED Results / Procedures / Treatments   Labs (all labs ordered are listed, but only abnormal results are displayed) Labs Reviewed  CBC WITH DIFFERENTIAL/PLATELET - Abnormal; Notable for the following components:      Result Value   WBC 1.5 (*)    Platelets 37 (*)    Neutro Abs 0.6 (*)    All other components within normal limits  ETHANOL - Abnormal; Notable for the following components:   Alcohol, Ethyl (B) 82 (*)    All other components within normal limits  URINALYSIS, ROUTINE W REFLEX MICROSCOPIC - Abnormal; Notable for the following components:   Color, Urine COLORLESS (*)    Specific Gravity, Urine 1.004 (*)    All other components within normal limits  COMPREHENSIVE METABOLIC PANEL - Abnormal; Notable for the following components:   CO2 21 (*)    Calcium 8.3 (*)    Total Protein 6.0 (*)    Total Bilirubin 0.2 (*)    All other components within normal limits  RAPID URINE DRUG SCREEN, HOSP PERFORMED  CBC WITH DIFFERENTIAL/PLATELET  TROPONIN I (HIGH SENSITIVITY)  TROPONIN I (HIGH SENSITIVITY)    EKG EKG Interpretation  Date/Time:  Thursday July 26 2022 00:48:14 EST Ventricular Rate:  70 PR Interval:  141 QRS Duration: 96 QT Interval:  431 QTC Calculation: 466 R Axis:   -22 Text Interpretation: Sinus rhythm Borderline left axis deviation No significant change was found Confirmed by Glynn Octave [phone]) on 07/26/2022 1:36:41 AM  Radiology DG Chest Portable 1 View  Result Date: 07/26/2022 CLINICAL DATA:  Fall EXAM: PORTABLE CHEST 1 VIEW  COMPARISON:  05/23/2021 FINDINGS: The heart size and mediastinal contours are within normal limits. Both lungs are clear. The visualized skeletal structures are unremarkable. IMPRESSION: No active disease. Electronically Signed   By: Charlett Nose M.D.   On: 07/26/2022 00:58   DG Pelvis Portable  Result Date: 07/26/2022 CLINICAL DATA:  Fall EXAM: PORTABLE PELVIS 1-2 VIEWS COMPARISON:  08/11/2015 FINDINGS: There is no evidence of pelvic fracture or diastasis. No pelvic bone lesions are seen. IMPRESSION: Negative. Electronically Signed   By: Charlett Nose M.D.   On: 07/26/2022 00:58    Procedures Procedures  Medications Ordered in ED Medications - No data to display  ED Course/ Medical Decision Making/ A&P                             Medical Decision Making Amount and/or Complexity of Data Reviewed Independent Historian: EMS Labs: ordered. Decision-making details documented in ED Course. Radiology: ordered and independent interpretation performed. Decision-making details documented in ED Course. ECG/medicine tests: ordered and independent interpretation performed. Decision-making details documented in ED Course.  Risk Prescription drug management.   Fall versus possible syncope.  Vital signs stable, mild tachycardia.  No distress.  No obvious injuries.  GCS is 14, ABCs are intact.  Patient does not recall what happened.  Family is not here.  She states she has fallen previously when tripped by her cat but denies this happening today.  States she did take Ambien this evening.  Denies any alcohol ingestion.  EKG is normal sinus rhythm.  No acute ST changes.  No Brugada or prolonged QT. Flipped T wave in lead III which is stable.  Family has arrived.  They heard patient fall but did not see it.  She had been going up and down the stairs doing laundry.  Family states anytime she takes her Ambien she becomes disoriented and tries to do other things.  Labs do show leukopenia of uncertain  significance.  White blood cell count 1.5.  Ethanol level 82. Patient and family made aware of leukopenia as well as lung nodule on imaging.  Traumatic imaging is negative for significant injury.  No fractures or intracranial, intrathoracic or intra-abdominal injury.  Suspect likely mechanical fall though cannot rule out syncope.  Patient now believes that she is tripped at the top of the stairs.  Did have Ambien and alcohol on board.  Discussed we cannot rule out syncope but she has had recurrent falls in the past due to alcohol use. orthostatics are negative.  Patient ambulatory without difficulty or dizziness.  She is tolerating p.o.  Discussed most conservative approach would be to admit her for syncope observation with echocardiogram and carotid Dopplers.  She would much prefer to go home. She is able to tolerate p.o. and ambulate.  Patient to follow-up with PCP as well as hematology.  Encouraged to reduce her alcohol intake.  Return precautions discussed.  Repeat CBC pending and patient does not want to wait for results.  She states she will check results on MyChart.  Discussed her leukopenia should be followed up by her PCP as well as hematology.        Final Clinical Impression(s) / ED Diagnoses Final diagnoses:  Fall, initial encounter  Injury of head, initial encounter  Leukopenia, unspecified type    Rx / DC Orders ED Discharge Orders     None         Jennalynn Rivard, Jeannett Senior, MD 07/26/22 [phone]

## 2022-07-26 NOTE — ED Notes (Signed)
Chemistry recollected and sent to lab due to clotting.

## 2022-07-27 NOTE — Discharge Instructions (Signed)
   Cataract Surgery, Care After ? ?This sheet gives you information about how to care for yourself after your surgery.  Your ophthalmologist may also give you more specific instructions.  If you have problems or questions, contact your doctor at [facility], [phone]. ? ?What can I expect after the surgery? ?It is common to have: ?Itching ?Foreign body sensation (feels like a grain of sand in the eye) ?Watery discharge (excess tearing) ?Sensitivity to light and touch ?Bruising in or around the eye ?Mild blurred vision ? ?Follow these instructions at home: ?Do not touch or rub your eyes. ?You may be told to wear a protective shield or sunglasses to protect your eyes. ?Do not put a contact lens in the operative eye unless your doctor approves. ?Keep the lids and face clean and dry. ?Do not allow water to hit you directly in the face while showering. ?Keep soap and shampoo out of your eyes. ?Do not use eye makeup for 1 week. ? ?Check your eye every day for signs of infection.  Watch for: ?Redness, swelling, or pain. ?Fluid, blood or pus. ?Worsening vision. ?Worsening sensitivity to light or touch. ? ?Activity: ?During the first day, avoid bending over and reading.  You may resume reading and bending the next day. ?Do not drive or use heavy machinery for at least 24 hours. ?Avoid strenuous activities for 1 week.  Activities such as walking, treadmill, exercise bike, and climbing stairs are okay. ?Do not lift heavy (>20 pound) objects for 1 week. ?Do not do yardwork, gardening, or dirty housework (mopping, cleaning bathrooms, vacuuming, etc.) for 1 week. ?Do not swim or use a hot tub for 2 weeks. ?Ask your doctor when you can return to work. ? ?General Instructions: ?Take or apply prescription and over-the-counter medicines as directed by your doctor, including eyedrops and ointments. ?Resume medications discontinued prior to surgery, unless told otherwise by your doctor. ?Keep all follow up appointments as  scheduled. ? ?Contact a health care provider if: ?You have increased bruising around your eye. ?You have pain that is not helped with medication. ?You have a fever. ?You have fluid, pus, or blood coming from your eye or incision. ?Your sensitivity to light gets worse. ?You have spots (floaters) of flashing lights in your vision. ?You have nausea or vomiting. ? ?Go to the nearest emergency room or call 911 if: ?You have sudden loss of vision. ?You have severe, worsening eye pain. ? ?

## 2022-07-30 ENCOUNTER — Ambulatory Visit: Payer: BC Managed Care – PPO | Admitting: Anesthesiology

## 2022-07-30 ENCOUNTER — Encounter: Payer: Self-pay | Admitting: Ophthalmology

## 2022-07-30 ENCOUNTER — Ambulatory Visit
Admission: RE | Admit: 2022-07-30 | Discharge: 2022-07-30 | Disposition: A | Discharge status: Home / Self Care | Payer: BC Managed Care – PPO | Attending: Ophthalmology | Admitting: Ophthalmology

## 2022-07-30 ENCOUNTER — Other Ambulatory Visit: Payer: Self-pay

## 2022-07-30 ENCOUNTER — Encounter
Admission: RE | Disposition: A | Discharge status: Home / Self Care | Payer: Self-pay | Source: Home / Self Care | Attending: Ophthalmology

## 2022-07-30 DIAGNOSIS — Z9884 Bariatric surgery status: Secondary | ICD-10-CM | POA: Insufficient documentation

## 2022-07-30 DIAGNOSIS — F32A Depression, unspecified: Secondary | ICD-10-CM | POA: Insufficient documentation

## 2022-07-30 DIAGNOSIS — F419 Anxiety disorder, unspecified: Secondary | ICD-10-CM | POA: Insufficient documentation

## 2022-07-30 DIAGNOSIS — H2512 Age-related nuclear cataract, left eye: Secondary | ICD-10-CM | POA: Insufficient documentation

## 2022-07-30 DIAGNOSIS — E119 Type 2 diabetes mellitus without complications: Secondary | ICD-10-CM

## 2022-07-30 HISTORY — PX: CATARACT EXTRACTION W/PHACO: SHX586

## 2022-07-30 SURGERY — PHACOEMULSIFICATION, CATARACT, WITH IOL INSERTION
Anesthesia: Monitor Anesthesia Care | Site: Eye | Laterality: Left

## 2022-07-30 MED ORDER — SIGHTPATH DOSE#1 BSS IO SOLN
INTRAOCULAR | Status: DC | PRN
  Administered 2022-07-30: 115 mL via OPHTHALMIC

## 2022-07-30 MED ORDER — LACTATED RINGERS IV SOLN: INTRAVENOUS | Status: DC

## 2022-07-30 MED ORDER — ARMC OPHTHALMIC DILATING DROPS
1.0000 | OPHTHALMIC | Status: DC | PRN
  Administered 2022-07-30 (×3): 1 via OPHTHALMIC

## 2022-07-30 MED ORDER — FENTANYL CITRATE (PF) 100 MCG/2ML IJ SOLN
INTRAMUSCULAR | Status: DC | PRN
  Administered 2022-07-30 (×2): 50 ug via INTRAVENOUS

## 2022-07-30 MED ORDER — TETRACAINE HCL 0.5 % OP SOLN
1.0000 [drp] | OPHTHALMIC | Status: DC | PRN
  Administered 2022-07-30 (×3): 1 [drp] via OPHTHALMIC

## 2022-07-30 MED ORDER — MIDAZOLAM HCL 2 MG/2ML IJ SOLN
INTRAMUSCULAR | Status: DC | PRN
  Administered 2022-07-30 (×2): 1 mg via INTRAVENOUS

## 2022-07-30 MED ORDER — MOXIFLOXACIN HCL 0.5 % OP SOLN
OPHTHALMIC | Status: DC | PRN
  Administered 2022-07-30: .2 mL via OPHTHALMIC

## 2022-07-30 MED ORDER — SIGHTPATH DOSE#1 NA HYALUR & NA CHOND-NA HYALUR IO KIT
PACK | INTRAOCULAR | Status: DC | PRN
  Administered 2022-07-30: 1 via OPHTHALMIC

## 2022-07-30 MED ORDER — ACETAMINOPHEN 160 MG/5ML PO SOLN: 325.0000 mg | ORAL | Status: AC | PRN

## 2022-07-30 MED ORDER — ACETAMINOPHEN 325 MG PO TABS
650.0000 mg | ORAL_TABLET | Freq: Once | ORAL | Status: AC | PRN
  Administered 2022-07-30: 650 mg via ORAL

## 2022-07-30 MED ORDER — ONDANSETRON HCL 4 MG/2ML IJ SOLN: 4.0000 mg | Freq: Once | INTRAMUSCULAR | Status: DC | PRN

## 2022-07-30 MED ORDER — SIGHTPATH DOSE#1 BSS IO SOLN
INTRAOCULAR | Status: DC | PRN
  Administered 2022-07-30: 15 mL

## 2022-07-30 MED ORDER — LIDOCAINE HCL (PF) 2 % IJ SOLN
INTRAOCULAR | Status: DC | PRN
  Administered 2022-07-30: 1 mL via INTRAOCULAR

## 2022-07-30 SURGICAL SUPPLY — 15 items
CATARACT SUITE SIGHTPATH (MISCELLANEOUS) ×1 IMPLANT
DISSECTOR HYDRO NUCLEUS 50X22 (MISCELLANEOUS) ×1 IMPLANT
FEE CATARACT SUITE SIGHTPATH (MISCELLANEOUS) ×1 IMPLANT
GLOVE SURG GAMMEX PI TX LF 7.5 (GLOVE) ×1 IMPLANT
GLOVE SURG SYN 8.5  E (GLOVE) ×1
GLOVE SURG SYN 8.5 E (GLOVE) ×1 IMPLANT
GLOVE SURG SYN 8.5 PF PI (GLOVE) ×1 IMPLANT
LENS IOL EYHANCE TORIC II 25.0 ×1 IMPLANT
LENS IOL EYHANCE TRC 450 25.0 IMPLANT
LENS IOL EYHNC TORIC 450 25.0 ×1 IMPLANT
NDL FILTER BLUNT 18X1 1/2 (NEEDLE) ×1 IMPLANT
NEEDLE FILTER BLUNT 18X1 1/2 (NEEDLE) ×1 IMPLANT
SYR 3ML LL SCALE MARK (SYRINGE) ×1 IMPLANT
SYR 5ML LL (SYRINGE) ×1 IMPLANT
WATER STERILE IRR 250ML POUR (IV SOLUTION) ×1 IMPLANT

## 2022-07-30 NOTE — Transfer of Care (Signed)
Immediate Anesthesia Transfer of Care Note  Patient: Gabrielle Malone  Procedure(s) Performed: CATARACT EXTRACTION PHACO AND INTRAOCULAR LENS PLACEMENT (IOC) LEFT  EYHANCE TORIC LENS  4.35  00:37.1 (Left: Eye)  Patient Location: PACU  Anesthesia Type: MAC  Level of Consciousness: awake, alert  and patient cooperative  Airway and Oxygen Therapy: Patient Spontanous Breathing and Patient connected to supplemental oxygen  Post-op Assessment: Post-op Vital signs reviewed, Patient's Cardiovascular Status Stable, Respiratory Function Stable, Patent Airway and No signs of Nausea or vomiting  Post-op Vital Signs: Reviewed and stable  Complications: No notable events documented.

## 2022-07-30 NOTE — Anesthesia Postprocedure Evaluation (Signed)
Anesthesia Post Note  Patient: Gabrielle Malone  Procedure(s) Performed: CATARACT EXTRACTION PHACO AND INTRAOCULAR LENS PLACEMENT (IOC) LEFT  EYHANCE TORIC LENS  4.35  00:37.1 (Left: Eye)  Patient location during evaluation: PACU Anesthesia Type: MAC Level of consciousness: awake and alert, oriented and patient cooperative Pain management: pain level controlled Vital Signs Assessment: post-procedure vital signs reviewed and stable Respiratory status: spontaneous breathing, nonlabored ventilation and respiratory function stable Cardiovascular status: blood pressure returned to baseline and stable Postop Assessment: adequate PO intake Anesthetic complications: no   No notable events documented.   Last Vitals:  Vitals:   07/30/22 1003 07/30/22 1009  BP: 126/86 128/74  Pulse: (!) 54 (!) 55  Resp: 14 10  Temp: (!) 36.3 C (!) 36.3 C  SpO2: 98% 97%    Last Pain:  Vitals:   07/30/22 1009  PainSc: 0-No pain                 Darrin Nipper

## 2022-07-30 NOTE — Op Note (Signed)
OPERATIVE NOTE  Gabrielle Malone [id number] 07/30/2022   PREOPERATIVE DIAGNOSIS:  Nuclear sclerotic cataract left eye.  H25.12   POSTOPERATIVE DIAGNOSIS:    Nuclear sclerotic cataract left eye.     PROCEDURE:  Phacoemusification with posterior chamber intraocular lens placement of the left eye   LENS:   Implant Name Type Inv. Item Serial No. Manufacturer Lot No. LRB No. Used Action  LENS IOL EYHANCE TORIC II 25.0 - [id number]  LENS IOL EYHANCE TORIC II 25.0 [id number] [employer]  Left 1 Implanted      Procedure(s): CATARACT EXTRACTION PHACO AND INTRAOCULAR LENS PLACEMENT (IOC) LEFT  EYHANCE TORIC LENS  4.35  00:37.1 (Left)  [id number] +25.0   ULTRASOUND TIME: 0 minutes 37 seconds.  CDE 4.35   SURGEON:  Willey Blade, MD, MPH   ANESTHESIA:  Topical with tetracaine drops augmented with 1% preservative-free intracameral lidocaine.  ESTIMATED BLOOD LOSS: <1 mL   COMPLICATIONS:  None.   DESCRIPTION OF PROCEDURE:  The patient was identified in the holding room and transported to the operating room and placed in the supine position under the operating microscope.  The left eye was identified as the operative eye and it was prepped and draped in the usual sterile ophthalmic fashion.  The verion system was registered without difficulty.   A 1.0 millimeter clear-corneal paracentesis was made at the 5:00 position. 0.5 ml of preservative-free 1% lidocaine with epinephrine was injected into the anterior chamber.  The anterior chamber was filled with viscoelastic.  A 2.4 millimeter keratome was used to make a near-clear corneal incision at the 2:00 position.  A curvilinear capsulorrhexis was made with a cystotome and capsulorrhexis forceps.  Balanced salt solution was used to hydrodissect and hydrodelineate the nucleus.   Phacoemulsification was then used in stop and chop fashion to remove the lens nucleus and epinucleus.  The remaining cortex was then removed using the irrigation and aspiration  handpiece. Viscoelastic was then placed into the capsular bag to distend it for lens placement.  A lens was then injected into the capsular bag.  The remaining viscoelastic was aspirated.  The lens was rotated with guidance from the verion system.   Wounds were hydrated with balanced salt solution.  The anterior chamber was inflated to a physiologic pressure with balanced salt solution.  Intracameral vigamox 0.1 mL undiltued was injected into the eye and a drop placed onto the ocular surface.  No wound leaks were noted.  The patient was taken to the recovery room in stable condition without complications of anesthesia or surgery  Willey Blade 07/30/2022, 10:02 AM

## 2022-07-30 NOTE — H&P (Signed)
[facility]   Primary Care Physician:  Jerl Mina, MD Ophthalmologist: Dr. Willey Blade  Pre-Procedure History & Physical: HPI:  Marcia Hartwell is a 67 y.o. female here for cataract surgery.   Past Medical History:  Diagnosis Date   Alcohol abuse    Anxiety    Complication of anesthesia    Remembers part of surgery from rhinoplasty and woke up during another surgery   Depression    Endometriosis    GERD (gastroesophageal reflux disease)    Insomnia     Past Surgical History:  Procedure Laterality Date   carpal tunnel     CATARACT EXTRACTION W/PHACO Right 07/16/2022   Procedure: CATARACT EXTRACTION PHACO AND INTRAOCULAR LENS PLACEMENT (IOC) RIGHT  EYHANCE TORIC LENS  2.47  00:35.1;  Surgeon: Nevada Crane, MD;  Location: 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 Authorizing Provider  alendronate (FOSAMAX) 70 MG tablet Take 70 mg by mouth once a week. Take with a full glass of water on an empty stomach.   Yes [provider]  amphetamine-dextroamphetamine (ADDERALL) 10 MG tablet Take 10 mg by mouth daily with breakfast. Can take up to  twice daily 09/14/20  Yes [provider]  cyanocobalamin (,VITAMIN B-12,) 1000 MCG/ML injection Inject 1,000 mcg into the muscle every 30 (thirty) days.   Yes [provider]  propranolol (INDERAL) 20 MG tablet Take 20 mg by mouth every morning.   Yes [provider]  sertraline (ZOLOFT) 100 MG tablet Take 100 mg by mouth every morning.   Yes [provider]  zolpidem (AMBIEN) 10 MG tablet Take 10 mg by mouth at bedtime. 04/08/17  Yes [provider]    Allergies as of 06/11/2022   (No Known Allergies)    Family History  Problem Relation Age of Onset   Dementia Mother    Cancer Father        lung,,melanoma   Dementia Maternal Grandmother    Breast cancer Maternal Grandmother     Social History   Socioeconomic History   Marital status: Married    Spouse name: Not on file   Number of children: Not on file   Years of education: Not on file   Highest education level: Not on file  Occupational History   Not on file  Tobacco Use   Smoking status: Never   Smokeless tobacco: Never  Vaping Use   Vaping Use: Never used  Substance and Sexual Activity   Alcohol use: Yes    Comment: nightly wine   Drug use: No   Sexual activity: Not on file  Other Topics Concern   Not on file  Social  History Narrative   Not on file   Social Determinants of Health   Financial Resource Strain: Not on file  Food Insecurity: Not on file  Transportation Needs: Not on file  Physical Activity: Not on file  Stress: Not on file  Social Connections: Not on file  Intimate Partner Violence: Not on file    Review of Systems: See HPI, otherwise negative ROS  Physical Exam: BP 122/78   Temp (!) 97.4 F (36.3 C)   Ht 5' 0.98" (1.549 m)   Wt 57.2 kg   SpO2 98%   BMI 23.82 kg/m  General:   Alert, cooperative in NAD Head:  Normocephalic and atraumatic. Respiratory:  Normal work of breathing. Cardiovascular:  RRR  Impression/Plan: Nilam Quakenbush is  here for cataract surgery.  Risks, benefits, limitations, and alternatives regarding cataract surgery have been reviewed with the patient.  Questions have been answered.  All parties agreeable.   Willey Blade, MD  07/30/2022, 9:24 AM

## 2022-07-30 NOTE — Anesthesia Preprocedure Evaluation (Addendum)
Anesthesia Evaluation  Patient identified by MRN, date of birth, ID band Patient awake    Reviewed: Allergy & Precautions, NPO status , Patient's Chart, lab work & pertinent test results  History of Anesthesia Complications Negative for: history of anesthetic complications  Airway Mallampati: I   Neck ROM: Full    Dental no notable dental hx.    Pulmonary neg pulmonary ROS   Pulmonary exam normal breath sounds clear to auscultation       Cardiovascular Exercise Tolerance: Good negative cardio ROS Normal cardiovascular exam Rhythm:Regular Rate:Normal     Neuro/Psych  PSYCHIATRIC DISORDERS Anxiety Depression    negative neurological ROS     GI/Hepatic negative GI ROS,,,S/p gastric bypass   Endo/Other  negative endocrine ROS    Renal/GU negative Renal ROS     Musculoskeletal   Abdominal   Peds  Hematology negative hematology ROS (+)   Anesthesia Other Findings   Reproductive/Obstetrics Endometriosis                              Anesthesia Physical Anesthesia Plan  ASA: 2  Anesthesia Plan: MAC   Post-op Pain Management:    Induction: Intravenous  PONV Risk Score and Plan: 2 and Treatment may vary due to age or medical condition, Midazolam and TIVA  Airway Management Planned: Natural Airway and Nasal Cannula  Additional Equipment:   Intra-op Plan:   Post-operative Plan:   Informed Consent: I have reviewed the patients History and Physical, chart, labs and discussed the procedure including the risks, benefits and alternatives for the proposed anesthesia with the patient or authorized representative who has indicated his/her understanding and acceptance.     Dental advisory given  Plan Discussed with: CRNA  Anesthesia Plan Comments: (LMA/GETA backup discussed.  Patient consented for risks of anesthesia including but not limited to:  - adverse reactions to medications -  damage to eyes, teeth, lips or other oral mucosa - nerve damage due to positioning  - sore throat or hoarseness - damage to heart, brain, nerves, lungs, other parts of body or loss of life  Informed patient about role of CRNA in peri- and intra-operative care.  Patient voiced understanding.)       Anesthesia Quick Evaluation

## 2022-07-31 ENCOUNTER — Telehealth: Payer: Self-pay | Admitting: Internal Medicine

## 2022-07-31 NOTE — Telephone Encounter (Signed)
Contacted patient to scheduled appointments. Left message with appointment details and a call back number if patient had any questions or could not accommodate the time we provided.

## 2022-08-01 ENCOUNTER — Encounter: Payer: Self-pay | Admitting: Ophthalmology

## 2022-08-03 ENCOUNTER — Other Ambulatory Visit: Payer: Self-pay

## 2022-08-03 DIAGNOSIS — R911 Solitary pulmonary nodule: Secondary | ICD-10-CM

## 2022-08-05 ENCOUNTER — Telehealth: Payer: Self-pay | Admitting: Internal Medicine

## 2022-08-06 ENCOUNTER — Inpatient Hospital Stay: Payer: BC Managed Care – PPO | Admitting: Internal Medicine

## 2022-08-06 ENCOUNTER — Inpatient Hospital Stay (HOSPITAL_BASED_OUTPATIENT_CLINIC_OR_DEPARTMENT_OTHER): Payer: BC Managed Care – PPO | Admitting: Internal Medicine

## 2022-08-06 ENCOUNTER — Inpatient Hospital Stay: Payer: BC Managed Care – PPO | Attending: Internal Medicine

## 2022-08-06 ENCOUNTER — Other Ambulatory Visit: Payer: Self-pay | Admitting: Medical Oncology

## 2022-08-06 VITALS — BP 140/87 | HR 78 | Temp 97.8°F | Resp 15 | Ht 61.0 in | Wt 124.1 lb

## 2022-08-06 DIAGNOSIS — C3490 Malignant neoplasm of unspecified part of unspecified bronchus or lung: Secondary | ICD-10-CM

## 2022-08-06 DIAGNOSIS — K219 Gastro-esophageal reflux disease without esophagitis: Secondary | ICD-10-CM | POA: Insufficient documentation

## 2022-08-06 DIAGNOSIS — Z9884 Bariatric surgery status: Secondary | ICD-10-CM | POA: Diagnosis not present

## 2022-08-06 DIAGNOSIS — R911 Solitary pulmonary nodule: Secondary | ICD-10-CM

## 2022-08-06 DIAGNOSIS — F32A Depression, unspecified: Secondary | ICD-10-CM | POA: Insufficient documentation

## 2022-08-06 DIAGNOSIS — F419 Anxiety disorder, unspecified: Secondary | ICD-10-CM | POA: Insufficient documentation

## 2022-08-06 DIAGNOSIS — Z79899 Other long term (current) drug therapy: Secondary | ICD-10-CM | POA: Insufficient documentation

## 2022-08-06 DIAGNOSIS — G4700 Insomnia, unspecified: Secondary | ICD-10-CM | POA: Insufficient documentation

## 2022-08-06 DIAGNOSIS — R918 Other nonspecific abnormal finding of lung field: Secondary | ICD-10-CM | POA: Diagnosis not present

## 2022-08-06 LAB — COMPREHENSIVE METABOLIC PANEL
ALT: 18 U/L (ref 0–44)
AST: 19 U/L (ref 15–41)
Albumin: 4.2 g/dL (ref 3.5–5.0)
Alkaline Phosphatase: 68 U/L (ref 38–126)
Anion gap: 6 (ref 5–15)
BUN: 22 mg/dL (ref 8–23)
CO2: 27 mmol/L (ref 22–32)
Calcium: 9.3 mg/dL (ref 8.9–10.3)
Chloride: 104 mmol/L (ref 98–111)
Creatinine, Ser: 0.93 mg/dL (ref 0.44–1.00)
GFR, Estimated: >60 mL/min (ref >60)
Glucose, Bld: 93 mg/dL (ref 70–99)
Potassium: 4.5 mmol/L (ref 3.5–5.1)
Sodium: 137 mmol/L (ref 135–145)
Total Bilirubin: 0.4 mg/dL (ref 0.3–1.2)
Total Protein: 7.2 g/dL (ref 6.5–8.1)

## 2022-08-06 LAB — CBC WITH DIFFERENTIAL/PLATELET
Abs Immature Granulocytes: 0 10*3/uL (ref 0.00–0.07)
Basophils Absolute: 0.1 10*3/uL (ref 0.0–0.1)
Basophils Relative: 1 %
Eosinophils Absolute: 0.1 10*3/uL (ref 0.0–0.5)
Eosinophils Relative: 2 %
HCT: 43.2 % (ref 36.0–46.0)
Hemoglobin: 14.4 g/dL (ref 12.0–15.0)
Immature Granulocytes: 0 %
Lymphocytes Relative: 24 %
Lymphs Abs: 1.5 10*3/uL (ref 0.7–4.0)
MCH: 31.3 pg (ref 26.0–34.0)
MCHC: 33.3 g/dL (ref 30.0–36.0)
MCV: 93.9 fL (ref 80.0–100.0)
Monocytes Absolute: 0.7 10*3/uL (ref 0.1–1.0)
Monocytes Relative: 11 %
Neutro Abs: 3.9 10*3/uL (ref 1.7–7.7)
Neutrophils Relative %: 62 %
Platelets: 257 10*3/uL (ref 150–400)
RBC: 4.6 MIL/uL (ref 3.87–5.11)
RDW: 13.2 % (ref 11.5–15.5)
WBC: 6.3 10*3/uL (ref 4.0–10.5)
nRBC: 0 % (ref 0.0–0.2)

## 2022-08-06 NOTE — Progress Notes (Signed)
[place] Telephone:([phone]   Fax:([phone]) [phone]  CONSULT NOTE  REFERRING PHYSICIAN: Dr. Maryland Pink  REASON FOR CONSULTATION:  67 years old white female with pulmonary nodule  HPI Gabrielle Malone is a 67 y.o. female with past medical history significant for anxiety/depression, endometriosis, GERD, gastric bypass surgery as well as history of alcohol abuse.  The patient mentions that 2 weeks ago on July 26, 2022 she had a fall at home and she presented to the emergency department for evaluation.  During her evaluation she had repeat CBC that likely by error showed total white blood count of 1.5 with absolute neutrophil count of 600 and [place] 96[address].  This was likely a lab error since repeating the CBC on the same day 3 hours later showed the total white blood count of 5.7 with hemoglobin of 13.7 and hematocrit 40.7% and platelets count 257,000.  During her evaluation the patient had CT scanning of the chest, abdomen and pelvis on July 26, 2022 and that showed no acute findings or evidence of significant traumatic injury in the chest, abdomen and pelvis but there was a small left pulmonary nodules measuring up to 0.4 centimeter.  The recommendation was to repeat imaging studies in 1 year if there is any concern.  Unfortunately the patient has a family history of lung cancer as her father had both small cell and non-small cell lung cancer in addition to multiple family members with melanoma.  She was more anxious about this finding and she is requested to be evaluated by oncology.  When seen today she is feeling fine except for fatigue.  She also very anxious about her scan results.  She denied having any chest pain, shortness of breath but recently has some sore throat.  She has no nausea, vomiting, diarrhea or constipation.  She has no headache or visual changes.  She has no recent weight loss or night sweats. Family history significant for mother with  dementia and melanoma.  Father had history of non-small cell lung cancer, small cell lung cancer as well as melanoma and he died in his 44s. The patient is married and has 2 daughters age 82 and 55.  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  She has no history for smoking but drinks 1 to 2 glasses of alcohol every night.  She has no history of drug abuse.  HPI  Past Medical History:  Diagnosis Date   Alcohol abuse    Anxiety    Complication of anesthesia    Remembers part of surgery from rhinoplasty and woke up during another surgery   Depression    Endometriosis    GERD (gastroesophageal reflux disease)    Insomnia     Past Surgical History:  Procedure Laterality Date   carpal tunnel     CATARACT EXTRACTION W/PHACO Right 07/16/2022   Procedure: CATARACT EXTRACTION PHACO AND INTRAOCULAR LENS PLACEMENT ([place]) RIGHT  EYHANCE TORIC LENS  2.47  00:35.1;  Surgeon: Eulogio Bear, MD;  Location: [place];  Service: Ophthalmology;  Laterality: Right;   CATARACT EXTRACTION W/PHACO Left 07/30/2022   Procedure: CATARACT EXTRACTION PHACO AND INTRAOCULAR LENS PLACEMENT ([facility]) LEFT  EYHANCE TORIC LENS  4.35  00:37.1;  Surgeon: Eulogio Bear, MD;  Location: [place];  Service: Ophthalmology;  Laterality: Left;   CESAREAN SECTION     COLONOSCOPY WITH PROPOFOL N/A 05/25/2022   Procedure: COLONOSCOPY WITH  PROPOFOL;  Surgeon: Lin Landsman, MD;  Location[facility];  Service: Gastroenterology;  Laterality: N/A;   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   Procedure: DUPUYTREN CONTRACTURE RELEASE RIGHT HAND AND RELEASE OF RIGHT RING TRIGGER FINGER;  Surgeon: Corky Mull, MD;  Location: [facility];  Service: Orthopedics;  Laterality: Right;   GASTRIC BYPASS     OOPHORECTOMY     RHINOPLASTY     TRIGGER FINGER RELEASE Right 10/13/2020   Procedure: RIGHT MIDDLE FINGER TRIGGER RELEASE WITH DEBRIDEMENT OF EARLY  DUPUYTREN'S CONTRACTURE;  Surgeon: Corky Mull, MD;  Location: [facility];  Service: Orthopedics;  Laterality: Right;    Family History  Problem Relation Age of Onset   Dementia Mother    Cancer Father        lung,,melanoma   Dementia Maternal Grandmother    Breast cancer Maternal Grandmother     Social History Social History   Tobacco Use   Smoking status: Never   Smokeless tobacco: Never  Vaping Use   Vaping Use: Never used  Substance Use Topics   Alcohol use: Yes    Comment: nightly wine   Drug use: No    No Known Allergies  Current Outpatient Medications  Medication Sig Dispense Refill   alendronate (FOSAMAX) 70 MG tablet Take 70 mg by mouth once a week. Take with a full glass of water on an empty stomach.     amphetamine-dextroamphetamine (ADDERALL) 10 MG tablet Take 10 mg by mouth daily with breakfast. Can take up to twice daily     cyanocobalamin (,VITAMIN B-12,) 1000 MCG/ML injection Inject 1,000 mcg into the muscle every 30 (thirty) days.     propranolol (INDERAL) 20 MG tablet Take 20 mg by mouth every morning.     sertraline (ZOLOFT) 100 MG tablet Take 100 mg by mouth every morning.     zolpidem (AMBIEN) 10 MG tablet Take 10 mg by mouth at bedtime.  0   No current facility-administered medications for this visit.    Review of Systems  Constitutional: positive for fatigue Eyes: negative Ears, nose, mouth, throat, and face: negative Respiratory: negative Cardiovascular: negative Gastrointestinal: negative Genitourinary:negative Integument/breast: negative Hematologic/lymphatic: negative Musculoskeletal:negative Neurological: negative Behavioral/Psych: positive for anxiety Endocrine: negative Allergic/Immunologic: negative  PERFORMANCE STATUS: ECOG 1  LABORATORY DATA: Lab Results  Component Value Date   WBC 6.3 08/06/2022   HGB 14.4 08/06/2022   HCT 43.2 08/06/2022   MCV 93.9 08/06/2022   PLT 257 08/06/2022      Chemistry      Component  Value Date/Time   NA 137 07/26/2022 0247   K 3.9 07/26/2022 0247   CL 106 07/26/2022 0247   CO2 21 (L) 07/26/2022 0247   BUN 19 07/26/2022 0247   CREATININE 0.62 07/26/2022 0247      Component Value Date/Time   CALCIUM 8.3 (L) 07/26/2022 0247   ALKPHOS 56 07/26/2022 0247   AST 24 07/26/2022 0247   ALT 18 07/26/2022 0247   BILITOT 0.2 (L) 07/26/2022 0247       RADIOGRAPHIC STUDIES: CT CHEST ABDOMEN PELVIS W CONTRAST  Result Date: 07/26/2022 CLINICAL DATA:  Abdominal trauma, blunt.  Fall. EXAM: CT CHEST, ABDOMEN, AND PELVIS WITH CONTRAST TECHNIQUE: Multidetector CT imaging of the chest, abdomen and pelvis was performed following the standard protocol during bolus administration of intravenous contrast. RADIATION DOSE REDUCTION: This exam was performed according to the departmental dose-optimization program which includes automated exposure control, adjustment of the mA and/or kV according to patient size  and/or use of iterative reconstruction technique. CONTRAST:  47mL OMNIPAQUE IOHEXOL 350 MG/ML SOLN COMPARISON:  05/23/2021 FINDINGS: CT CHEST FINDINGS Cardiovascular: Heart is normal size. Aorta is normal caliber. Mediastinum/Nodes: No mediastinal, hilar, or axillary adenopathy. Trachea and esophagus are unremarkable. Thyroid unremarkable. Lungs/Pleura: Minimal dependent atelectasis. No effusions or pneumothorax. Several small 2 mm nodules in the left lower lobe. 4 mm nodule in the left upper lobe. Musculoskeletal: Chest wall soft tissues are unremarkable. No acute bony abnormality. CT ABDOMEN PELVIS FINDINGS Hepatobiliary: No focal hepatic abnormality. Gallbladder unremarkable. Pancreas: No focal abnormality or ductal dilatation. Spleen: No splenic injury or perisplenic hematoma. Adrenals/Urinary Tract: No adrenal hemorrhage or renal injury identified. Bladder is unremarkable. Stomach/Bowel: Postoperative changes from prior gastric bypass. No evidence of bowel obstruction. Vascular/Lymphatic: No  evidence of aneurysm or adenopathy. Reproductive: Uterus and adnexa unremarkable.  No mass. Other: No free fluid or free air. Musculoskeletal: No acute bony abnormality. IMPRESSION: No acute findings or evidence of significant traumatic injury in the chest, abdomen or pelvis. Small left pulmonary nodules measuring up to 4 mm. No follow-up needed if patient is low-risk (and has no known or suspected primary neoplasm). Non-contrast chest CT can be considered in 12 months if patient is high-risk. This recommendation follows the consensus statement: Guidelines for Management of Incidental Pulmonary Nodules Detected on CT Images: From the Fleischner Society 2017; Radiology 2017; 284:228-243. Electronically Signed   By: Rolm Baptise M.D.   On: 07/26/2022 03:55   CT Head Wo Contrast  Result Date: 07/26/2022 CLINICAL DATA:  Fall EXAM: CT HEAD WITHOUT CONTRAST CT CERVICAL SPINE WITHOUT CONTRAST TECHNIQUE: Multidetector CT imaging of the head and cervical spine was performed following the standard protocol without intravenous contrast. Multiplanar CT image reconstructions of the cervical spine were also generated. RADIATION DOSE REDUCTION: This exam was performed according to the departmental dose-optimization program which includes automated exposure control, adjustment of the mA and/or kV according to patient size and/or use of iterative reconstruction technique. COMPARISON:  05/23/2021 CT head and cervical spine FINDINGS: CT HEAD FINDINGS Brain: No evidence of acute infarct, hemorrhage, mass, mass effect, or midline shift. No hydrocephalus or extra-axial fluid collection. Periventricular white matter changes, likely the sequela of chronic small vessel ischemic disease. Normal cerebral volume for age. Vascular: No hyperdense vessel. Skull: Normal. Negative for fracture or focal lesion. Sinuses/Orbits: No acute finding. Status post right lens replacement. Other: The mastoid air cells are well aerated. CT CERVICAL SPINE  FINDINGS Alignment: Trace anterolisthesis of C3 on C4 and C4 on C5, unchanged. No traumatic listhesis. Skull base and vertebrae: No acute fracture. No primary bone lesion or focal pathologic process. Soft tissues and spinal canal: No prevertebral fluid or swelling. No visible canal hematoma. Disc levels: Degenerative changes in the cervical spine, with disc height loss most prominent at C5-C6. Mild spinal canal stenosis at C5-C6 and C6-C7. Multilevel facet and uncovertebral hypertrophy. Upper chest: Please see same-day CT chest. Other: None. IMPRESSION: 1. No acute intracranial process. 2. No acute fracture or traumatic listhesis in the cervical spine. Electronically Signed   By: Merilyn Baba M.D.   On: 07/26/2022 03:55   CT Cervical Spine Wo Contrast  Result Date: 07/26/2022 CLINICAL DATA:  Fall EXAM: CT HEAD WITHOUT CONTRAST CT CERVICAL SPINE WITHOUT CONTRAST TECHNIQUE: Multidetector CT imaging of the head and cervical spine was performed following the standard protocol without intravenous contrast. Multiplanar CT image reconstructions of the cervical spine were also generated. RADIATION DOSE REDUCTION: This exam was performed according to the departmental  dose-optimization program which includes automated exposure control, adjustment of the mA and/or kV according to patient size and/or use of iterative reconstruction technique. COMPARISON:  05/23/2021 CT head and cervical spine FINDINGS: CT HEAD FINDINGS Brain: No evidence of acute infarct, hemorrhage, mass, mass effect, or midline shift. No hydrocephalus or extra-axial fluid collection. Periventricular white matter changes, likely the sequela of chronic small vessel ischemic disease. Normal cerebral volume for age. Vascular: No hyperdense vessel. Skull: Normal. Negative for fracture or focal lesion. Sinuses/Orbits: No acute finding. Status post right lens replacement. Other: The mastoid air cells are well aerated. CT CERVICAL SPINE FINDINGS Alignment: Trace  anterolisthesis of C3 on C4 and C4 on C5, unchanged. No traumatic listhesis. Skull base and vertebrae: No acute fracture. No primary bone lesion or focal pathologic process. Soft tissues and spinal canal: No prevertebral fluid or swelling. No visible canal hematoma. Disc levels: Degenerative changes in the cervical spine, with disc height loss most prominent at C5-C6. Mild spinal canal stenosis at C5-C6 and C6-C7. Multilevel facet and uncovertebral hypertrophy. Upper chest: Please see same-day CT chest. Other: None. IMPRESSION: 1. No acute intracranial process. 2. No acute fracture or traumatic listhesis in the cervical spine. Electronically Signed   By: Merilyn Baba M.D.   On: 07/26/2022 03:55   DG Chest Portable 1 View  Result Date: 07/26/2022 CLINICAL DATA:  Fall EXAM: PORTABLE CHEST 1 VIEW COMPARISON:  05/23/2021 FINDINGS: The heart size and mediastinal contours are within normal limits. Both lungs are clear. The visualized skeletal structures are unremarkable. IMPRESSION: No active disease. Electronically Signed   By: Rolm Baptise M.D.   On: 07/26/2022 00:58   DG Pelvis Portable  Result Date: 07/26/2022 CLINICAL DATA:  Fall EXAM: PORTABLE PELVIS 1-2 VIEWS COMPARISON:  08/11/2015 FINDINGS: There is no evidence of pelvic fracture or diastasis. No pelvic bone lesions are seen. IMPRESSION: Negative. Electronically Signed   By: Rolm Baptise M.D.   On: 07/26/2022 00:58    ASSESSMENT: This is a very pleasant 67 years old white female presented for evaluation of few pulmonary nodules that are subcentimeter in size and no more than 4 mm each that were detected on CT scan of the chest, abdomen and pelvis performed recently.  She also had leukocytopenia but this was a lab error.   PLAN: I had a lengthy discussion with the patient today about her current condition and further evaluation to rule out any underlying malignancy. I personally and independently reviewed the scan images and shared the images with  the patient today. Her CT scan of the chest showed few pulmonary nodules especially on the left lung in the range of 2-4 mm in size and these are likely to be inflammatory in origin but early low-grade adenocarcinoma of the lung could not be completely excluded especially with the patient's family history. To be on the safe side I recommended for the patient to have repeat CT scan of the chest in 3 months and if negative she will be released to follow-up with her primary care physician but if this nodule continues to increase in size or more nodules develop, we will refer her to pulmonary medicine for evaluation and close monitoring. The patient is in agreement with the current plan. She was also advised to cut on the amount of alcohol she drinks at regular basis. The patient was advised to call immediately if she has any other concerning symptoms in the interval. The patient voices understanding of current disease status and treatment options and is  in agreement with the current care plan.  All questions were answered. The patient knows to call the clinic with any problems, questions or concerns. We can certainly see the patient much sooner if necessary.  Thank you so much for allowing me to participate in the care of Gabrielle Malone. I will continue to follow up the patient with you and assist in her care.  The total time spent in the appointment was 60 minutes.  Disclaimer: This note was dictated with voice recognition software. Similar sounding words can inadvertently be transcribed and may not be corrected upon review.   Eilleen Kempf August 06, 2022, 2:20 PM

## 2022-08-07 ENCOUNTER — Inpatient Hospital Stay: Payer: BC Managed Care – PPO | Admitting: Licensed Clinical Social Worker

## 2022-08-07 DIAGNOSIS — C3490 Malignant neoplasm of unspecified part of unspecified bronchus or lung: Secondary | ICD-10-CM

## 2022-08-07 NOTE — Progress Notes (Signed)
[facility] CSW Progress Note  Clinical Education officer, museum met with patient at medical provider's request.  Pt will be following w/ Dr. Julien Nordmann for surveillance.  Pt informed CSW she is struggling at home with her spouse.  According to pt she had spoken with the [facility] last week and was anticipating a call back, but had not heard from anyone.  CSW contacted the [facility] on behalf of pt and spoke with Malachy Moan who confirmed they are working with pt, but had instructed her to contact them.  Latonya verbalized agreement to reach out to pt today and will advise her on next steps.  CSW also sent an email to Legal Aide explaining pt's situation to inquire if any advise could be given to her regarding her legal rights in her current situation.  Pt's contact information given to Legal Aide.    Henriette Combs, LCSW

## 2022-08-15 ENCOUNTER — Telehealth: Payer: Self-pay | Admitting: Internal Medicine

## 2022-08-15 NOTE — Telephone Encounter (Signed)
Scheduled per 01/29 los, patient has been called and notified.

## 2022-08-23 ENCOUNTER — Ambulatory Visit
Admission: RE | Admit: 2022-08-23 | Discharge: 2022-08-23 | Disposition: A | Discharge status: Home / Self Care | Payer: BC Managed Care – PPO | Source: Ambulatory Visit | Attending: Family Medicine | Admitting: Family Medicine

## 2022-08-23 DIAGNOSIS — Z1231 Encounter for screening mammogram for malignant neoplasm of breast: Secondary | ICD-10-CM | POA: Insufficient documentation

## 2022-09-17 ENCOUNTER — Other Ambulatory Visit: Payer: Self-pay | Admitting: Gastroenterology

## 2022-09-20 ENCOUNTER — Telehealth: Payer: Self-pay | Admitting: Internal Medicine

## 2022-09-20 NOTE — Telephone Encounter (Signed)
Called patient regarding April appointments, left a voicemail.

## 2022-11-01 ENCOUNTER — Ambulatory Visit (HOSPITAL_COMMUNITY)
Admission: RE | Admit: 2022-11-01 | Discharge: 2022-11-01 | Disposition: A | Discharge status: Home / Self Care | Payer: Medicare Other | Source: Ambulatory Visit | Attending: Internal Medicine | Admitting: Internal Medicine

## 2022-11-01 ENCOUNTER — Other Ambulatory Visit: Payer: Self-pay

## 2022-11-01 ENCOUNTER — Inpatient Hospital Stay: Payer: Medicare Other | Attending: Internal Medicine

## 2022-11-01 DIAGNOSIS — C3490 Malignant neoplasm of unspecified part of unspecified bronchus or lung: Secondary | ICD-10-CM

## 2022-11-01 DIAGNOSIS — R918 Other nonspecific abnormal finding of lung field: Secondary | ICD-10-CM | POA: Insufficient documentation

## 2022-11-01 LAB — CBC WITH DIFFERENTIAL (CANCER CENTER ONLY)
Abs Immature Granulocytes: 0.01 10*3/uL (ref 0.00–0.07)
Basophils Absolute: 0.1 10*3/uL (ref 0.0–0.1)
Basophils Relative: 1 %
Eosinophils Absolute: 0.2 10*3/uL (ref 0.0–0.5)
Eosinophils Relative: 3 %
HCT: 40.1 % (ref 36.0–46.0)
Hemoglobin: 13.5 g/dL (ref 12.0–15.0)
Immature Granulocytes: 0 %
Lymphocytes Relative: 19 %
Lymphs Abs: 1 10*3/uL (ref 0.7–4.0)
MCH: 31.8 pg (ref 26.0–34.0)
MCHC: 33.7 g/dL (ref 30.0–36.0)
MCV: 94.6 fL (ref 80.0–100.0)
Monocytes Absolute: 0.5 10*3/uL (ref 0.1–1.0)
Monocytes Relative: 10 %
Neutro Abs: 3.6 10*3/uL (ref 1.7–7.7)
Neutrophils Relative %: 67 %
Platelet Count: 238 10*3/uL (ref 150–400)
RBC: 4.24 MIL/uL (ref 3.87–5.11)
RDW: 14.3 % (ref 11.5–15.5)
WBC Count: 5.4 10*3/uL (ref 4.0–10.5)
nRBC: 0 % (ref 0.0–0.2)

## 2022-11-01 LAB — CMP (CANCER CENTER ONLY)
ALT: 15 U/L (ref 0–44)
AST: 19 U/L (ref 15–41)
Albumin: 4.3 g/dL (ref 3.5–5.0)
Alkaline Phosphatase: 59 U/L (ref 38–126)
Anion gap: 4 — ABNORMAL LOW (ref 5–15)
BUN: 15 mg/dL (ref 8–23)
CO2: 32 mmol/L (ref 22–32)
Calcium: 9.6 mg/dL (ref 8.9–10.3)
Chloride: 105 mmol/L (ref 98–111)
Creatinine: 0.68 mg/dL (ref 0.44–1.00)
GFR, Estimated: >60 mL/min (ref >60)
Glucose, Bld: 93 mg/dL (ref 70–99)
Potassium: 4.7 mmol/L (ref 3.5–5.1)
Sodium: 141 mmol/L (ref 135–145)
Total Bilirubin: 0.5 mg/dL (ref 0.3–1.2)
Total Protein: 7.1 g/dL (ref 6.5–8.1)

## 2022-11-01 MED ORDER — IOHEXOL 300 MG/ML  SOLN
75.0000 mL | Freq: Once | INTRAMUSCULAR | Status: AC | PRN
  Administered 2022-11-01: 75 mL via INTRAVENOUS

## 2022-11-05 ENCOUNTER — Ambulatory Visit: Payer: BC Managed Care – PPO | Admitting: Internal Medicine

## 2022-11-05 ENCOUNTER — Inpatient Hospital Stay (HOSPITAL_BASED_OUTPATIENT_CLINIC_OR_DEPARTMENT_OTHER): Payer: Medicare Other | Admitting: Internal Medicine

## 2022-11-05 VITALS — BP 148/96 | HR 111 | Temp 97.3°F | Resp 16 | Wt 127.2 lb

## 2022-11-05 DIAGNOSIS — R918 Other nonspecific abnormal finding of lung field: Secondary | ICD-10-CM | POA: Diagnosis present

## 2022-11-05 NOTE — Progress Notes (Signed)
[facility] [phone]   [phone]  OFFICE PROGRESS NOTE  Jerl Mina, MD [address] [facility] [place] [address]  DIAGNOSIS: pulmonary nodules that are subcentimeter in size and no more than 4 mm each that were detected on CT scan of the chest, abdomen and pelvis   PRIOR THERAPY: None  CURRENT THERAPY: None  INTERVAL HISTORY: Gabrielle Malone 67 y.o. female returns to the clinic today for follow-up visit.  The patient is feeling fine today with no concerning complaints.  She denied having any current chest pain, shortness of breath except with exertion with no cough or hemoptysis.  She has no nausea, vomiting, diarrhea or constipation.  She has no headache or visual changes.  She denied having any recent weight loss or night sweats.  She is here today for evaluation with repeat CT scan of the chest for evaluation of the pulmonary nodules that were seen on previous imaging studies.  MEDICAL HISTORY: Past Medical History:  Diagnosis Date   Alcohol abuse    Anxiety    Complication of anesthesia    Remembers part of surgery from rhinoplasty and woke up during another surgery   Depression    Endometriosis    GERD (gastroesophageal reflux disease)    Insomnia     ALLERGIES:  has No Known Allergies.  MEDICATIONS:  Current Outpatient Medications  Medication Sig Dispense Refill   alendronate (FOSAMAX) 70 MG tablet Take 70 mg by mouth once a week. Take with a full glass of water on an empty stomach.     amphetamine-dextroamphetamine (ADDERALL) 10 MG tablet Take 10 mg by mouth daily with breakfast. Can take up to twice daily     cyanocobalamin (,VITAMIN B-12,) 1000 MCG/ML injection Inject 1,000 mcg into the muscle every 30 (thirty) days.     propranolol (INDERAL) 20 MG tablet Take 20 mg by mouth every morning.     sertraline (ZOLOFT) 100 MG tablet Take 100 mg by mouth every morning.     zolpidem (AMBIEN) 10 MG tablet Take 10 mg  by mouth at bedtime.  0   No current facility-administered medications for this visit.    SURGICAL HISTORY:  Past Surgical History:  Procedure Laterality Date   carpal tunnel     CATARACT EXTRACTION W/PHACO Right 07/16/2022   Procedure: CATARACT EXTRACTION PHACO AND INTRAOCULAR LENS PLACEMENT (IOC) RIGHT  EYHANCE TORIC LENS  2.47  00:35.1;  Surgeon: Nevada Crane, MD;  Location: [facility];  Service: Ophthalmology;  Laterality: Right;   CATARACT EXTRACTION W/PHACO Left 07/30/2022   Procedure: CATARACT EXTRACTION PHACO AND INTRAOCULAR LENS PLACEMENT (IOC) LEFT  EYHANCE TORIC LENS  4.35  00:37.1;  Surgeon: Nevada Crane, MD;  Location: [facility];  Service: Ophthalmology;  Laterality: Left;   CESAREAN SECTION     COLONOSCOPY WITH PROPOFOL N/A 05/25/2022   Procedure: COLONOSCOPY WITH PROPOFOL;  Surgeon: Toney Reil, MD;  Location: [facility];  Service: Gastroenterology;  Laterality: N/A;   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   PHYSICAL EXAMINATION: General appearance: alert, cooperative, and no distress Head: Normocephalic, without obvious abnormality, atraumatic Neck: no adenopathy, no JVD, supple, symmetrical, trachea midline, and thyroid not enlarged, symmetric, no tenderness/mass/nodules Lymph nodes: Cervical, supraclavicular, and axillary nodes normal. Resp: clear to auscultation bilaterally Back:  symmetric, no curvature. ROM normal. No CVA tenderness. Cardio: regular rate and rhythm, S1, S2 normal, no murmur, click, rub or gallop GI: soft, non-tender; bowel sounds normal; no masses,  no organomegaly Extremities: extremities normal, atraumatic, no cyanosis or edema  ECOG PERFORMANCE STATUS: 1 - Symptomatic but completely ambulatory  Blood pressure (!) 148/96, pulse (!) 111, temperature (!) 97.3 F (36.3 C), temperature source Temporal, resp. rate 16, weight 127 lb 3.2 oz (57.7 kg), SpO2 99 %.  LABORATORY DATA: Lab Results  Component Value Date   WBC 5.4 11/01/2022   HGB 13.5 11/01/2022   HCT 40.1 11/01/2022   MCV 94.6 11/01/2022   PLT 238 11/01/2022      Chemistry      Component Value Date/Time   NA 141 11/01/2022 1016   K 4.7 11/01/2022 1016   CL 105 11/01/2022 1016   CO2 32 11/01/2022 1016   BUN 15 11/01/2022 1016   CREATININE 0.68 11/01/2022 1016      Component Value Date/Time   CALCIUM 9.6 11/01/2022 1016   ALKPHOS 59 11/01/2022 1016   AST 19 11/01/2022 1016   ALT 15 11/01/2022 1016   BILITOT 0.5 11/01/2022 1016       RADIOGRAPHIC STUDIES: CT Chest W Contrast  Result Date: 11/05/2022 CLINICAL DATA:  Staging non-small-cell lung cancer. * Tracking Code: BO * EXAM: CT CHEST WITH CONTRAST TECHNIQUE: Multidetector CT imaging of the chest was performed during intravenous contrast administration. RADIATION DOSE REDUCTION: This exam was performed according to the departmental dose-optimization program which includes automated exposure control, adjustment of the mA and/or kV according to patient size and/or use of iterative reconstruction technique. CONTRAST:  75mL OMNIPAQUE IOHEXOL 300 MG/ML  SOLN COMPARISON:  CT 07/26/2022 FINDINGS: Cardiovascular: Normal caliber thoracic aorta with slight atherosclerotic change. Bovine type aortic arch. Heart is nonenlarged. No pericardial effusion. Mediastinum/Nodes: Surgical changes from gastric bypass. Small hiatal hernia. Esophagus  is mildly patulous. The thyroid gland is preserved. No specific abnormal lymph node enlargement identified in the axillary region, hilum or mediastinum. Lungs/Pleura: No pneumothorax or effusion. There is new patchy ill-defined ground-glass seen in the inferior right upper lobe anteriorly. Infectious or inflammatory process possible. Recommend short follow-up. Mild areas of ground-glass as well in the middle lobe. There are some tiny lung nodules identified as on previous. Example left lower lobe on series 5, image 94 measuring 4 mm. This is unchanged from prior. Tiny nodules also seen in the superior segment of the middle lobe, example series 5, image 65, unchanged. Small focus more caudal measuring 3 mm on series 5, image 75 and middle lobe is also stable. The left upper lobe semi-solid nodule previously seen is no longer present. This may have been infectious or inflammatory. No new dominant lung nodule Upper Abdomen: Fatty liver infiltration seen in the upper abdomen. The adrenal glands are preserved. Again surgical changes from gastric bypass. Musculoskeletal: Scattered degenerative changes along the spine. IMPRESSION: Developing areas of ground-glass in the right upper lobe and minimal in the middle lobe. Possibilities include infectious or inflammatory process. Recommend short follow-up. Several tiny lung  nodules. Those seen today are stable from the previous examination. The focus in the left upper lobe measuring 4-5 mm is no longer seen. Overall recommend continued surveillance of the current nodules in 6 months. No developing new mass lesion or lymph node enlargement in the thorax. Fatty liver infiltration.  Gastric bypass Aortic Atherosclerosis (ICD10-I70.0). Electronically Signed   By: Karen Kays M.D.   On: 11/05/2022 11:43    ASSESSMENT AND PLAN: This is a very pleasant 67 years old white female with few subcentimeter pulmonary nodules likely inflammatory in origin but early malignancy could not be  completely excluded. The patient is currently on observation and she had repeat CT scan of the chest performed recently. I personally and independently reviewed the scan images and discussed results with the patient and her daughter Gabrielle Malone who was available by phone today.  Her scan showed no clear evidence for metastatic disease but she had developing areas of groundglass in the right upper lobe and minimal in the middle lobe highly suspicious for inflammatory process.  She had several stable tiny pulmonary nodules and the recommendation from radiology is to repeat scan in 6 months for further evaluation. I discussed the recommendation with the patient and she is in agreement with coming back for follow-up visit in 6 months. The patient was advised to call immediately if she has any other concerning symptoms in the interval. The patient voices understanding of current disease status and treatment options and is in agreement with the current care plan.  All questions were answered. The patient knows to call the clinic with any problems, questions or concerns. We can certainly see the patient much sooner if necessary.  The total time spent in the appointment was 20 minutes.  Disclaimer: This note was dictated with voice recognition software. Similar sounding words can inadvertently be transcribed and may not be corrected upon review.

## 2023-05-02 ENCOUNTER — Ambulatory Visit (HOSPITAL_COMMUNITY)
Admission: RE | Admit: 2023-05-02 | Discharge: 2023-05-02 | Disposition: A | Discharge status: Home / Self Care | Payer: Medicare Other | Source: Ambulatory Visit | Attending: Internal Medicine | Admitting: Internal Medicine

## 2023-05-02 ENCOUNTER — Inpatient Hospital Stay: Payer: Medicare Other | Attending: Internal Medicine

## 2023-05-02 DIAGNOSIS — R918 Other nonspecific abnormal finding of lung field: Secondary | ICD-10-CM | POA: Insufficient documentation

## 2023-05-02 LAB — CBC WITH DIFFERENTIAL (CANCER CENTER ONLY)
Abs Immature Granulocytes: 0.01 10*3/uL (ref 0.00–0.07)
Basophils Absolute: 0 10*3/uL (ref 0.0–0.1)
Basophils Relative: 1 %
Eosinophils Absolute: 0.1 10*3/uL (ref 0.0–0.5)
Eosinophils Relative: 3 %
HCT: 40.7 % (ref 36.0–46.0)
Hemoglobin: 13.6 g/dL (ref 12.0–15.0)
Immature Granulocytes: 0 %
Lymphocytes Relative: 25 %
Lymphs Abs: 1.2 10*3/uL (ref 0.7–4.0)
MCH: 30.6 pg (ref 26.0–34.0)
MCHC: 33.4 g/dL (ref 30.0–36.0)
MCV: 91.5 fL (ref 80.0–100.0)
Monocytes Absolute: 0.4 10*3/uL (ref 0.1–1.0)
Monocytes Relative: 9 %
Neutro Abs: 2.9 10*3/uL (ref 1.7–7.7)
Neutrophils Relative %: 62 %
Platelet Count: 277 10*3/uL (ref 150–400)
RBC: 4.45 MIL/uL (ref 3.87–5.11)
RDW: 13.6 % (ref 11.5–15.5)
WBC Count: 4.7 10*3/uL (ref 4.0–10.5)
nRBC: 0 % (ref 0.0–0.2)

## 2023-05-02 LAB — CMP (CANCER CENTER ONLY)
ALT: 21 U/L (ref 0–44)
AST: 26 U/L (ref 15–41)
Albumin: 4.1 g/dL (ref 3.5–5.0)
Alkaline Phosphatase: 62 U/L (ref 38–126)
Anion gap: 10 (ref 5–15)
BUN: 18 mg/dL (ref 8–23)
CO2: 24 mmol/L (ref 22–32)
Calcium: 9 mg/dL (ref 8.9–10.3)
Chloride: 103 mmol/L (ref 98–111)
Creatinine: 0.66 mg/dL (ref 0.44–1.00)
GFR, Estimated: >60 mL/min (ref >60)
Glucose, Bld: 165 mg/dL — ABNORMAL HIGH (ref 70–99)
Potassium: 4.2 mmol/L (ref 3.5–5.1)
Sodium: 137 mmol/L (ref 135–145)
Total Bilirubin: 0.9 mg/dL (ref 0.3–1.2)
Total Protein: 7.4 g/dL (ref 6.5–8.1)

## 2023-05-02 MED ORDER — IOHEXOL 300 MG/ML  SOLN
75.0000 mL | Freq: Once | INTRAMUSCULAR | Status: AC | PRN
  Administered 2023-05-02: 75 mL via INTRAVENOUS

## 2023-05-07 ENCOUNTER — Inpatient Hospital Stay (HOSPITAL_BASED_OUTPATIENT_CLINIC_OR_DEPARTMENT_OTHER): Payer: Medicare Other | Admitting: Internal Medicine

## 2023-05-07 VITALS — BP 139/85 | HR 109 | Temp 98.8°F | Resp 17 | Wt 123.3 lb

## 2023-05-07 DIAGNOSIS — R918 Other nonspecific abnormal finding of lung field: Secondary | ICD-10-CM | POA: Diagnosis not present

## 2023-05-07 NOTE — Progress Notes (Signed)
[facility] Telephone:([phone]   Fax:([phone]  OFFICE PROGRESS NOTE  Jerl Mina, MD [address] [facility] [place] [address]  DIAGNOSIS: pulmonary nodules that are subcentimeter in size and no more than 4 mm each that were detected on CT scan of the chest, abdomen and pelvis   PRIOR THERAPY: None  CURRENT THERAPY: None  INTERVAL HISTORY: Gabrielle Malone 67 y.o. female returns to the clinic today for follow-up visit.Discussed the use of AI scribe software for clinical note transcription with the patient, who gave verbal consent to proceed.  History of Present Illness   Gabrielle Malone, a 67 year old patient, has been under observation for multiple lung nodules on both sides. Over the past six months, the patient has been experiencing increased fatigue and a lack of energy. Despite this, she has remained active, potentially contributing to a weight loss of approximately four pounds over the same period.  The patient's daughter has noticed an increase in her coughing, although the patient was not initially aware of this change. There have been no other new respiratory symptoms such as chest pain or shortness of breath. The patient denies any post-nasal drainage.  The nodules remain small, with the largest measuring four millimeters in diameter, and are primarily located in the right lung.  In addition to the lung nodules, the patient has also been diagnosed with atherosclerosis. This has been attributed to the buildup of cholesterol and plaque over the years. The patient's management of this condition was not discussed in detail during the consultation but was advised to discuss with her PCP.      MEDICAL HISTORY: Past Medical History:  Diagnosis Date   Alcohol abuse    Anxiety    Complication of anesthesia    Remembers part of surgery from rhinoplasty and woke up during another surgery   Depression    Endometriosis    GERD  (gastroesophageal reflux disease)    Insomnia     ALLERGIES:  has No Known Allergies.  MEDICATIONS:  Current Outpatient Medications  Medication Sig Dispense Refill   alendronate (FOSAMAX) 70 MG tablet Take 70 mg by mouth once a week. Take with a full glass of water on an empty stomach.     amphetamine-dextroamphetamine (ADDERALL) 10 MG tablet Take 10 mg by mouth daily with breakfast. Can take up to twice daily     cyanocobalamin (,VITAMIN B-12,) 1000 MCG/ML injection Inject 1,000 mcg into the muscle every 30 (thirty) days.     propranolol (INDERAL) 20 MG tablet Take 20 mg by mouth every morning.     sertraline (ZOLOFT) 100 MG tablet Take 100 mg by mouth every morning.     zolpidem (AMBIEN) 10 MG tablet Take 10 mg by mouth at bedtime.  0   No current facility-administered medications for this visit.    SURGICAL HISTORY:  Past Surgical History:  Procedure Laterality Date   carpal tunnel     CATARACT EXTRACTION W/PHACO Right 07/16/2022   Procedure: CATARACT EXTRACTION PHACO AND INTRAOCULAR LENS PLACEMENT (IOC) RIGHT  EYHANCE TORIC LENS  2.47  00:35.1;  Surgeon: Nevada Crane, MD;  Location: [facility];  Service: Ophthalmology;  Laterality: Right;   CATARACT EXTRACTION W/PHACO Left 07/30/2022   Procedure: CATARACT EXTRACTION PHACO AND INTRAOCULAR LENS PLACEMENT (IOC) LEFT  EYHANCE TORIC LENS  4.35  00:37.1;  Surgeon: Nevada Crane, MD;  Location: [facility];  Service: Ophthalmology;  Laterality: Left;   CESAREAN SECTION  COLONOSCOPY WITH PROPOFOL N/A 05/25/2022   Procedure: COLONOSCOPY WITH PROPOFOL;  Surgeon: Toney Reil, MD;  Location: [facility];  Service: Gastroenterology;  Laterality: N/A;   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   Procedure: DUPUYTREN CONTRACTURE RELEASE RIGHT HAND AND RELEASE OF RIGHT RING TRIGGER FINGER;  Surgeon: Christena Flake, MD;  Location: [facility];  Service: Orthopedics;  Laterality: Right;   GASTRIC BYPASS      OOPHORECTOMY     RHINOPLASTY     TRIGGER FINGER RELEASE Right 10/13/2020   Procedure: RIGHT MIDDLE FINGER 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;  Surgeon: Christena Flake, MD;  Location: [facility];  Service: Orthopedics;  Laterality: Right;    REVIEW OF SYSTEMS:  A comprehensive review of systems was negative except for: Constitutional: positive for fatigue Respiratory: positive for cough   PHYSICAL EXAMINATION: General appearance: alert, cooperative, and no distress Head: Normocephalic, without obvious abnormality, atraumatic Neck: no adenopathy, no JVD, supple, symmetrical, trachea midline, and thyroid not enlarged, symmetric, no tenderness/mass/nodules Lymph nodes: Cervical, supraclavicular, and axillary nodes normal. Resp: clear to auscultation bilaterally Back: symmetric, no curvature. ROM normal. No CVA tenderness. Cardio: regular rate and rhythm, S1, S2 normal, no murmur, click, rub or gallop GI: soft, non-tender; bowel sounds normal; no masses,  no organomegaly Extremities: extremities normal, atraumatic, no cyanosis or edema  ECOG PERFORMANCE STATUS: 1 - Symptomatic but completely ambulatory  Blood pressure 139/85, pulse (!) 109, temperature 98.8 F (37.1 C), temperature source Oral, resp. rate 17, weight 123 lb 4.8 oz (55.9 kg), SpO2 100%.  LABORATORY DATA: Lab Results  Component Value Date   WBC 4.7 05/02/2023   HGB 13.6 05/02/2023   HCT 40.7 05/02/2023   MCV 91.5 05/02/2023   PLT 277 05/02/2023      Chemistry      Component Value Date/Time   NA 137 05/02/2023 1144   K 4.2 05/02/2023 1144   CL 103 05/02/2023 1144   CO2 24 05/02/2023 1144   BUN 18 05/02/2023 1144   CREATININE 0.66 05/02/2023 1144      Component Value Date/Time   CALCIUM 9.0 05/02/2023 1144   ALKPHOS 62 05/02/2023 1144   AST 26 05/02/2023 1144   ALT 21 05/02/2023 1144   BILITOT 0.9 05/02/2023 1144       RADIOGRAPHIC STUDIES: CT Chest W Contrast  Result Date:  05/06/2023 CLINICAL DATA:  67 year old female with history of multiple pulmonary nodules. Follow-up study. EXAM: CT CHEST WITH CONTRAST TECHNIQUE: Multidetector CT imaging of the chest was performed during intravenous contrast administration. RADIATION DOSE REDUCTION: This exam was performed according to the departmental dose-optimization program which includes automated exposure control, adjustment of the mA and/or kV according to patient size and/or use of iterative reconstruction technique. CONTRAST:  75mL OMNIPAQUE IOHEXOL 300 MG/ML  SOLN COMPARISON:  Chest CT 11/01/2022 . FINDINGS: Cardiovascular: Heart size is normal. There is no significant pericardial fluid, thickening or pericardial calcification. There is aortic atherosclerosis, as well as atherosclerosis of the great vessels of the mediastinum and the coronary arteries, including calcified atherosclerotic plaque in the left anterior descending coronary artery. Mediastinum/Nodes: No pathologically enlarged mediastinal or hilar lymph nodes. Esophagus is unremarkable in appearance. No axillary lymphadenopathy. Lungs/Pleura: Previously noted patchy areas of ground-glass attenuation in the right lung have resolved indicative of a benign process on the prior study. Some small pulmonary nodules are again noted measuring up to 4 mm in the right lower lobe (axial image 115 of series 7), stable compared to the  prior examination, as well as more remote prior study from 2022, considered benign (no imaging follow-up recommended). No other larger more suspicious appearing pulmonary nodules or masses are noted. No acute consolidative airspace disease. No pleural effusions. Upper Abdomen: Unremarkable. Musculoskeletal: There are no aggressive appearing lytic or blastic lesions noted in the visualized portions of the skeleton. IMPRESSION: 1. Multiple tiny pulmonary nodules measuring 4 mm or less in size, stable compared to prior studies, considered benign. 2. Aortic  atherosclerosis, in addition to left anterior descending coronary artery disease. Please note that although the presence of coronary artery calcium documents the presence of coronary artery disease, the severity of this disease and any potential stenosis cannot be assessed on this non-gated CT examination. Assessment for potential risk factor modification, dietary therapy or pharmacologic therapy may be warranted, if clinically indicated. Aortic Atherosclerosis (ICD10-I70.0). Electronically Signed   By: Trudie Reed M.D.   On: 05/06/2023 18:04    ASSESSMENT AND PLAN: This is a very pleasant 67 years old white female with few subcentimeter pulmonary nodules likely inflammatory in origin but early malignancy could not be completely excluded. The patient has been in observation. She had repeat CT scan of the chest performed recently.  I personally and independently reviewed the scan images and discussed the result and showed the images to the patient today. Her scan showed stable small pulmonary nodules.    Pulmonary Nodules Stable bilateral lung nodules, maximum diameter 4mm, with no significant change over the past six months. Radiologist interpretation suggests potential benign nature. No new respiratory symptoms, but reported increased fatigue and unintentional weight loss of 4 pounds over the past six months. -Discharge from cancer center due to lack of evidence of malignancy. -Recommend follow-up with primary care physician or referral to a pulmonologist for continued monitoring of nodules, potentially with annual scans.  Atherosclerosis Noted on imaging, common in patients over 46 years old. No current symptoms of chest pain. -Recommend follow-up with primary care physician for potential cholesterol management and monitoring for cardiac symptoms.  The patient was advised to call if she has any concerning symptoms.  The patient voices understanding of current disease status and treatment  options and is in agreement with the current care plan.  All questions were answered. The patient knows to call the clinic with any problems, questions or concerns. We can certainly see the patient much sooner if necessary.  The total time spent in the appointment was 20 minutes.  Disclaimer: This note was dictated with voice recognition software. Similar sounding words can inadvertently be transcribed and may not be corrected upon review.

## 2023-05-10 ENCOUNTER — Other Ambulatory Visit: Payer: Self-pay | Admitting: Medical Genetics

## 2023-05-10 DIAGNOSIS — Z006 Encounter for examination for normal comparison and control in clinical research program: Secondary | ICD-10-CM

## 2023-06-03 ENCOUNTER — Other Ambulatory Visit
Admission: RE | Admit: 2023-06-03 | Discharge: 2023-06-03 | Disposition: A | Discharge status: Home / Self Care | Payer: BC Managed Care – PPO | Source: Ambulatory Visit | Attending: Medical Genetics | Admitting: Medical Genetics

## 2023-06-03 DIAGNOSIS — Z006 Encounter for examination for normal comparison and control in clinical research program: Secondary | ICD-10-CM | POA: Insufficient documentation

## 2023-06-16 LAB — GENECONNECT MOLECULAR SCREEN: Genetic Analysis Overall Interpretation: NEGATIVE

## 2023-06-25 ENCOUNTER — Ambulatory Visit: Payer: Self-pay | Admitting: Surgery

## 2023-08-12 ENCOUNTER — Encounter (HOSPITAL_BASED_OUTPATIENT_CLINIC_OR_DEPARTMENT_OTHER): Payer: Self-pay | Admitting: Surgery

## 2023-08-13 ENCOUNTER — Encounter (HOSPITAL_COMMUNITY): Payer: Self-pay

## 2023-08-13 ENCOUNTER — Encounter (HOSPITAL_BASED_OUTPATIENT_CLINIC_OR_DEPARTMENT_OTHER): Payer: Self-pay | Admitting: Surgery

## 2023-08-13 NOTE — Progress Notes (Signed)
 Spoke w/ via phone for pre-op interview--- Dody Lab needs dos---- NONE        Lab results------Current EKG in Epic dated 07/26/22 COVID test -----patient states asymptomatic no test needed Arrive at -------0730 NPO after MN NO Solid Food.  Clear liquids from MN until---0630 Med rec completed Medications to take morning of surgery -----NONE Diabetic medication ----- Patient instructed no nail polish to be worn day of surgery Patient instructed to bring photo id and insurance card day of surgery Patient aware to have Driver (ride ) / caregiver    for 24 hours after surgery - Daughter Avelina Finder Patient Special Instructions -----pt verbalized understanding of bowel prep instructions Pre-Op special Instructions ----- Patient verbalized understanding of instructions that were given at this phone interview. Patient denies chest pain, sob, fever, cough at the interview.

## 2023-08-15 NOTE — Anesthesia Preprocedure Evaluation (Addendum)
 Anesthesia Evaluation  Patient identified by MRN, date of birth, ID band Patient awake    Reviewed: Allergy & Precautions, NPO status , Patient's Chart, lab work & pertinent test results  History of Anesthesia Complications (+) history of anesthetic complications (woke up during rhinoplasty)  Airway Mallampati: II  TM Distance: >3 FB Neck ROM: Full    Dental no notable dental hx. (+) Teeth Intact, Dental Advisory Given   Pulmonary neg pulmonary ROS   Pulmonary exam normal breath sounds clear to auscultation       Cardiovascular Exercise Tolerance: Good negative cardio ROS Normal cardiovascular exam Rhythm:Regular Rate:Normal     Neuro/Psych  PSYCHIATRIC DISORDERS Anxiety Depression    negative neurological ROS     GI/Hepatic Neg liver ROS,GERD  ,,  Endo/Other  negative endocrine ROS    Renal/GU negative Renal ROS     Musculoskeletal negative musculoskeletal ROS (+)    Abdominal   Peds  Hematology   Anesthesia Other Findings   Reproductive/Obstetrics negative OB ROS                             Anesthesia Physical Anesthesia Plan  ASA: 2  Anesthesia Plan: General   Post-op Pain Management: Tylenol  PO (pre-op)* and Precedex    Induction: Intravenous  PONV Risk Score and Plan: Treatment may vary due to age or medical condition, Ondansetron , Midazolam  and Dexamethasone   Airway Management Planned: Oral ETT  Additional Equipment: None  Intra-op Plan:   Post-operative Plan: Extubation in OR  Informed Consent: I have reviewed the patients History and Physical, chart, labs and discussed the procedure including the risks, benefits and alternatives for the proposed anesthesia with the patient or authorized representative who has indicated his/her understanding and acceptance.     Dental advisory given  Plan Discussed with: CRNA and Anesthesiologist  Anesthesia Plan Comments:         Anesthesia Quick Evaluation

## 2023-08-16 ENCOUNTER — Ambulatory Visit (HOSPITAL_BASED_OUTPATIENT_CLINIC_OR_DEPARTMENT_OTHER)
Admission: RE | Admit: 2023-08-16 | Discharge: 2023-08-16 | Disposition: A | Discharge status: Home / Self Care | Payer: Medicare (Managed Care) | Attending: Surgery | Admitting: Surgery

## 2023-08-16 ENCOUNTER — Encounter (HOSPITAL_BASED_OUTPATIENT_CLINIC_OR_DEPARTMENT_OTHER)
Admission: RE | Disposition: A | Discharge status: Home / Self Care | Payer: Self-pay | Source: Home / Self Care | Attending: Surgery

## 2023-08-16 ENCOUNTER — Ambulatory Visit (HOSPITAL_BASED_OUTPATIENT_CLINIC_OR_DEPARTMENT_OTHER): Payer: Medicare (Managed Care) | Admitting: Anesthesiology

## 2023-08-16 ENCOUNTER — Other Ambulatory Visit: Payer: Self-pay

## 2023-08-16 ENCOUNTER — Encounter (HOSPITAL_BASED_OUTPATIENT_CLINIC_OR_DEPARTMENT_OTHER): Payer: Self-pay | Admitting: Surgery

## 2023-08-16 DIAGNOSIS — K641 Second degree hemorrhoids: Secondary | ICD-10-CM | POA: Diagnosis not present

## 2023-08-16 DIAGNOSIS — F32A Depression, unspecified: Secondary | ICD-10-CM | POA: Insufficient documentation

## 2023-08-16 DIAGNOSIS — K642 Third degree hemorrhoids: Secondary | ICD-10-CM | POA: Diagnosis present

## 2023-08-16 DIAGNOSIS — Z5986 Financial insecurity: Secondary | ICD-10-CM | POA: Insufficient documentation

## 2023-08-16 DIAGNOSIS — F419 Anxiety disorder, unspecified: Secondary | ICD-10-CM | POA: Diagnosis not present

## 2023-08-16 DIAGNOSIS — K644 Residual hemorrhoidal skin tags: Secondary | ICD-10-CM | POA: Insufficient documentation

## 2023-08-16 HISTORY — PX: HEMORRHOID SURGERY: SHX153

## 2023-08-16 HISTORY — PX: RECTAL EXAM UNDER ANESTHESIA: SHX6399

## 2023-08-16 SURGERY — HEMORRHOIDECTOMY
Anesthesia: General | Site: Rectum

## 2023-08-16 MED ORDER — LACTATED RINGERS IV SOLN
INTRAVENOUS | Status: DC
Start: 2023-08-16 — End: 2023-08-16

## 2023-08-16 MED ORDER — PROPOFOL 10 MG/ML IV BOLUS
INTRAVENOUS | Status: DC | PRN
  Administered 2023-08-16 (×2): 40 mg via INTRAVENOUS
  Administered 2023-08-16: 120 mg via INTRAVENOUS

## 2023-08-16 MED ORDER — CHLORHEXIDINE GLUCONATE CLOTH 2 % EX PADS: 6.0000 | MEDICATED_PAD | Freq: Once | CUTANEOUS | Status: DC

## 2023-08-16 MED ORDER — OXYCODONE-ACETAMINOPHEN 5-325 MG PO TABS: 1.0000 | ORAL_TABLET | Freq: Four times a day (QID) | ORAL | 0 refills | Status: AC | PRN

## 2023-08-16 MED ORDER — OXYCODONE-ACETAMINOPHEN 5-325 MG PO TABS
1.0000 | ORAL_TABLET | Freq: Four times a day (QID) | ORAL | Status: DC | PRN
  Administered 2023-08-16: 1 via ORAL

## 2023-08-16 MED ORDER — LIDOCAINE HCL (CARDIAC) PF 100 MG/5ML IV SOSY
PREFILLED_SYRINGE | INTRAVENOUS | Status: DC | PRN
  Administered 2023-08-16: 50 mg via INTRAVENOUS

## 2023-08-16 MED ORDER — FENTANYL CITRATE (PF) 100 MCG/2ML IJ SOLN
INTRAMUSCULAR | Status: AC
  Filled 2023-08-16: qty 2

## 2023-08-16 MED ORDER — DEXAMETHASONE SODIUM PHOSPHATE 4 MG/ML IJ SOLN
INTRAMUSCULAR | Status: DC | PRN
  Administered 2023-08-16: 5 mg via INTRAVENOUS

## 2023-08-16 MED ORDER — BUPIVACAINE LIPOSOME 1.3 % IJ SUSP: 20.0000 mL | Freq: Once | INTRAMUSCULAR | Status: DC

## 2023-08-16 MED ORDER — ROCURONIUM BROMIDE 100 MG/10ML IV SOLN
INTRAVENOUS | Status: DC | PRN
  Administered 2023-08-16 (×2): 5 mg via INTRAVENOUS
  Administered 2023-08-16: 40 mg via INTRAVENOUS

## 2023-08-16 MED ORDER — MIDAZOLAM HCL 2 MG/2ML IJ SOLN
INTRAMUSCULAR | Status: AC
  Filled 2023-08-16: qty 2

## 2023-08-16 MED ORDER — ACETAMINOPHEN 325 MG PO TABS: 650.0000 mg | ORAL_TABLET | Freq: Four times a day (QID) | ORAL | Status: DC | PRN

## 2023-08-16 MED ORDER — DIAZEPAM 5 MG PO TABS: 5.0000 mg | ORAL_TABLET | Freq: Three times a day (TID) | ORAL | 1 refills | Status: AC | PRN

## 2023-08-16 MED ORDER — KETOROLAC TROMETHAMINE 15 MG/ML IJ SOLN: 15.0000 mg | Freq: Once | INTRAMUSCULAR | Status: DC | PRN

## 2023-08-16 MED ORDER — OXYCODONE-ACETAMINOPHEN 5-325 MG PO TABS
ORAL_TABLET | ORAL | Status: AC
  Filled 2023-08-16: qty 1

## 2023-08-16 MED ORDER — ACETAMINOPHEN 500 MG PO TABS
ORAL_TABLET | ORAL | Status: AC
  Filled 2023-08-16: qty 2

## 2023-08-16 MED ORDER — BUPIVACAINE-EPINEPHRINE 0.25% -1:200000 IJ SOLN
INTRAMUSCULAR | Status: DC | PRN
  Administered 2023-08-16: 8 mL

## 2023-08-16 MED ORDER — ONDANSETRON HCL 4 MG/2ML IJ SOLN: 4.0000 mg | Freq: Once | INTRAMUSCULAR | Status: DC | PRN

## 2023-08-16 MED ORDER — GABAPENTIN 300 MG PO CAPS
ORAL_CAPSULE | ORAL | Status: AC
Start: 2023-08-16 — End: ?
  Filled 2023-08-16: qty 1

## 2023-08-16 MED ORDER — ACETAMINOPHEN 500 MG PO TABS
1000.0000 mg | ORAL_TABLET | ORAL | Status: AC
  Administered 2023-08-16: 1000 mg via ORAL

## 2023-08-16 MED ORDER — SUGAMMADEX SODIUM 200 MG/2ML IV SOLN
INTRAVENOUS | Status: DC | PRN
  Administered 2023-08-16: 120 mg via INTRAVENOUS

## 2023-08-16 MED ORDER — ONDANSETRON HCL 4 MG/2ML IJ SOLN
INTRAMUSCULAR | Status: DC | PRN
  Administered 2023-08-16: 4 mg via INTRAVENOUS

## 2023-08-16 MED ORDER — FENTANYL CITRATE (PF) 100 MCG/2ML IJ SOLN
INTRAMUSCULAR | Status: DC | PRN
  Administered 2023-08-16 (×2): 25 ug via INTRAVENOUS
  Administered 2023-08-16: 50 ug via INTRAVENOUS

## 2023-08-16 MED ORDER — STERILE WATER FOR IRRIGATION IR SOLN
Status: DC | PRN
  Administered 2023-08-16: 500 mL

## 2023-08-16 MED ORDER — FENTANYL CITRATE (PF) 100 MCG/2ML IJ SOLN
25.0000 ug | INTRAMUSCULAR | Status: DC | PRN
  Administered 2023-08-16: 25 ug via INTRAVENOUS

## 2023-08-16 MED ORDER — DEXMEDETOMIDINE HCL IN NACL 80 MCG/20ML IV SOLN
INTRAVENOUS | Status: DC | PRN
  Administered 2023-08-16 (×2): 4 ug via INTRAVENOUS

## 2023-08-16 MED ORDER — GABAPENTIN 300 MG PO CAPS
300.0000 mg | ORAL_CAPSULE | ORAL | Status: AC
  Administered 2023-08-16: 300 mg via ORAL

## 2023-08-16 MED ORDER — MIDAZOLAM HCL 2 MG/2ML IJ SOLN
INTRAMUSCULAR | Status: DC | PRN
  Administered 2023-08-16: 2 mg via INTRAVENOUS

## 2023-08-16 MED ORDER — DIBUCAINE 1 % EX OINT
TOPICAL_OINTMENT | CUTANEOUS | Status: DC | PRN
  Administered 2023-08-16: 1

## 2023-08-16 SURGICAL SUPPLY — 62 items
BENZOIN TINCTURE PRP APPL 2/3 (GAUZE/BANDAGES/DRESSINGS) IMPLANT
BLADE CLIPPER SENSICLIP SURGIC (BLADE) IMPLANT
BLADE SURG 10 STRL SS (BLADE) IMPLANT
BLADE SURG 15 STRL LF DISP TIS (BLADE) ×1 IMPLANT
BRIEF MESH DISP LRG (UNDERPADS AND DIAPERS) ×1 IMPLANT
COVER BACK TABLE 60X90IN (DRAPES) ×1 IMPLANT
COVER MAYO STAND STRL (DRAPES) ×1 IMPLANT
DRAPE HYSTEROSCOPY (MISCELLANEOUS) ×1 IMPLANT
DRAPE LAPAROTOMY 100X72 PEDS (DRAPES) ×1 IMPLANT
DRAPE SHEET LG 3/4 BI-LAMINATE (DRAPES) IMPLANT
ELECT NDL TIP 2.8 STRL (NEEDLE) IMPLANT
ELECT NEEDLE TIP 2.8 STRL (NEEDLE)
ELECT REM PT RETURN 9FT ADLT (ELECTROSURGICAL) ×1
ELECTRODE REM PT RTRN 9FT ADLT (ELECTROSURGICAL) ×1 IMPLANT
FILTER STRAW (MISCELLANEOUS) ×1 IMPLANT
GAUZE 4X4 16PLY ~~LOC~~+RFID DBL (SPONGE) ×1 IMPLANT
GAUZE PAD ABD 8X10 STRL (GAUZE/BANDAGES/DRESSINGS) ×2 IMPLANT
GAUZE SPONGE 4X4 12PLY STRL (GAUZE/BANDAGES/DRESSINGS) IMPLANT
GLOVE BIOGEL PI IND STRL 8 (GLOVE) ×1 IMPLANT
GLOVE ECLIPSE 8.0 STRL XLNG CF (GLOVE) ×1 IMPLANT
GOWN STRL REUS W/TWL XL LVL3 (GOWN DISPOSABLE) ×1 IMPLANT
IV CATH 14GX2 1/4 (CATHETERS) IMPLANT
IV CATH PLACEMENT 20 GA (IV SOLUTION) ×1 IMPLANT
KIT SIGMOIDOSCOPE (SET/KITS/TRAYS/PACK) IMPLANT
KIT TURNOVER CYSTO (KITS) ×1 IMPLANT
LEGGING LITHOTOMY PAIR STRL (DRAPES) IMPLANT
LOOP VASCLR MAXI BLUE 18IN ST (MISCELLANEOUS) IMPLANT
NDL HYPO 22X1.5 SAFETY MO (MISCELLANEOUS) ×1 IMPLANT
NEEDLE HYPO 22X1.5 SAFETY MO (MISCELLANEOUS) ×1
NS IRRIG 500ML POUR BTL (IV SOLUTION) ×1 IMPLANT
PACK BASIN DAY SURGERY FS (CUSTOM PROCEDURE TRAY) ×1 IMPLANT
PAD PREP 24X48 CUFFED NSTRL (MISCELLANEOUS) ×1 IMPLANT
PENCIL SMOKE EVACUATOR (MISCELLANEOUS) ×1 IMPLANT
SCRUB TECHNI CARE 4 OZ NO DYE (MISCELLANEOUS) ×1 IMPLANT
SHEARS HARMONIC 9CM CVD (BLADE) IMPLANT
SLEEVE SCD COMPRESS KNEE MED (STOCKING) ×1 IMPLANT
SPIKE FLUID TRANSFER (MISCELLANEOUS) ×1 IMPLANT
SURGILUBE 2OZ TUBE FLIPTOP (MISCELLANEOUS) ×1 IMPLANT
SUT CHROMIC 2 0 SH (SUTURE) ×1 IMPLANT
SUT CHROMIC 3 0 SH 27 (SUTURE) ×1 IMPLANT
SUT ETHIBOND 0 (SUTURE) IMPLANT
SUT MNCRL AB 4-0 PS2 18 (SUTURE) IMPLANT
SUT PROLENE 2 0 SH DA (SUTURE) IMPLANT
SUT VIC AB 2-0 SH 27XBRD (SUTURE) IMPLANT
SUT VIC AB 2-0 UR6 27 (SUTURE) ×1 IMPLANT
SUT VIC AB 3-0 SH 18 (SUTURE) IMPLANT
SUT VICRYL 0 UR6 27IN ABS (SUTURE) IMPLANT
SUT VICRYL AB 2 0 TIE (SUTURE) IMPLANT
SWAB COLLECTION DEVICE MRSA (MISCELLANEOUS) IMPLANT
SWAB CULTURE ESWAB REG 1ML (MISCELLANEOUS) IMPLANT
SYR 20ML LL LF (SYRINGE) ×1 IMPLANT
SYR 27GX1/2 1ML LL SAFETY (SYRINGE) ×1 IMPLANT
SYR BULB IRRIG 60ML STRL (SYRINGE) ×1 IMPLANT
SYR CONTROL 10ML LL (SYRINGE) IMPLANT
TAPE CLOTH 3X10 TAN LF (GAUZE/BANDAGES/DRESSINGS) ×1 IMPLANT
TOWEL OR 17X24 6PK STRL BLUE (TOWEL DISPOSABLE) ×2 IMPLANT
TRAY DSU PREP LF (CUSTOM PROCEDURE TRAY) ×1 IMPLANT
TUBE CONNECTING 12X1/4 (SUCTIONS) ×1 IMPLANT
UNDERPAD 30X36 HEAVY ABSORB (UNDERPADS AND DIAPERS) ×1 IMPLANT
VASCULAR TIE MAXI BLUE 18IN ST (MISCELLANEOUS)
WATER STERILE IRR 500ML POUR (IV SOLUTION) ×1 IMPLANT
YANKAUER SUCT BULB TIP NO VENT (SUCTIONS) ×2 IMPLANT

## 2023-08-16 NOTE — H&P (Signed)
 08/16/2023    REFERRING PHYSICIAN: Self  Patient Care Team: Valora Lynwood FALCON, MD as PCP - General (Family Medicine) Unk Corinn Skiff, MD (Gastroenterology) Sheldon, Elspeth Bitter, MD as Consulting Provider (General Surgery)  PROVIDER: ELSPETH BITTER SHELDON, MD  [facility] MRN: [id number] DOB: Mar 15, 1956  SUBJECTIVE   Chief Complaint: Follow-up (Rectal bleeding )   Gabrielle Malone is a 68 y.o. female  who is seen today as an office consultation  at the request of PA. Self  for evaluation of prolapsing rectal tissue with bleeding.  History of Present Illness:  Patient returns. She has a story suspicious for prolapsing hemorrhoids but she was concerned about may be full prolapse. Did not want to consider surgical options. Discussed fiber bowel regimen etc. and follow-up if she did not improve.  She notes she has had about 4 episodes where her tissue is popped out. She had an episode where it felt like she could not hold her flatus. Having to wear a pad for some moisture and blood irritation. No severe incontinence with a giant mass popping out. No incontinence to urine. Denies any sharp pain with this nor purulent drainage.   PRIOR VISIT 03/18/2023 68 year old woman who had an episode of tissue popping out the anus was bleeding. Told primary care physician it was a size of a grapefruit. She landed on her side in the bed and it naturally went back in. However concerning. Patient had an underwhelming colonoscopy aside from removal of ascending and transverse colon adenomatous polyps. November 2023 without any rectal masses or major hemorrhoids. Patient notes she has had a couple smaller episodes when she was sleeping. She normally is very physically active and is afraid to do any lifting or straining with this. She denies any major accidents or fecal incontinence or incontinence to flatus. Denies any sphincter injury. She does not feel anything popping out when she is straining to defecate or feeling  like she has to push something back in. Denies any prior history of hemorrhoids or anal fissures or abscesses. No history of any dietary issues or Crohn's ulcer colitis or any colitis. No bad bouts of diarrhea.  She denies any history of constipation, moving her bowels once or twice a day. She can walk several miles any difficulty. No tobacco or diabetic issues. 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 She had a tubal ligation. Sounds like she had a removal of a ovarian or dermal cyst and oophorectomy. She had a history of endometriosis with some laparoscopic ablation and a C-section. She has not had any surgeries in the abdomen in over a decade though.  Medical History:  Past Medical History:  Diagnosis Date  Anxiety  Depression  Endometriosis  Gestational diabetes (HHS-HCC)  Menstrual problem, unspecified  Status post normal childbirth 1992  vaginal   Patient Active Problem List  Diagnosis  Physical exam, organ donor  S/P gastric bypass  Pre-op evaluation  Gestational diabetes (HHS-HCC)  Menstrual problem, unspecified  Endometriosis  Anxiety  Depression  Status post normal childbirth  Dupuytren's disease of palm of right hand  Trigger middle finger of right hand  De Quervain's tenosynovitis  Trigger ring finger of right hand  Closed nondisplaced fracture of proximal phalanx of lesser toe  AC separation, type 2, left, initial encounter  History of adenomatous polyp of colon  Prolapsed internal hemorrhoids, grade 3  External hemorrhoid, bleeding   Past Surgical History:  Procedure Laterality Date  RHINOPLASTY 1977  COLONOSCOPY 04/25/2009  Dr.  P. Oh @ [facility] - Adenomatous Polyp  COLONOSCOPY 11/10/2013  Dr. SHAUNNA. Oh @ [facility] - Hyperplastic Polyp, PHPolyp: 5 yr rpt per PYO  1. Excision of Dupuytren's contracture right hand. 2. Release right long trigger finger. Right 10/13/2020  Dr. Edie  1. Release of Dupuytren's contracture, right palm. 2. Release right ring  trigger finger. Right 03/28/2021  Dr. Edie  CESAREAN SECTION  x1  ENDOSCOPIC CARPAL TUNNEL RELEASE Bilateral  EXPLORATORY LAPAROTOMY  Endometriosis  LAPAROSCOPIC GASTRIC BYPASS  LAPAROSCOPIC TUBAL LIGATION  OOPHORECTOMY  For Dermoid  PHOTOREFRACTIVE KERATOTOMY/LASIK  RHINOPLASTY  age 54 years  TONSILLECTOMY    No Known Allergies  Current Outpatient Medications on File Prior to Visit  Medication Sig Dispense Refill  zolpidem (AMBIEN) 10 mg tablet Take 1 tablet (10 mg total) by mouth at bedtime 30 tablet 5  buPROPion (WELLBUTRIN XL) 150 MG XL tablet  cyanocobalamin (VITAMIN B12) 1,000 mcg/mL injection Inject 1 mL (1,000 mcg total) into the muscle monthly 3 mL 3  dextroamphetamine-amphetamine (ADDERALL) 10 mg tablet Take 1 tablet (10 mg total) by mouth every morning (Patient not taking: Reported on 02/19/2023)  gabapentin  (NEURONTIN ) 300 MG capsule Take 1 capsule (300 mg total) by mouth at bedtime  ibandronate (BONIVA) 150 mg tablet TAKE 1 TABLET(150 MG) BY MOUTH EVERY 30 DAYS WITH A FULL GLASS OF WATER . DO NOT LIE DOWN FOR THE NEXT 60 MINUTES 3 tablet 3  propranoloL (INDERAL) 20 MG tablet TAKE 1 TABLET(20 MG) BY MOUTH TWICE DAILY 60 tablet 11  sertraline (ZOLOFT) 100 MG tablet Take 1 tablet (100 mg total) by mouth every morning   No current facility-administered medications on file prior to visit.   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; Also in [employer] for [employer]  Tobacco Use  Smoking status: Never  Smokeless tobacco: Never  Vaping Use  Vaping status: Never Used  Substance and Sexual Activity  Alcohol use: Yes   Comment: 4 nights a week; 2 glasses  Drug use: No  Social History Narrative  Lives with Husband; Daughters are in [place],   Social Drivers of Health   Financial Resource Strain: Low Risk (05/29/2022)  Overall Financial Resource Strain (CARDIA)  Difficulty of Paying Living Expenses: Not hard at all  Food Insecurity: No Food Insecurity (05/29/2022)  Hunger Vital Sign  Worried About Running Out of Food in the Last Year: Never true  Ran Out of Food in the Last Year: Never true  Transportation Needs: No Transportation Needs (05/29/2022)  PRAPARE - Risk Analyst (Medical): No  Lack of Transportation (Non-Medical): No  Housing Stability: Low Risk (05/29/2022)  Housing Stability Vital Sign  Unable to Pay for Housing in the Last Year: No  Number of Places Lived in the Last Year: 1  Unstable Housing in the Last Year: No   ############################################################  Review of Systems: A complete review of systems (ROS) was obtained from the patient.  We have reviewed this information and discussed as appropriate with the patient.  See HPI as well for other pertinent ROS.  Constitutional: No fevers, chills, sweats. Weight stable Eyes: No vision changes, No discharge HENT: No  sore throats, nasal drainage Lymph: No neck swelling, No bruising easily Pulmonary: No cough, productive sputum CV: No orthopnea, PND . No exertional chest/neck/shoulder/arm pain. Patient can walk 2-3 miles without difficulty.   GI: No personal nor family history of GI/colon cancer, inflammatory bowel disease, irritable bowel syndrome, allergy such as Celiac Sprue, dietary/dairy problems, colitis, ulcers nor gastritis. No recent sick contacts/gastroenteritis. No travel outside the country. No changes in diet.  Renal: No UTIs, No hematuria Genital: No drainage, bleeding, masses Musculoskeletal: No severe joint pain. Good ROM major joints Skin: No sores or  lesions Heme/Lymph: No easy bleeding. No swollen lymph nodes Neuro: No active seizures. No facial droop Psych: No hallucinations. No agitation  OBJECTIVE   Vitals:  06/25/23 1052  Temp: 36.7 C (98 F)  Weight: 56.1 kg (123 lb 9.6 oz)    Body mass index is 23.35 kg/m.  PHYSICAL EXAM:  Constitutional: Not cachectic. Hygeine adequate. Vitals signs as above.  Eyes: No glasses. Vision adequate,Pupils reactive, normal extraocular movements. Sclera nonicteric Neuro: CN II-XII intact. No major focal sensory defects. No major motor deficits. Lymph: No head/neck/groin lymphadenopathy Psych: No severe agitation. No severe anxiety. Judgment & insight Adequate, Oriented x4, HENT: Normocephalic, Mucus membranes moist. No thrush. Hearing: adequate Neck: Supple, No tracheal deviation. No obvious thyromegaly Chest: No pain to chest wall compression. Good respiratory excursion. No audible wheezing CV: Pulses intact. regular. No major extremity edema Ext: No obvious deformity or contracture. Edema: Not present. No cyanosis Skin: No major subcutaneous nodules. Warm and dry Musculoskeletal: Severe joint rigidity not present. No obvious clubbing. No digital petechiae. Mobility: no assist device moving easily without restrictions Abdomen: Flat Soft. Nondistended. Nontender. Hernia: Not present.  Genital/Pelvic: Inguinal hernia: Not present. Inguinal lymph nodes: without lymphadenopathy nor hidradenitis.   Rectal: Perianal skin Clean with good hygiene no evidence of any soiling or leaking. Pruritis ani: Mild Pilonidal disease: Not present Condyloma / warts: Not present  Anal fissure: Not present Perirectal abscess/fistula Not present External hemorrhoids Not present  Digital and anoscopic rectal exam tolerated  Sphincter tone Normal   Hemorrhoids: Examination done after patient sitting on toilet straining for over 7 minutes. No major prolapsing tissue after that. With witnessed  straining/defecation, the right anterior hemorrhoid with external tag easily prolapses out. However it has less than the third of the circumference consistent with hemorrhoid only. No true procidentia/circumferential prolapse. Left lateral and right posterior at least grade 2 with no major prolapsing occurring.   Prostate: N/A Rectovaginal septum: Intact with normal thickness & no rectocele Rectal masses: Not present  Patient examined with assistance of female Medical Assistant in the room with patient in decubitus position .  ###################################    ###################################################################  Labs, Imaging and Diagnostic Testing:  Located in 'Care Everywhere' section of Epic EMR chart  PRIOR [facility] CLINIC NOTES:  Not applicable  SURGERY NOTES:  Not applicable  PATHOLOGY:  Not applicable  Assessment and Plan:  DIAGNOSES:  Diagnoses and all orders for this visit:  Prolapsed internal hemorrhoids, grade 3  History of adenomatous polyp of colon  External hemorrhoid, bleeding    ASSESSMENT/PLAN  Persistent episodes of prolapse consistent with right anterior grade 3 hemorrhoid with some external tags. Not true circumferential prolapse by defecation and straining today. Just a large prolapsing hemorrhoid..  I again recommended anorectal examination anesthesia with hemorrhoidal ligation & pexy and probable hemorrhoidectomy. Outpatient surgery.  The anatomy & physiology of the anorectal region was discussed. The pathophysiology of hemorrhoids and differential diagnosis was discussed.  Natural history risks without surgery was discussed. I stressed the importance of a bowel regimen to have daily soft bowel movements to minimize progression of disease. Interventions such as sclerotherapy & banding were discussed.  The patient's symptoms are not adequately controlled by medicines and other non-operative treatments. I feel the risks & problems  of no surgery outweigh the operative risks; therefore, I recommended surgery to treat the hemorrhoids by ligation, pexy, and possible resection.  Risks such as bleeding, infection, urinary difficulties, injury to other organs, need for repair of tissues / organs, need for further treatment, heart attack, death, and other risks were discussed. I noted a good likelihood this will help address the problem. Goals of post-operative recovery were discussed as well. Possibility that this will not correct all symptoms was explained. Post-operative pain, bleeding, constipation, and other problems after surgery were discussed. We will work to minimize complications. Educational handouts further explaining the pathology, treatment options, and bowel regimen were given as well. Questions were answered. The patient expresses understanding & wishes to proceed with surgery. I think she was trying not to be a bother him be able to stay at home and get back to regular activities within a week. I cautioned she would need a couple weeks before thinking about driving and being more independent. Very helpful to have people to help drive and keep an eye on her. Strongly recommend she do the prescription pain meds for the first week or so to get through the rough or phases. Good pain control = good recovery. She will take it under advisement.   Elspeth KYM Schultze, MD, FACS, MASCRS Esophageal, Gastrointestinal & Colorectal Surgery Robotic and Minimally Invasive Surgery  [facility] [address] [place], [place] [address] [phone] Fax [phone] Main  CONTACT INFORMATION: Weekday (9AM-5PM): Call [facility] main office at [phone] Weeknight (5PM-9AM) or Weekend/Holiday: Check EPIC Web Links tab & use AMION (password  [email or web address]) for General Surgery [facility] coverage  Please, DO NOT use SecureChat  (it is not reliable communication to reach operating surgeons &  will lead to a delay in care).   Epic staff messaging available for outptient concerns needing 1-2 business day response.     08/16/2023

## 2023-08-16 NOTE — Discharge Instructions (Addendum)
 Do NOT Take Tylenol  until after 2:30pm    ##############################################  ANORECTAL SURGERY:  POST OPERATIVE INSTRUCTIONS  ######################################################################  EAT Start with a pureed / full liquid diet After 24 hours, gradually transition to a high fiber diet.    CONTROL PAIN Control pain so you can tolerate bowel movements,  walk, sleep, tolerate sneezing/coughing, and go up/down stairs.   HAVE A BOWEL MOVEMENT DAILY Keep your bowels regular to avoid problems.   Taking a fiber supplement 2-4 doses every day to keep bowels soft.   HAVE A BM BY THE SECOND POSTOPERATIVE DAY Use an antidairrheal to slow down diarrhea.   Call if not better after 2 tries  WALK Walk an hour a day.  Control your pain to do that.   CALL IF YOU HAVE PROBLEMS/CONCERNS Call if you are still struggling despite following these instructions. Call if you have concerns not answered by these instructions  ######################################################################    Take your usually prescribed home medications unless otherwise directed. Blood thinners:  You can restart any strong blood thinners after the second postoperative day  for example: COUMADIN (warfarin), XERELTO (rivaroxaban), ELIQUIS (apixaban), PLAVIX (clopidigrel), BRILINTA (ticagrelor), EFFIENT (prasugrel), PRADAXA (dabigatran), etc  Continue aspirin before & after surgery..     Some oozing/bleeding the first 1-2 weeks is common but should taper down & be small volume.    If you are passing many large clots or having uncontrolling bleeding, call your surgeon  DIET: Follow a light bland diet & liquids the first 24 hours after arrival home, such as soup, liquids, starches, etc.  Be sure to drink plenty of fluids.  Quickly advance to a usual solid diet within a few days.  Avoid fast food or heavy meals as your are more likely to get nauseated or have irregular bowels.  A  low-fat, high-fiber diet for the rest of your life is ideal.  Control pain to avoid problems with urinating or defecating:  Pain is best controlled by a usual combination of many methods TOGETHER: Warm baths/soaks or Ice packs Over the counter pain medication Prescription pain medications Topical creams  A.  Warm water  baths or ice packs (30-60 minutes up to 8 times a day, especially after bowel meovements) will help.  Using ice for the first few days can help decrease swelling and bruising,  Use heat such as warm towels, sitz baths, warm baths, warm showers, etc to help relax tight/sore spots and speed recovery.   Some people prefer to use ice alone, heat alone, alternating between ice & heat.  Experiment to what works for you.    It is helpful to take an over-the-counter pain medication continuously for the first few weeks.  This helps people need to use less narcotics Choose one of the following that works best for you: Naproxen (Aleve, etc)  Two 220mg  tabs twice a day Ibuprofen  (Advil , etc) Three 200mg  tabs four times a day (every meal & bedtime) Acetaminophen  (Tylenol , etc) 500-650mg  four times a day (every meal & bedtime)  A  prescription for pain medication (such as oxycodone , hydrocodone , etc) should be given to you upon discharge.  Take your pain medication as prescribed.  If you are having problems/concerns with the prescription medicine (does not control pain, nausea, vomiting, rash, itching, etc), please call us  [phone] to see if we need to switch you to a different pain medicine that will work better for you and/or control your side effect better. Most people will need 1-2 refills to get through  the first couple of weeks when pain is the most intense.  If you need a refill on your pain medication, please contact your pharmacy.  They will contact our office to request authorization. Prescriptions will not be filled after 5 pm or on week-ends.  If can take up to 48 hours for  it to be filled & ready so avoid waiting until you are down to thel ast pill.  A numbing topical cream (Dibucaine) may be given to you.  Many people find relief with topical creams.  Some people find it burns too much.  Experiment.  If it helps, use it.  If it burns, stop using it.  You also may receive a prescription for diazepam , a muscle relaxant to help you to be able to urinate at first easily.  It is safe to take a few doses with the other medications as long as you are not planning to drive or do anything intense.  Hopefully this can minimize the chance of needing a Foley catheter into your bladder     Have a bowel movement every day  Until you have a bowel movement after surgery, he will struggle with urinating and controlling her pain.  Take a fiber supplement (such as Metamucil, Citrucel, FiberCon, MiraLax, etc) 2-4 doses a day to help prevent constipation from occurring.     If you have not had a bowel movement the second postoperative day:  -switch to drinking liquids only -take MiraLAX double dose every 2 hours x 3 or until you have a BM -If that does not work x 3 doses, increase to 4 doses every 2 hours (Like a small bowel prep) until you have a bowel movement. -Avoid enemas or suppositories (can harm sutures/repair) -Once you start having bowel movements, adjust your fiber supplement or Miralax dosing back down until you are having 1-2 soft well formed BMs a day.  (Start at 2 doses a day & adjust up or down to avoid diarrhea or recurrent constipation)   Watch out for diarrhea.  If you have many loose bowel movements, simplify your diet to bland foods & liquids for a few days.  Stop any stool softeners and decrease your fiber supplement.  Switching to mild anti-diarrheal medications (Kayopectate, Pepto Bismol) can help.  Can try an imodium/loperamide dose.  If this worsens or does not improve, please call us .  Wound Care   a. You have some fluffed cotton on top of the anus  to help catch drainage and bleeding.  THERE IS NO PACKING INSIDE THE RECTUM -Let the cotton fall off with the first bowel movement or shower.  It is okay to reinforce or replace as needed.  Bleeding is common at first and gradually slows down after a few weeks   b. Place soft cotton balls on the anus/wounds and use an absorbent pad in your underwear as needed to catch any drainage and help keep the area.  Try to use cotton balls or pads (regular gauze or toilet paper will stick and pull, causing pain.  Cotton will come off more easily).  OK to try dry powders such as cornstarch or baby powders   c. Keep the area clean and dry.  Bathe / shower every day.  Keep the area clean by showering / bathing over the incision / wound.   It is okay to soak an open wound to help wash it.  Consider using a squeeze bottle filled with warm water  to gently wash the anal area.  Wet wipes or showers / gentle washing after bowel movements is often less traumatic than regular toilet paper.           D.  Use a Sitz Bath 4-8 times a day for relief  A sitz bath is a warm water  bath taken in the sitting position that covers only the hips and buttocks. It may be used for either healing or hygiene purposes. Sitz baths are also used to relieve pain, itching, or muscle spasms.  Gently cleaned the area and the heat will help lower spasm and offer better pain control.    Fill the bathtub half full with warm water . Sit in the water  and open the drain a little. Turn on the warm water  to keep the tub half full. Keep the water  running constantly. Soak in the water  for 15 to 20 minutes. After the sitz bath, pat the affected area dry first.   d. You will often notice bleeding, especially with bowel movements.  This should slow down by the end of the first week of surgery.  It can take over a month to more fully stop.  Sitting on an ice pack can help.   e. Expect some drainage.  You often will have some blood or yellow drainage with open  wounds.  Sometimes she will get a little leaking of liquid stool until the incision/wounds have fully close down.  This should slow down by the end of the first week of surgery, but you will have occasional bleeding or drainage up to a few months after surgery until all incisions & wounds have closed.  Wear an absorbent pad or soft cotton gauze in your underwear until the drainage stops.  ACTIVITIES as tolerated:    You may resume regular (light) daily activities beginning the next day--such as daily self-care, walking, climbing stairs--gradually increasing activities as tolerated.  If you can walk 30 minutes without difficulty, it is safe to try more intense activity such as jogging, treadmill, bicycling, low-impact aerobics, swimming, etc. Save the most intensive and strenuous activity for last such as sit-ups, heavy lifting, contact sports, etc  Refrain from any heavy lifting or straining until you are off narcotics for pain control.   DO NOT PUSH THROUGH PAIN.  Let pain be your guide: If it hurts to do something, don't do it.  Pain is your body warning you to avoid that activity for another week until the pain goes down. You may drive when you are no longer taking prescription pain medication, you can comfortably sit for long periods of time, and you can safely maneuver your car and apply brakes. You may have sexual intercourse when it is comfortable.   6.  FOLLOW UP in our office Please call [facility] at [phone] to set up an appointment to see your surgeon in the office for a follow-up appointment approximately 3 weeks after your surgery. If tissue is sent for pathology, it usually takes a week for pathology results to come back.  We will make an effort to call you with results as soon as we get them.  If you have not heard back in a week and wish to know the results before your postop visit, please call our office and we will see if we can give feedback on the results Make sure that you call for  this appointment the day you arrive home to ensure a convenient appointment time.  7. IF YOU HAVE DISABILITY OR FAMILY LEAVE FORMS, BRING THEM TO THE  OFFICE FOR PROCESSING.  DO NOT GIVE THEM TO YOUR DOCTOR.        WHEN TO CALL US  [phone]: Poor pain control Reactions / problems with new medications (rash/itching, nausea, etc)  Fever over 101.5 F (38.5 C) Inability to urinate Nausea and/or vomiting Worsening swelling or bruising Continued bleeding from incision. Increased pain, redness, or drainage from the incision  The clinic staff is available to answer your questions during regular business hours (8:30am-5pm).  Please don't hesitate to call and ask to speak to one of our nurses for clinical concerns.   A surgeon from [facility] is always on call at the hospitals   If you have a medical emergency, go to the nearest emergency room or call 911.    [facility] [address], [address], [place], [place]  [address] ? MAIN: [phone] ? TOLL FREE: [phone] ? FAX [phone] [email or web address]  #####################################################  Post Anesthesia Home Care Instructions  Activity: Get plenty of rest for the remainder of the day. A responsible adult should stay with you for 24 hours following the procedure.  For the next 24 hours, DO NOT: -Drive a car -Advertising copywriter -Drink alcoholic beverages -Take any medication unless instructed by your physician -Make any legal decisions or sign important papers.  Meals: Start with liquid foods such as gelatin or soup. Progress to regular foods as tolerated. Avoid greasy, spicy, heavy foods. If nausea and/or vomiting occur, drink only clear liquids until the nausea and/or vomiting subsides. Call your physician if vomiting continues.  Special Instructions/Symptoms: Your throat may feel dry or sore from the anesthesia or the breathing tube placed in your  throat during surgery. If this causes discomfort, gargle with warm salt water . The discomfort should disappear within 24 hours.   Information for Discharge Teaching: EXPAREL  (bupivacaine  liposome injectable suspension)   Your surgeon gave you EXPAREL (bupivacaine ) in your surgical incision to help control your pain after surgery.  EXPAREL  is a local anesthetic that provides pain relief by numbing the tissue around the surgical site. EXPAREL  is designed to release pain medication over time and can control pain for up to 72 hours. Depending on how you respond to EXPAREL , you may require less pain medication during your recovery.  Possible side effects: Temporary loss of sensation or ability to move in the area where bupivacaine  was injected. Nausea, vomiting, constipation Rarely, numbness and tingling in your mouth or lips, lightheadedness, or anxiety may occur. Call your doctor right away if you think you may be experiencing any of these sensations, or if you have other questions regarding possible side effects.  Follow all other discharge instructions given to you by your surgeon or nurse. Eat a healthy diet and drink plenty of water  or other fluids.  If you return to the hospital for any reason within 96 hours following the administration of EXPAREL , please inform your health care providers.

## 2023-08-16 NOTE — Anesthesia Procedure Notes (Signed)
 Procedure Name: Intubation Date/Time: 08/16/2023 9:48 AM  Performed by: Donnell Berwyn SQUIBB, CRNAPre-anesthesia Checklist: Patient identified, Emergency Drugs available, Suction available, Patient being monitored and Timeout performed Patient Re-evaluated:Patient Re-evaluated prior to induction Oxygen Delivery Method: Circle system utilized Preoxygenation: Pre-oxygenation with 100% oxygen Induction Type: IV induction Ventilation: Mask ventilation without difficulty Laryngoscope Size: Mac and 3 Grade View: Grade I Tube type: Oral Tube size: 7.0 mm Number of attempts: 1 Airway Equipment and Method: Stylet Placement Confirmation: positive ETCO2, ETT inserted through vocal cords under direct vision and breath sounds checked- equal and bilateral Secured at: 20 cm Tube secured with: Tape Dental Injury: Teeth and Oropharynx as per pre-operative assessment

## 2023-08-16 NOTE — Transfer of Care (Addendum)
 Immediate Anesthesia Transfer of Care Note  Patient: Gabrielle Malone  Procedure(s) Performed: HEMORRHOIDECTOMY WITH LIGATION & HEMORRHOIDOPEXY (Rectum) ANORECTAL EXAM UNDER ANESTHESIA (Rectum)  Patient Location: PACU  Anesthesia Type:General  Level of Consciousness: awake and patient cooperative  Airway & Oxygen Therapy: Patient Spontanous Breathing and Patient connected to nasal cannula oxygen  Post-op Assessment: Report given to RN and Post -op Vital signs reviewed and stable  Post vital signs: Reviewed and stable  Last Vitals:  Vitals Value Taken Time  BP    Temp    Pulse    Resp    SpO2      Last Pain:  Vitals:   08/16/23 0823  PainSc: 0-No pain      Patients Stated Pain Goal: 4 (08/16/23 0823)  Complications: No notable events documented.

## 2023-08-16 NOTE — Anesthesia Postprocedure Evaluation (Signed)
 Anesthesia Post Note  Patient: Gabrielle Malone  Procedure(s) Performed: HEMORRHOIDECTOMY WITH LIGATION & HEMORRHOIDOPEXY (Rectum) ANORECTAL EXAM UNDER ANESTHESIA (Rectum)     Patient location during evaluation: PACU Anesthesia Type: General Level of consciousness: awake and alert Pain management: pain level controlled Vital Signs Assessment: post-procedure vital signs reviewed and stable Respiratory status: spontaneous breathing, nonlabored ventilation, respiratory function stable and patient connected to nasal cannula oxygen Cardiovascular status: blood pressure returned to baseline and stable Postop Assessment: no apparent nausea or vomiting Anesthetic complications: no   No notable events documented.  Last Vitals:  Vitals:   08/16/23 0823 08/16/23 1100  BP: (!) 150/92 (!) 141/95  Pulse: 79   Resp: 17 16  Temp: 36.7 C (!) 36.3 C  SpO2: 100% 100%    Last Pain:  Vitals:   08/16/23 1100  PainSc: 5                  [name] Gab

## 2023-08-16 NOTE — Interval H&P Note (Signed)
 History and Physical Interval Note:  08/16/2023 9:05 AM  Gabrielle Malone  has presented today for surgery, with the diagnosis of Hemorrhoidectomy with ligation and hemorrhoidopexy. Anorectal examination under anesthesia.  The various methods of treatment have been discussed with the patient and family. After consideration of risks, benefits and other options for treatment, the patient has consented to  Procedure(s) with comments: HEMORRHOIDECTOMY WITH LIGATION & HEMORRHOIDOPEXY (N/A) - GENERAL & LOCAL ANESTHESIA ANORECTAL EXAM UNDER ANESTHESIA (N/A) - GENERAL & LOCAL ANESTHESIA as a surgical intervention.  The patient's history has been reviewed, patient examined, no change in status, stable for surgery.  I have reviewed the patient's chart and labs.  Questions were answered to the patient's satisfaction.    I have re-reviewed the the patient's records, history, medications, and allergies.  I have re-examined the patient.  I again discussed intraoperative plans and goals of post-operative recovery.  The patient agrees to proceed.  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   Family history of colon cancer 05/25/2022   Polyp of transverse colon 05/25/2022   Polyp of ascending colon 05/25/2022   Encounter for screening colonoscopy 05/25/2022   Anxiety 04/23/2017   Depression 04/23/2017   Gestational diabetes 04/23/2017   Endometriosis 04/23/2017   Menstrual problem 04/23/2017   Status post normal childbirth 04/23/2017   Physical exam, organ donor 02/05/2013   Pre-op evaluation 02/05/2013   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    Past Medical History:  Diagnosis Date   Alcohol abuse    Anxiety     Complication of anesthesia    Remembers part of surgery from rhinoplasty and woke up during another surgery   Depression    Endometriosis    GERD (gastroesophageal reflux disease)    Insomnia     Past Surgical History:  Procedure Laterality Date   carpal tunnel     CATARACT EXTRACTION W/PHACO Right 07/16/2022   Procedure: CATARACT EXTRACTION PHACO AND INTRAOCULAR LENS PLACEMENT (IOC) RIGHT  EYHANCE TORIC LENS  2.47  00:35.1;  Surgeon: Myrna Adine Anes, MD;  Location: [facility];  Service: Ophthalmology;  Laterality: Right;   CATARACT EXTRACTION W/PHACO Left 07/30/2022   Procedure: CATARACT EXTRACTION PHACO AND INTRAOCULAR LENS PLACEMENT (IOC) LEFT  EYHANCE TORIC LENS  4.35  00:37.1;  Surgeon: Myrna Adine Anes, MD;  Location: [facility];  Service: Ophthalmology;  Laterality: Left;   CESAREAN SECTION     COLONOSCOPY WITH PROPOFOL  N/A 05/25/2022   Procedure: COLONOSCOPY WITH PROPOFOL ;  Surgeon: Unk Corinn Skiff, MD;  Location: [facility];  Service: Gastroenterology;  Laterality: N/A;   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   Procedure: DUPUYTREN CONTRACTURE RELEASE RIGHT HAND AND RELEASE OF RIGHT RING TRIGGER FINGER;  Surgeon: Edie Norleen PARAS, MD;  Location: [facility];  Service: Orthopedics;  Laterality: Right;   GASTRIC BYPASS     OOPHORECTOMY     RHINOPLASTY     TRIGGER FINGER RELEASE Right 10/13/2020   Procedure: RIGHT MIDDLE FINGER 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;  Surgeon: Edie Norleen PARAS, MD;  Location: [facility];  Service: Orthopedics;  Laterality: Right;    Social History   Socioeconomic History   Marital status: Legally Separated    Spouse name: Not on file   Number  of children: Not on file   Years of education: Not on file   Highest education level: Not on file  Occupational History   Not on file  Tobacco Use   Smoking status: Never   Smokeless tobacco: Never  Vaping Use   Vaping status: Never Used  Substance  and Sexual Activity   Alcohol use: Yes    Comment: nightly wine   Drug use: No   Sexual activity: Not on file  Other Topics Concern   Not on file  Social History Narrative   Not on file   Social Drivers of Health   Financial Resource Strain: Low Risk  (07/31/2023)   Received from [facility]   Overall Financial Resource Strain (CARDIA)    Difficulty of Paying Living Expenses: Not hard at all  Recent Concern: Financial Resource Strain - Medium Risk (06/26/2023)   Received from [facility]   Overall Financial Resource Strain (CARDIA)    Difficulty of Paying Living Expenses: Somewhat hard  Food Insecurity: No Food Insecurity (07/31/2023)   Received from [facility]   Hunger Vital Sign    Worried About Running Out of Food in the Last Year: Never true    Ran Out of Food in the Last Year: Never true  Transportation Needs: No Transportation Needs (07/31/2023)   Received from [facility]    In the past 12 months, has lack of transportation kept you from medical appointments or from getting medications?: No    Lack of Transportation (Non-Medical): No  Physical Activity: Not on file  Stress: Not on file  Social Connections: Not on file  Intimate Partner Violence: Not on file    Family History  Problem Relation Age of Onset   Dementia Mother    Cancer Father        lung,,melanoma   Dementia Maternal Grandmother    Breast cancer Maternal Grandmother     Medications Prior to Admission  Medication Sig Dispense Refill Last Dose/Taking   cyanocobalamin (,VITAMIN B-12,) 1000 MCG/ML injection Inject 1,000 mcg into the muscle every 30 (thirty) days.   Past Month   Vitamin D, Ergocalciferol, (DRISDOL) 1.25 MG (50000 UNIT) CAPS capsule Take 50,000 Units by mouth every 7 (seven) days.   Past Week   zolpidem (AMBIEN) 10 MG tablet Take 10 mg by mouth at bedtime.  0 08/14/2023   alendronate  (FOSAMAX) 70 MG tablet Take 70 mg by mouth once a week. Take with a full glass of water  on an empty stomach.   More than a month   amphetamine-dextroamphetamine (ADDERALL) 10 MG tablet Take 10 mg by mouth daily with breakfast. Can take up to twice daily      propranolol (INDERAL) 20 MG tablet Take 20 mg by mouth every morning. (Patient not taking: Reported on 08/16/2023)   Not Taking   sertraline (ZOLOFT) 100 MG tablet Take 100 mg by mouth every morning.       Current Facility-Administered Medications  Medication Dose Route Frequency Provider Last Rate Last Admin   acetaminophen  (TYLENOL ) tablet 650 mg  650 mg Oral Q6H PRN Jefm Garnette LABOR, MD       bupivacaine  liposome (EXPAREL ) 1.3 % injection 266 mg  20 mL Infiltration Once Sheldon Standing, MD       Chlorhexidine  Gluconate Cloth 2 % PADS 6 each  6 each Topical Once Sheldon Standing, MD       And  Chlorhexidine  Gluconate Cloth 2 % PADS 6 each  6 each Topical Once Sheldon Standing, MD       lactated ringers  infusion   Intravenous Continuous Niels Marien CROME, MD 10 mL/hr at 08/16/23 0848 New Bag at 08/16/23 0848     No Known Allergies  BP (!) 150/92   Pulse 79   Temp 98 F (36.7 C)   Resp 17   Ht 5' 1.5 (1.562 m)   Wt 55.8 kg   SpO2 100%   BMI 22.88 kg/m   Labs: No results found for this or any previous visit (from the past 48 hours).  Imaging / Studies: No results found.   Briant KYM Sheldon, M.D., F.A.C.S. Gastrointestinal and Minimally Invasive Surgery [facility] [address], [address] [place], [place] [address] [phone] Main / Paging  08/16/2023 9:05 AM    Standing JAYSON Sheldon

## 2023-08-16 NOTE — Op Note (Addendum)
 08/16/2023  10:53 AM  PATIENT:  Gabrielle Malone  68 y.o. female  Patient Care Team: Valora Agent, MD as PCP - General (Family Medicine) Unk Corinn Skiff, MD as Consulting Physician (Gastroenterology) Sheldon Standing, MD as Consulting Physician (General Surgery)  PRE-OPERATIVE DIAGNOSIS:  HEMORRHOID - EXTERNAL and HEMORRHOID - INTERNAL GRADE 3 PROLAPSING  POST-OPERATIVE DIAGNOSIS:   HEMORRHOID - EXTERNAL HEMORRHOID - INTERNAL GRADE 3 PROLAPSING HEMORRHOID - INTERNAL GRADE 2 PROLAPSING  PROCEDURE:  HEMORRHOIDECTOMY x 3 HEMORRHOIDAL LIGATION & HEMORRHOIDOPEXY ANORECTAL EXAMINATION UNDER ANESTHESIA  SURGEON:  Standing KYM Sheldon, MD  ASSISTANT:  (n/a)   ANESTHESIA:   General endotracheal intubation anesthesia (GETA) and Anorectal and field block for perioperative & postoperative pain control provided with liposomal bupivacaine  (Experel) 20mL mixed with 30mL of bupivicaine 0.25% with epinephrine   Estimated Blood Loss (EBL):   No intake/output data recorded..   (See anesthesia record)  Delay start of Pharmacological VTE agent (>24hrs) due to concerns of significant anemia, surgical blood loss, or risk of bleeding?:  no  DRAINS: (None)  SPECIMEN:  Hemorrhoid: Right anterior, Right posterior, and Left lateral  DISPOSITION OF SPECIMEN:  Pathology  COUNTS:  Sponge, needle, & instrument counts CORRECT  PLAN OF CARE: Discharge to home after PACU  PATIENT DISPOSITION:  PACU - hemodynamically stable.  INDICATION: Pleasant patient with struggles with hemorrhoids.  Not able to be managed in the office despite an improved bowel regimen.  I recommended examination under anesthesia and surgical treatment:  The anatomy & physiology of the anorectal region was discussed.  The pathophysiology of hemorrhoids and differential diagnosis was discussed.  Natural history risks without surgery was discussed.   I stressed the importance of a bowel regimen to have daily soft bowel movements to minimize  progression of disease.  Interventions such as sclerotherapy & banding were discussed.  The patient's symptoms are not adequately controlled by medicines and other non-operative treatments.  I feel the risks & problems of no surgery outweigh the operative risks; therefore, I recommended surgery to treat the hemorrhoids by ligation, pexy, and possible resection.  Risks such as bleeding, infection, need for further treatment, heart attack, death, and other risks were discussed.   I noted a good likelihood this will help address the problem.  Goals of post-operative recovery were discussed as well.  Possibility that this will not correct all symptoms was explained.  Post-operative pain, bleeding, constipation, urinary difficulties, and other problems after surgery were discussed.  We will work to minimize complications.   Educational handouts further explaining the pathology, treatment options, and bowel regimen were given as well.  Questions were answered.  The patient expresses understanding & wishes to proceed with surgery.  OR FINDINGS: Very large right anterior grade 3 hemorrhoid with significant prolapse and chronic external tag.  Some redundant rectal mucosa but no circumferential procidentia/complete rectal prolapse.  Right posterior irritated grade 2 hemorrhoid with chronic external tag/nodule.  Excised.  Left lateral grade 3 with small external tag excised as well.  No fissure or fistula.  No complete rectal prolapse.  No tumor or wart.  DESCRIPTION:   Informed consent was confirmed. Patient underwent general anesthesia without difficulty. Patient was placed into  prone/jacknife positioning.  The perianal region was prepped and draped in sterile fashion. Surgical time-out confirmed our plan.  I did digital rectal examination and then transitioned over to anoscopy to get a sense of the anatomy.  Findings noted above.   I proceeded to do hemorrhoidal ligation and pexy using a large  self-retaining Parks retractor & occasionally alternating with a large International aid/development worker.  I used a 2-0 Vicryl suture on a UR-6 needle in a figure-of-eight fashion 6 cm proximal to the anal verge.  I started at the largest hemorrhoid pile, right anterior.  Because of redundant hemorrhoidal tissue too bulky to merely ligate or pexy, I excised the excess internal hemorrhoid piles longitudinally in a fusiform biconcave fashion, removing elevated hemorrhoidal tissue to a more flat region, sparing the anal canal to avoid narrowing.  I then ran that stitch longitudinally more distally to close the hemorrhoidectomy wound to the anal verge over a large Parks self-retaining anal retractor to avoid narrowing of the anal canal.    I then tied that stitch down to cause a hemorrhoidopexy.   This pile was quite inflamed/friable.  Extra suturing done for better hemostasis.  I also had to do a more narrow but similar longituditnal excisions at the  right anterior and left lateral pile locations.  I then did hemorrhoidal ligation and pexy at the other 3 hemorrhoidal columns.  At the completion of this, all 6 anorectal columns were ligated and pexied in the classic hexagonal fashion (right anterior/lateral/posterior, left anterior/lateral/posterior).    I redid anoscopy and examination.  Hemostasis was good.  I radially trimmed and closed closed the external part of the hemorrhoidectomy wounds with radial interrupted horizontal mattress 2-0 chromic suture over a large Sawyer anal retractor, leaving the last 5 mm open to allow natural drainage.  I repeated anoscopy & examination.  At completion of this, all hemorrhoids had been trimmed down and/or reduced into the rectum.  There is no more prolapse.  Internal & external anatomy was more more normal.  Hemostasis was good.  Fluffed gauze was on-laid over the perianal region.  No packing done.  Patient is being extubated go to go to the recovery room.  I had discussed postop care  in detail with the patient in the preop holding area.  Instructions for post-operative recovery and prescriptions are written. I discussed operative findings, updated the patient's status, discussed probable steps to recovery, and gave postoperative recommendations to the patient's daughter, Trinia Georgi.  Recommendations were made.  Questions were answered.  She expressed understanding & appreciation.  Elspeth KYM Schultze, M.D., F.A.C.S. Gastrointestinal and Minimally Invasive Surgery [facility] [address], [address] [place], [place] [address] [phone] Main / Paging

## 2023-08-17 ENCOUNTER — Encounter (HOSPITAL_BASED_OUTPATIENT_CLINIC_OR_DEPARTMENT_OTHER): Payer: Self-pay | Admitting: Surgery

## 2023-08-19 LAB — SURGICAL PATHOLOGY

## 2024-07-27 ENCOUNTER — Other Ambulatory Visit: Payer: Self-pay | Admitting: Specialist

## 2024-07-27 DIAGNOSIS — R0789 Other chest pain: Secondary | ICD-10-CM

## 2024-07-27 DIAGNOSIS — R0602 Shortness of breath: Secondary | ICD-10-CM

## 2024-07-29 ENCOUNTER — Ambulatory Visit: Payer: Medicare (Managed Care)

## 2024-08-06 ENCOUNTER — Ambulatory Visit: Payer: Medicare (Managed Care)

## 2024-08-07 ENCOUNTER — Other Ambulatory Visit

## 2024-08-12 ENCOUNTER — Ambulatory Visit: Payer: Medicare (Managed Care)

## 2024-08-12 DIAGNOSIS — L578 Other skin changes due to chronic exposure to nonionizing radiation: Secondary | ICD-10-CM | POA: Diagnosis not present

## 2024-08-12 DIAGNOSIS — D489 Neoplasm of uncertain behavior, unspecified: Secondary | ICD-10-CM | POA: Diagnosis not present

## 2024-08-12 DIAGNOSIS — Z1283 Encounter for screening for malignant neoplasm of skin: Secondary | ICD-10-CM

## 2024-08-12 DIAGNOSIS — D1801 Hemangioma of skin and subcutaneous tissue: Secondary | ICD-10-CM

## 2024-08-12 DIAGNOSIS — Z808 Family history of malignant neoplasm of other organs or systems: Secondary | ICD-10-CM | POA: Diagnosis not present

## 2024-08-12 DIAGNOSIS — D225 Melanocytic nevi of trunk: Secondary | ICD-10-CM

## 2024-08-12 DIAGNOSIS — W908XXA Exposure to other nonionizing radiation, initial encounter: Secondary | ICD-10-CM

## 2024-08-12 DIAGNOSIS — D229 Melanocytic nevi, unspecified: Secondary | ICD-10-CM

## 2024-08-12 DIAGNOSIS — L814 Other melanin hyperpigmentation: Secondary | ICD-10-CM | POA: Diagnosis not present

## 2024-08-12 DIAGNOSIS — L821 Other seborrheic keratosis: Secondary | ICD-10-CM

## 2024-08-12 NOTE — Patient Instructions (Signed)
 Wound Care Instructions  Cleanse wound gently with soap and water once a day then pat dry with clean gauze. Apply a thin coat of Petrolatum (petroleum jelly, Vaseline) over the wound (unless you have an allergy to this). We recommend that you use a new, sterile tube of Vaseline. Do not pick or remove scabs. Do not remove the yellow or white healing tissue from the base of the wound.  Cover the wound with fresh, clean, nonstick gauze and secure with paper tape. You may use Band-Aids in place of gauze and tape if the wound is small enough, but would recommend trimming much of the tape off as there is often too much. Sometimes Band-Aids can irritate the skin.  You should call the office for your biopsy report after 1 week if you have not already been contacted.  If you experience any problems, such as abnormal amounts of bleeding, swelling, significant bruising, significant pain, or evidence of infection, please call the office immediately.  FOR ADULT SURGERY PATIENTS: If you need something for pain relief you may take 1 extra strength Tylenol (acetaminophen) AND 2 Ibuprofen  (200mg  each) together every 4 hours as needed for pain. (do not take these if you are allergic to them or if you have a reason you should not take them.) Typically, you may only need pain medication for 1 to 3 days.        Sunscreen  Who needs sunscreen? Everyone. Sunscreen use can help prevent skin cancer by protecting you from the sun's harmful ultraviolet rays. Anyone can get skin cancer, regardless of age, gender or race. In fact, it is estimated that one in five Americans will develop skin cancer in their lifetime.  Sunscreen alone cannot fully protect you. In addition to wearing sunscreen, dermatologists recommend taking the following steps to protect your skin and find skin cancer early:  Seek shade when appropriate, remembering that the sun's rays are strongest between 10 a.m. and 2 p.m. If your shadow is shorter  than you are, seek shade. Dress to protect yourself from the sun by wearing a lightweight long-sleeved shirt, pants, a wide-brimmed hat and sunglasses, when possible.  Use extra caution near water, snow and sand as they reflect the damaging rays of the sun, which can increase your chance of sunburn.  Get vitamin D  safely through a healthy diet that may include vitamin supplements. Don't seek the sun. Avoid tanning beds. Ultraviolet light from the sun and tanning beds can cause skin cancer and wrinkling. If you want to look tan, you may wish to use a self-tanning product, but continue to use sunscreen with it.  When should I use sunscreen? Every day you go outside--even if you're just walking to and from your form of transportation. The sun emits harmful UV rays year-round. Even on cloudy days, up to 80 percent of the sun's harmful UV rays can penetrate your skin. Snow, sand and water increase the need for sunscreen because they reflect the sun's rays.  How much sunscreen should I use, and how often should I apply it? Most people only apply 25-50 percent of the recommended amount of sunscreen. Apply enough sunscreen to cover all exposed skin. Most adults need about 1 ounce -- or enough to fill a shot glass -- to fully cover their body.  Don't forget to apply to the tops of your feet, your neck, your ears and the top of your head. Apply sunscreen to dry skin 15 minutes before going outdoors.  Skin cancer also  can form on the lips. To protect your lips, apply a lip balm or lipstick that contains sunscreen with an SPF of 30 or higher.  When outdoors, reapply sunscreen approximately every two hours, or after swimming or sweating, according to the directions on the bottle.   Broad-spectrum sunscreens protect against both UVA and UVB rays. What is the difference between the rays? Sunlight consists of two types of harmful rays that reach the earth -- UVA rays and UVB rays. Overexposure to either can lead to  skin cancer. In addition to causing skin cancer, here's what each of these rays do:  UVA rays (or aging rays) can prematurely age your skin, causing wrinkles and age spots, and can pass through window glass. UVB rays (or burning rays) are the primary cause of sunburn and are blocked by window glass  There is no safe way to tan. Every time you tan, you damage your skin. As this damage builds, you speed up the aging of your skin and increase your risk for all types of skin cancer.  What is the difference between chemical and physical sunscreens? Chemical sunscreens work like a sponge, absorbing the sun's rays. They contain one or more of the following active ingredients: oxybenzone, avobenzone, octisalate, octocrylene, homosalate and octinoxate. These formulations tend to be easier to rub into the skin without leaving a white residue.   Physical sunscreens work like a shield, sitting sit on the surface of your skin and deflecting the sun's rays. They contain the active ingredients zinc oxide and/or titanium dioxide. Use this sunscreen if you have sensitive skin.   What type of sunscreen should I use? The best type of sunscreen is the one you will use again and again. Just make sure it offers broad-spectrum (UVA and UVB) protection, has an SPF of 30+, and is water-resistant. The kind of sunscreen you use is a matter of personal choice, and may vary depending on the area of the body to be protected. Available sunscreen options include lotions, creams, gels, ointments, wax sticks and sprays.  Recommended physical sunscreens for face: - Neutrogena Sheer Zinc - Aveeno Positively Mineral Sensitive - CeraVe Hydrating Mineral (also has a tinted version) - La Roche-Posay Anthelios Mineral Face (comes as a cream, lotion, light fluid, and there is also a tinted version).  - EltaMD UV Clear (also has a tinted version)  Recommended physical sunscreens for body: - Neutrogena Sheer Zinc Dry-Touch Sunscreen  Sensitive Skin Lotion Broad Spectrum SPF 50 - Aveeno Positively Mineral Sensitive Skin Sunscreen Broad Spectrum SPF 50 - La Roche-Posay Anthelios SPF 50 Mineral Sunscreen - Gentle Lotion - CeraVe Hydrating Mineral Sunscreen SPF 50  Recommended chemical sunscreens for face: - Anthelios UV Correct Face Sunscreen SPF 70 with Niacinamide - Neutrogena Clear Face Oil-Free SPF 50 with Helioplex - Neutrogena Sport Face Oil-Free SPF 70+ with Helioplex - Aveeno Protect + Hydrate Sunscreen For Face SPF 70 - La Roche-Posay Anthelios Light Fluid Sunscreen for Face SPF 60  Recommended chemical sunscreens for body: - Neutrogena Ultra Sheer Dry-Touch Sunscreen SPF 70 - Aveeno Protect + Hydrate Broad Spectrum All-Day Hydration SPF 60 (comes in a big pump) - La Roche-Posay Anthelios Melt-In Milk Sunscreen SPF 60   Melanoma ABCDEs  Melanoma is the most dangerous type of skin cancer, and is the leading cause of death from skin disease.  You are more likely to develop melanoma if you: Have light-colored skin, light-colored eyes, or red or blond hair Spend a lot of time in the sun  Tan regularly, either outdoors or in a tanning bed Have had blistering sunburns, especially during childhood Have a close family member who has had a melanoma Have atypical moles or large birthmarks  Early detection of melanoma is key since treatment is typically straightforward and cure rates are extremely high if we catch it early.   The first sign of melanoma is often a change in a mole or a new dark spot.  The ABCDE system is a way of remembering the signs of melanoma.  A for asymmetry:  The two halves do not match. B for border:  The edges of the growth are irregular. C for color:  A mixture of colors are present instead of an even brown color. D for diameter:  Melanomas are usually (but not always) greater than 6mm - the size of a pencil eraser. E for evolution:  The spot keeps changing in size, shape, and  color.  Please check your skin once per month between visits. You can use a small mirror in front and a large mirror behind you to keep an eye on the back side or your body.   If you see any new or changing lesions before your next follow-up, please call to schedule a visit.  Please continue daily skin protection including broad spectrum sunscreen SPF 30+ to sun-exposed areas, reapplying every 2 hours as needed when you're outdoors.     Due to recent changes in healthcare laws, you may see results of your pathology and/or laboratory studies on MyChart before the doctors have had a chance to review them. We understand that in some cases there may be results that are confusing or concerning to you. Please understand that not all results are received at the same time and often the doctors may need to interpret multiple results in order to provide you with the best plan of care or course of treatment. Therefore, we ask that you please give us  2 business days to thoroughly review all your results before contacting the office for clarification. Should we see a critical lab result, you will be contacted sooner.   If You Need Anything After Your Visit  If you have any questions or concerns for your doctor, please call our main line at [phone] and press option 4 to reach your doctor's medical assistant. If no one answers, please leave a voicemail as directed and we will return your call as soon as possible. Messages left after 4 pm will be answered the following business day.   You may also send us  a message via MyChart. We typically respond to MyChart messages within 1-2 business days.  For prescription refills, please ask your pharmacy to contact our office. Our fax number is [phone].  If you have an urgent issue when the clinic is closed that cannot wait until the next business day, you can page your doctor at the number below.    Please note that while we do our best to be available for  urgent issues outside of office hours, we are not available 24/7.   If you have an urgent issue and are unable to reach us , you may choose to seek medical care at your doctor's office, retail clinic, urgent care center, or emergency room.  If you have a medical emergency, please immediately call 911 or go to the emergency department.  Pager Numbers  - Dr. Hester: [phone]  - Dr. Jackquline: [phone]  - Dr. Claudene: [phone]   - Dr. Raymund: [phone]  In the  event of inclement weather, please call our main line at [phone] for an update on the status of any delays or closures.  Dermatology Medication Tips: Please keep the boxes that topical medications come in in order to help keep track of the instructions about where and how to use these. Pharmacies typically print the medication instructions only on the boxes and not directly on the medication tubes.   If your medication is too expensive, please contact our office at [phone] option 4 or send us  a message through MyChart.   We are unable to tell what your co-pay for medications will be in advance as this is different depending on your insurance coverage. However, we may be able to find a substitute medication at lower cost or fill out paperwork to get insurance to cover a needed medication.   If a prior authorization is required to get your medication covered by your insurance company, please allow us  1-2 business days to complete this process.  Drug prices often vary depending on where the prescription is filled and some pharmacies may offer cheaper prices.  The website www.goodrx.com contains coupons for medications through different pharmacies. The prices here do not account for what the cost may be with help from insurance (it may be cheaper with your insurance), but the website can give you the price if you did not use any insurance.  - You can print the associated coupon and take it with your prescription to  the pharmacy.  - You may also stop by our office during regular business hours and pick up a GoodRx coupon card.  - If you need your prescription sent electronically to a different pharmacy, notify our office through [facility] or by phone at [phone] option 4.     Si Usted Necesita Algo Despus de Su Visita  Tambin puede enviarnos un mensaje a travs de Clinical cytogeneticist. Por lo general respondemos a los mensajes de MyChart en el transcurso de 1 a 2 das hbiles.  Para renovar recetas, por favor pida a su farmacia que se ponga en contacto con nuestra oficina. Randi lakes de fax es [place] [phone].  Si tiene un asunto urgente cuando la clnica est cerrada y que no puede esperar hasta el siguiente da hbil, puede llamar/localizar a su doctor(a) al nmero que aparece a continuacin.   Por favor, tenga en cuenta que aunque hacemos todo lo posible para estar disponibles para asuntos urgentes fuera del horario de [place], no estamos disponibles las 24 horas del da, los 7 [address]  [address] de la [place].   Si tiene un problema urgente y no puede comunicarse con nosotros, puede optar por buscar atencin mdica  en el consultorio de su doctor(a), en una clnica privada, en un centro de atencin urgente o en una sala de emergencias.  Si tiene Engineer, drilling, por favor llame inmediatamente al 911 o vaya a la sala de emergencias.  Nmeros de bper  - Dr. Hester: [phone]  - Dra. Jackquline: [phone]  - Dr. Claudene: [phone]  - Dra. Kitts: [phone]  En caso de inclemencias del [place], por favor llame a nuestra lnea principal al [phone] para una actualizacin sobre el estado de cualquier retraso o cierre.  Consejos para la medicacin en dermatologa: Por favor, guarde las cajas en las que vienen los medicamentos de uso tpico para ayudarle a seguir las instrucciones sobre dnde y cmo usarlos. Las farmacias generalmente imprimen las instrucciones del medicamento slo en las  cajas y no directamente en los tubos del  medicamento.   Si su medicamento es muy caro, por favor, pngase en contacto con landry rieger llamando al [phone] y presione la opcin 4 o envenos un mensaje a travs de Clinical cytogeneticist.   No podemos decirle cul ser su copago por los medicamentos por adelantado ya que esto es diferente dependiendo de la cobertura de su seguro. Sin embargo, es posible que podamos encontrar un medicamento sustituto a Audiological scientist un formulario para que el seguro cubra el medicamento que se considera necesario.   Si se requiere una autorizacin previa para que su compaa de seguros malta su medicamento, por favor permtanos de 1 a 2 das hbiles para completar este proceso.  Los precios de los medicamentos varan con frecuencia dependiendo del Environmental consultant de dnde se surte la receta y alguna farmacias pueden ofrecer precios ms baratos.  El sitio web www.goodrx.com tiene cupones para medicamentos de Health and safety inspector. Los precios aqu no tienen en cuenta lo que podra costar con la ayuda del seguro (puede ser ms barato con su seguro), pero el sitio web puede darle el precio si no utiliz Tourist information centre manager.  - Puede imprimir el cupn correspondiente y llevarlo con su receta a la farmacia.  - Tambin puede pasar por nuestra oficina durante el horario de atencin regular y Education officer, museum una tarjeta de cupones de GoodRx.  - Si necesita que su receta se enve electrnicamente a una farmacia diferente, informe a nuestra oficina a travs de MyChart de [place] o por telfono llamando al [phone] y presione la opcin 4.

## 2024-08-12 NOTE — Progress Notes (Signed)
 "   Subjective   Gabrielle Malone is a 69 y.o. female who presents for the following: Total body skin exam for skin cancer screening and mole check. The patient has spots, moles and lesions to be evaluated, some may be new or changing and the patient may have concern these could be cancer.. Patient is new patient  Today patient reports: LOC on the nose  Review of Systems:    No other skin or systemic complaints except as noted in HPI or Assessment and Plan.  The following portions of the chart were reviewed this encounter and updated as appropriate: medications, allergies, medical history  Relevant Medical History:  Family history of skin cancer - Mother and father; Melanoma   Objective  (SKPE) Well appearing patient in no apparent distress; mood and affect are within normal limits. Examination was performed of the: Full Skin Examination: scalp, head, eyes, ears, nose, lips, neck, chest, axillae, abdomen, back, buttocks, bilateral upper extremities, bilateral lower extremities, hands, feet, fingers, toes, fingernails, and toenails.   Examination notable for: SKIN EXAM, Angioma(s): Scattered red vascular papule(s)  , Lentigo/lentigines: Scattered pigmented macules that are tan to brown in color and are somewhat non-uniform in shape and concentrated in the sun-exposed areas, Nevus/nevi: Scattered well-demarcated, regular, pigmented macule(s) and/or papule(s)  , Seborrheic Keratosis(es): Stuck-on appearing keratotic papule(s) on the trunk, none  irritated with redness, crusting, edema, and/or partial avulsion, Actinic Damage/Elastosis: chronic sun damage: dyspigmentation, telangiectasia, and wrinkling  Examination limited by: Undergarments and Patient deferred removal     Right Breast 4mm irregular brown macule    Assessment & Plan  (SKAP)   SKIN CANCER SCREENING PERFORMED TODAY.  BENIGN SKIN FINDINGS  - Lentigines  - Seborrheic keratoses  - Hemangiomas   - Nevus/Multiple Benign Nevi -  Reassurance provided regarding the benign appearance of lesions noted on exam today; no treatment is indicated in the absence of symptoms/changes. - Reinforced importance of photoprotective strategies including liberal and frequent sunscreen use of a broad-spectrum SPF 30 or greater, use of protective clothing, and sun avoidance for prevention of cutaneous malignancy and photoaging.  Counseled patient on the importance of regular self-skin monitoring as well as routine clinical skin examinations as scheduled.   ACTINIC DAMAGE - Chronic condition, secondary to cumulative UV/sun exposure - Recommend daily broad spectrum sunscreen SPF 30+ to sun-exposed areas, reapply every 2 hours as needed.  - Staying in the shade or wearing long sleeves, sun glasses (UVA+UVB protection) and wide brim hats (4-inch brim around the entire circumference of the hat) are also recommended for sun protection.  - Call for new or changing lesions.  Family hx of Melonoma - Hx of melanoma in mother and father - Hx of lung cancer in father   Was sun protection counseling provided?: Yes   Level of service outlined above   Patient instructions (SKPI)   Procedures, orders, diagnosis for this visit:  NEOPLASM OF UNCERTAIN BEHAVIOR Right Breast - Epidermal / dermal shaving  Lesion diameter (cm):  0.4 Informed consent: discussed and consent obtained   Timeout: patient name, date of birth, surgical site, and procedure verified   Procedure prep:  Patient was prepped and draped in usual sterile fashion Prep type:  Isopropyl alcohol Anesthesia: the lesion was anesthetized in a standard fashion   Anesthetic:  1% lidocaine  w/ epinephrine  1-100,000 buffered w/ 8.4% NaHCO3 Instrument used: DermaBlade   Hemostasis achieved with: pressure and aluminum chloride   Outcome: patient tolerated procedure well   Post-procedure details: wound care  instructions given    Specimen 1 - Surgical pathology Differential Diagnosis: Melanoma  vs dysplastic nevus   Check Margins: No  Neoplasm of uncertain behavior -     Epidermal / dermal shaving -     Surgical pathology; Standing    Return to clinic: Return in about 1 year (around 08/12/2025) for TBSE.  I, Emerick Ege, CMA am acting as scribe for Lauraine JAYSON Kanaris, MD.  Documentation: I have reviewed the above documentation for accuracy and completeness, and I agree with the above.  Lauraine JAYSON Kanaris, MD  "

## 2024-08-13 ENCOUNTER — Ambulatory Visit: Payer: Self-pay

## 2024-08-13 LAB — SURGICAL PATHOLOGY

## 2024-08-13 NOTE — Progress Notes (Signed)
Patient informed and voiced good understanding.

## 2024-08-19 ENCOUNTER — Other Ambulatory Visit

## 2025-08-12 ENCOUNTER — Ambulatory Visit
